# Patient Record
Sex: Female | Born: 1945
Health system: Southern US, Community
[De-identification: ages and names within clinical notes are randomized; demographics above are authoritative.]

## PROBLEM LIST (undated history)

## (undated) DIAGNOSIS — I10 Essential (primary) hypertension: Secondary | ICD-10-CM

## (undated) DIAGNOSIS — H269 Unspecified cataract: Secondary | ICD-10-CM

## (undated) DIAGNOSIS — J329 Chronic sinusitis, unspecified: Secondary | ICD-10-CM

## (undated) DIAGNOSIS — F32A Depression, unspecified: Secondary | ICD-10-CM

## (undated) DIAGNOSIS — J309 Allergic rhinitis, unspecified: Secondary | ICD-10-CM

## (undated) DIAGNOSIS — E559 Vitamin D deficiency, unspecified: Secondary | ICD-10-CM

## (undated) DIAGNOSIS — F419 Anxiety disorder, unspecified: Secondary | ICD-10-CM

## (undated) DIAGNOSIS — M1990 Unspecified osteoarthritis, unspecified site: Secondary | ICD-10-CM

## (undated) DIAGNOSIS — B019 Varicella without complication: Secondary | ICD-10-CM

## (undated) DIAGNOSIS — F329 Major depressive disorder, single episode, unspecified: Secondary | ICD-10-CM

## (undated) DIAGNOSIS — E785 Hyperlipidemia, unspecified: Secondary | ICD-10-CM

## (undated) DIAGNOSIS — J45909 Unspecified asthma, uncomplicated: Secondary | ICD-10-CM

## (undated) HISTORY — PX: TRIGGER FINGER RELEASE: SHX641

## (undated) HISTORY — DX: Varicella without complication: B01.9

## (undated) HISTORY — PX: WISDOM TOOTH EXTRACTION: SHX21

---

## 1998-03-05 ENCOUNTER — Other Ambulatory Visit: Admission: RE | Admit: 1998-03-05 | Discharge: 1998-03-05 | Payer: Self-pay | Admitting: Family Medicine

## 2000-09-30 ENCOUNTER — Other Ambulatory Visit: Admission: RE | Admit: 2000-09-30 | Discharge: 2000-09-30 | Payer: Self-pay | Admitting: Family Medicine

## 2000-10-13 ENCOUNTER — Encounter: Admission: RE | Admit: 2000-10-13 | Discharge: 2000-10-13 | Payer: Self-pay | Admitting: Family Medicine

## 2000-10-13 ENCOUNTER — Encounter: Payer: Self-pay | Admitting: Family Medicine

## 2000-10-18 ENCOUNTER — Encounter: Admission: RE | Admit: 2000-10-18 | Discharge: 2000-10-18 | Payer: Self-pay | Admitting: Family Medicine

## 2000-10-18 ENCOUNTER — Encounter: Payer: Self-pay | Admitting: Family Medicine

## 2002-06-13 ENCOUNTER — Other Ambulatory Visit: Admission: RE | Admit: 2002-06-13 | Discharge: 2002-06-13 | Payer: Self-pay | Admitting: Family Medicine

## 2006-06-20 ENCOUNTER — Ambulatory Visit (HOSPITAL_COMMUNITY): Admission: RE | Admit: 2006-06-20 | Discharge: 2006-06-20 | Payer: Self-pay | Admitting: Emergency Medicine

## 2006-06-20 ENCOUNTER — Encounter: Payer: Self-pay | Admitting: Vascular Surgery

## 2007-10-16 ENCOUNTER — Encounter: Admission: RE | Admit: 2007-10-16 | Discharge: 2007-10-16 | Payer: Self-pay | Admitting: Emergency Medicine

## 2007-10-17 ENCOUNTER — Encounter: Admission: RE | Admit: 2007-10-17 | Discharge: 2007-10-17 | Payer: Self-pay | Admitting: Sports Medicine

## 2010-06-24 ENCOUNTER — Other Ambulatory Visit
Admission: RE | Admit: 2010-06-24 | Discharge: 2010-06-24 | Payer: Self-pay | Source: Home / Self Care | Admitting: Family Medicine

## 2010-06-24 ENCOUNTER — Other Ambulatory Visit: Payer: Self-pay | Admitting: Family Medicine

## 2011-09-17 LAB — HM COLONOSCOPY

## 2011-10-06 ENCOUNTER — Other Ambulatory Visit: Payer: Self-pay | Admitting: Gastroenterology

## 2012-02-11 ENCOUNTER — Other Ambulatory Visit: Payer: Self-pay | Admitting: Family Medicine

## 2012-02-11 ENCOUNTER — Ambulatory Visit
Admission: RE | Admit: 2012-02-11 | Discharge: 2012-02-11 | Disposition: A | Discharge status: Home / Self Care | Payer: Medicare Other | Source: Ambulatory Visit | Attending: Family Medicine | Admitting: Family Medicine

## 2012-02-11 DIAGNOSIS — R221 Localized swelling, mass and lump, neck: Secondary | ICD-10-CM

## 2012-02-11 DIAGNOSIS — R220 Localized swelling, mass and lump, head: Secondary | ICD-10-CM

## 2012-02-11 DIAGNOSIS — M542 Cervicalgia: Secondary | ICD-10-CM

## 2012-02-29 ENCOUNTER — Ambulatory Visit: Payer: Medicare Other

## 2012-03-07 ENCOUNTER — Ambulatory Visit: Payer: Medicare Other | Attending: Family Medicine

## 2012-03-07 DIAGNOSIS — M25519 Pain in unspecified shoulder: Secondary | ICD-10-CM | POA: Insufficient documentation

## 2012-03-07 DIAGNOSIS — IMO0001 Reserved for inherently not codable concepts without codable children: Secondary | ICD-10-CM | POA: Insufficient documentation

## 2012-03-07 DIAGNOSIS — M542 Cervicalgia: Secondary | ICD-10-CM | POA: Insufficient documentation

## 2012-03-16 ENCOUNTER — Ambulatory Visit: Payer: Medicare Other

## 2012-03-23 ENCOUNTER — Ambulatory Visit: Payer: Medicare Other | Admitting: Physical Therapy

## 2012-03-30 ENCOUNTER — Ambulatory Visit: Payer: Medicare Other

## 2013-04-16 ENCOUNTER — Other Ambulatory Visit: Payer: Self-pay | Admitting: Gastroenterology

## 2013-04-16 ENCOUNTER — Ambulatory Visit
Admission: RE | Admit: 2013-04-16 | Discharge: 2013-04-16 | Disposition: A | Discharge status: Home / Self Care | Payer: Medicare Other | Source: Ambulatory Visit | Attending: Gastroenterology | Admitting: Gastroenterology

## 2013-04-16 DIAGNOSIS — R05 Cough: Secondary | ICD-10-CM

## 2014-10-22 DIAGNOSIS — Z0000 Encounter for general adult medical examination without abnormal findings: Secondary | ICD-10-CM | POA: Diagnosis not present

## 2014-10-22 DIAGNOSIS — E785 Hyperlipidemia, unspecified: Secondary | ICD-10-CM | POA: Diagnosis not present

## 2014-10-22 DIAGNOSIS — I10 Essential (primary) hypertension: Secondary | ICD-10-CM | POA: Diagnosis not present

## 2014-10-22 DIAGNOSIS — M810 Age-related osteoporosis without current pathological fracture: Secondary | ICD-10-CM | POA: Diagnosis not present

## 2014-10-22 DIAGNOSIS — F329 Major depressive disorder, single episode, unspecified: Secondary | ICD-10-CM | POA: Diagnosis not present

## 2014-10-22 DIAGNOSIS — Z23 Encounter for immunization: Secondary | ICD-10-CM | POA: Diagnosis not present

## 2014-11-15 DIAGNOSIS — R7989 Other specified abnormal findings of blood chemistry: Secondary | ICD-10-CM | POA: Diagnosis not present

## 2014-11-15 DIAGNOSIS — F331 Major depressive disorder, recurrent, moderate: Secondary | ICD-10-CM | POA: Diagnosis not present

## 2014-11-15 DIAGNOSIS — R5383 Other fatigue: Secondary | ICD-10-CM | POA: Diagnosis not present

## 2014-11-15 DIAGNOSIS — F4320 Adjustment disorder, unspecified: Secondary | ICD-10-CM | POA: Diagnosis not present

## 2014-11-27 ENCOUNTER — Other Ambulatory Visit: Payer: Self-pay | Admitting: Family Medicine

## 2014-11-27 ENCOUNTER — Ambulatory Visit
Admission: RE | Admit: 2014-11-27 | Discharge: 2014-11-27 | Disposition: A | Discharge status: Home / Self Care | Payer: Commercial Managed Care - HMO | Source: Ambulatory Visit | Attending: Family Medicine | Admitting: Family Medicine

## 2014-11-27 DIAGNOSIS — R059 Cough, unspecified: Secondary | ICD-10-CM

## 2014-11-27 DIAGNOSIS — R05 Cough: Secondary | ICD-10-CM | POA: Diagnosis not present

## 2014-11-27 DIAGNOSIS — R748 Abnormal levels of other serum enzymes: Secondary | ICD-10-CM | POA: Diagnosis not present

## 2014-11-27 DIAGNOSIS — F329 Major depressive disorder, single episode, unspecified: Secondary | ICD-10-CM | POA: Diagnosis not present

## 2014-11-27 DIAGNOSIS — R630 Anorexia: Secondary | ICD-10-CM | POA: Diagnosis not present

## 2014-12-02 ENCOUNTER — Ambulatory Visit
Admission: RE | Admit: 2014-12-02 | Discharge: 2014-12-02 | Disposition: A | Discharge status: Home / Self Care | Payer: Commercial Managed Care - HMO | Source: Ambulatory Visit | Attending: Family Medicine | Admitting: Family Medicine

## 2014-12-02 DIAGNOSIS — R748 Abnormal levels of other serum enzymes: Secondary | ICD-10-CM | POA: Diagnosis not present

## 2014-12-25 DIAGNOSIS — R42 Dizziness and giddiness: Secondary | ICD-10-CM | POA: Diagnosis not present

## 2014-12-25 DIAGNOSIS — R1030 Lower abdominal pain, unspecified: Secondary | ICD-10-CM | POA: Diagnosis not present

## 2014-12-25 DIAGNOSIS — F329 Major depressive disorder, single episode, unspecified: Secondary | ICD-10-CM | POA: Diagnosis not present

## 2014-12-25 DIAGNOSIS — F419 Anxiety disorder, unspecified: Secondary | ICD-10-CM | POA: Diagnosis not present

## 2015-01-01 ENCOUNTER — Other Ambulatory Visit: Payer: Self-pay | Admitting: Family Medicine

## 2015-01-01 DIAGNOSIS — R1030 Lower abdominal pain, unspecified: Secondary | ICD-10-CM

## 2015-02-21 DIAGNOSIS — H524 Presbyopia: Secondary | ICD-10-CM | POA: Diagnosis not present

## 2015-02-21 DIAGNOSIS — H5210 Myopia, unspecified eye: Secondary | ICD-10-CM | POA: Diagnosis not present

## 2015-04-04 DIAGNOSIS — I10 Essential (primary) hypertension: Secondary | ICD-10-CM | POA: Diagnosis not present

## 2015-04-04 DIAGNOSIS — R109 Unspecified abdominal pain: Secondary | ICD-10-CM | POA: Diagnosis not present

## 2015-04-04 DIAGNOSIS — R42 Dizziness and giddiness: Secondary | ICD-10-CM | POA: Diagnosis not present

## 2015-04-04 DIAGNOSIS — R51 Headache: Secondary | ICD-10-CM | POA: Diagnosis not present

## 2015-04-04 DIAGNOSIS — R413 Other amnesia: Secondary | ICD-10-CM | POA: Diagnosis not present

## 2015-04-04 DIAGNOSIS — E559 Vitamin D deficiency, unspecified: Secondary | ICD-10-CM | POA: Diagnosis not present

## 2015-04-04 DIAGNOSIS — E785 Hyperlipidemia, unspecified: Secondary | ICD-10-CM | POA: Diagnosis not present

## 2015-04-04 DIAGNOSIS — Z23 Encounter for immunization: Secondary | ICD-10-CM | POA: Diagnosis not present

## 2015-04-04 DIAGNOSIS — F329 Major depressive disorder, single episode, unspecified: Secondary | ICD-10-CM | POA: Diagnosis not present

## 2015-05-13 DIAGNOSIS — F329 Major depressive disorder, single episode, unspecified: Secondary | ICD-10-CM | POA: Diagnosis not present

## 2015-05-13 DIAGNOSIS — H2512 Age-related nuclear cataract, left eye: Secondary | ICD-10-CM | POA: Diagnosis not present

## 2015-05-13 DIAGNOSIS — F419 Anxiety disorder, unspecified: Secondary | ICD-10-CM | POA: Diagnosis not present

## 2015-05-13 DIAGNOSIS — H2511 Age-related nuclear cataract, right eye: Secondary | ICD-10-CM | POA: Diagnosis not present

## 2015-05-13 DIAGNOSIS — H25011 Cortical age-related cataract, right eye: Secondary | ICD-10-CM | POA: Diagnosis not present

## 2015-05-13 DIAGNOSIS — I10 Essential (primary) hypertension: Secondary | ICD-10-CM | POA: Diagnosis not present

## 2015-05-13 DIAGNOSIS — R0602 Shortness of breath: Secondary | ICD-10-CM | POA: Diagnosis not present

## 2015-05-13 DIAGNOSIS — H25012 Cortical age-related cataract, left eye: Secondary | ICD-10-CM | POA: Diagnosis not present

## 2015-05-22 DIAGNOSIS — J329 Chronic sinusitis, unspecified: Secondary | ICD-10-CM | POA: Diagnosis not present

## 2015-06-08 HISTORY — PX: CATARACT EXTRACTION, BILATERAL: SHX1313

## 2015-06-10 ENCOUNTER — Other Ambulatory Visit: Payer: Self-pay | Admitting: Family Medicine

## 2015-06-10 DIAGNOSIS — D3002 Benign neoplasm of left kidney: Secondary | ICD-10-CM

## 2015-06-13 DIAGNOSIS — J069 Acute upper respiratory infection, unspecified: Secondary | ICD-10-CM | POA: Diagnosis not present

## 2015-06-16 DIAGNOSIS — J329 Chronic sinusitis, unspecified: Secondary | ICD-10-CM | POA: Diagnosis not present

## 2015-07-07 DIAGNOSIS — H25811 Combined forms of age-related cataract, right eye: Secondary | ICD-10-CM | POA: Diagnosis not present

## 2015-07-07 DIAGNOSIS — H5211 Myopia, right eye: Secondary | ICD-10-CM | POA: Diagnosis not present

## 2015-07-07 DIAGNOSIS — H5201 Hypermetropia, right eye: Secondary | ICD-10-CM | POA: Diagnosis not present

## 2015-07-07 DIAGNOSIS — Z961 Presence of intraocular lens: Secondary | ICD-10-CM | POA: Diagnosis not present

## 2015-07-07 DIAGNOSIS — H2511 Age-related nuclear cataract, right eye: Secondary | ICD-10-CM | POA: Diagnosis not present

## 2015-07-08 DIAGNOSIS — H2512 Age-related nuclear cataract, left eye: Secondary | ICD-10-CM | POA: Diagnosis not present

## 2015-07-09 HISTORY — PX: CATARACT EXTRACTION, BILATERAL: SHX1313

## 2015-07-15 DIAGNOSIS — R69 Illness, unspecified: Secondary | ICD-10-CM | POA: Diagnosis not present

## 2015-07-17 ENCOUNTER — Ambulatory Visit
Admission: RE | Admit: 2015-07-17 | Discharge: 2015-07-17 | Disposition: A | Discharge status: Home / Self Care | Payer: Commercial Managed Care - HMO | Source: Ambulatory Visit | Attending: Family Medicine | Admitting: Family Medicine

## 2015-07-17 DIAGNOSIS — N2889 Other specified disorders of kidney and ureter: Secondary | ICD-10-CM | POA: Diagnosis not present

## 2015-07-17 DIAGNOSIS — D3002 Benign neoplasm of left kidney: Secondary | ICD-10-CM

## 2015-07-28 DIAGNOSIS — H25812 Combined forms of age-related cataract, left eye: Secondary | ICD-10-CM | POA: Diagnosis not present

## 2015-07-28 DIAGNOSIS — H524 Presbyopia: Secondary | ICD-10-CM | POA: Diagnosis not present

## 2015-07-28 DIAGNOSIS — H2512 Age-related nuclear cataract, left eye: Secondary | ICD-10-CM | POA: Diagnosis not present

## 2015-07-28 DIAGNOSIS — H5201 Hypermetropia, right eye: Secondary | ICD-10-CM | POA: Diagnosis not present

## 2015-07-28 DIAGNOSIS — H5212 Myopia, left eye: Secondary | ICD-10-CM | POA: Diagnosis not present

## 2015-07-28 DIAGNOSIS — Z961 Presence of intraocular lens: Secondary | ICD-10-CM | POA: Diagnosis not present

## 2015-08-04 DIAGNOSIS — H524 Presbyopia: Secondary | ICD-10-CM | POA: Diagnosis not present

## 2015-08-04 DIAGNOSIS — Z961 Presence of intraocular lens: Secondary | ICD-10-CM | POA: Diagnosis not present

## 2015-08-04 DIAGNOSIS — H5201 Hypermetropia, right eye: Secondary | ICD-10-CM | POA: Diagnosis not present

## 2015-08-19 DIAGNOSIS — H5203 Hypermetropia, bilateral: Secondary | ICD-10-CM | POA: Diagnosis not present

## 2015-08-19 DIAGNOSIS — H5212 Myopia, left eye: Secondary | ICD-10-CM | POA: Diagnosis not present

## 2015-08-19 DIAGNOSIS — Z961 Presence of intraocular lens: Secondary | ICD-10-CM | POA: Diagnosis not present

## 2015-08-19 DIAGNOSIS — R69 Illness, unspecified: Secondary | ICD-10-CM | POA: Diagnosis not present

## 2015-08-19 DIAGNOSIS — H524 Presbyopia: Secondary | ICD-10-CM | POA: Diagnosis not present

## 2015-12-08 ENCOUNTER — Telehealth: Payer: Self-pay | Admitting: Cardiology

## 2015-12-08 DIAGNOSIS — L299 Pruritus, unspecified: Secondary | ICD-10-CM | POA: Diagnosis not present

## 2015-12-08 DIAGNOSIS — R5383 Other fatigue: Secondary | ICD-10-CM | POA: Diagnosis not present

## 2015-12-08 DIAGNOSIS — L309 Dermatitis, unspecified: Secondary | ICD-10-CM | POA: Diagnosis not present

## 2015-12-08 NOTE — Telephone Encounter (Signed)
Received records from [place] for appointment on 02/19/16 with Dr Ellyn Hack.  Records given to [facility] (medical records) for Dr Allison Quarry schedule on 02/19/16. lp

## 2015-12-10 DIAGNOSIS — L309 Dermatitis, unspecified: Secondary | ICD-10-CM | POA: Diagnosis not present

## 2015-12-15 DIAGNOSIS — L309 Dermatitis, unspecified: Secondary | ICD-10-CM | POA: Diagnosis not present

## 2015-12-15 DIAGNOSIS — L821 Other seborrheic keratosis: Secondary | ICD-10-CM | POA: Diagnosis not present

## 2015-12-18 ENCOUNTER — Observation Stay (HOSPITAL_COMMUNITY)
Admission: EM | Admit: 2015-12-18 | Discharge: 2015-12-20 | Disposition: A | Discharge status: Home / Self Care | Payer: Commercial Managed Care - HMO | Attending: Internal Medicine | Admitting: Internal Medicine

## 2015-12-18 ENCOUNTER — Encounter (HOSPITAL_COMMUNITY): Payer: Self-pay | Admitting: Emergency Medicine

## 2015-12-18 ENCOUNTER — Observation Stay (HOSPITAL_COMMUNITY): Payer: Commercial Managed Care - HMO

## 2015-12-18 ENCOUNTER — Emergency Department (HOSPITAL_COMMUNITY): Payer: Commercial Managed Care - HMO

## 2015-12-18 DIAGNOSIS — R079 Chest pain, unspecified: Secondary | ICD-10-CM | POA: Diagnosis present

## 2015-12-18 DIAGNOSIS — E785 Hyperlipidemia, unspecified: Secondary | ICD-10-CM | POA: Diagnosis present

## 2015-12-18 DIAGNOSIS — R0789 Other chest pain: Secondary | ICD-10-CM | POA: Diagnosis not present

## 2015-12-18 DIAGNOSIS — K219 Gastro-esophageal reflux disease without esophagitis: Secondary | ICD-10-CM | POA: Diagnosis not present

## 2015-12-18 DIAGNOSIS — N183 Chronic kidney disease, stage 3 (moderate): Secondary | ICD-10-CM | POA: Insufficient documentation

## 2015-12-18 DIAGNOSIS — I200 Unstable angina: Secondary | ICD-10-CM | POA: Diagnosis not present

## 2015-12-18 DIAGNOSIS — Z7982 Long term (current) use of aspirin: Secondary | ICD-10-CM | POA: Insufficient documentation

## 2015-12-18 DIAGNOSIS — F419 Anxiety disorder, unspecified: Secondary | ICD-10-CM | POA: Insufficient documentation

## 2015-12-18 DIAGNOSIS — R21 Rash and other nonspecific skin eruption: Secondary | ICD-10-CM | POA: Diagnosis present

## 2015-12-18 DIAGNOSIS — J45909 Unspecified asthma, uncomplicated: Secondary | ICD-10-CM | POA: Diagnosis present

## 2015-12-18 DIAGNOSIS — I10 Essential (primary) hypertension: Secondary | ICD-10-CM | POA: Diagnosis present

## 2015-12-18 DIAGNOSIS — I129 Hypertensive chronic kidney disease with stage 1 through stage 4 chronic kidney disease, or unspecified chronic kidney disease: Secondary | ICD-10-CM | POA: Diagnosis not present

## 2015-12-18 DIAGNOSIS — Z79899 Other long term (current) drug therapy: Secondary | ICD-10-CM | POA: Insufficient documentation

## 2015-12-18 DIAGNOSIS — I2510 Atherosclerotic heart disease of native coronary artery without angina pectoris: Secondary | ICD-10-CM | POA: Insufficient documentation

## 2015-12-18 DIAGNOSIS — Z9114 Patient's other noncompliance with medication regimen: Secondary | ICD-10-CM | POA: Insufficient documentation

## 2015-12-18 DIAGNOSIS — F329 Major depressive disorder, single episode, unspecified: Secondary | ICD-10-CM | POA: Insufficient documentation

## 2015-12-18 DIAGNOSIS — M1990 Unspecified osteoarthritis, unspecified site: Secondary | ICD-10-CM | POA: Diagnosis present

## 2015-12-18 DIAGNOSIS — Z7722 Contact with and (suspected) exposure to environmental tobacco smoke (acute) (chronic): Secondary | ICD-10-CM | POA: Diagnosis not present

## 2015-12-18 DIAGNOSIS — R0602 Shortness of breath: Secondary | ICD-10-CM | POA: Diagnosis not present

## 2015-12-18 DIAGNOSIS — R5383 Other fatigue: Secondary | ICD-10-CM | POA: Diagnosis present

## 2015-12-18 DIAGNOSIS — Z8249 Family history of ischemic heart disease and other diseases of the circulatory system: Secondary | ICD-10-CM | POA: Diagnosis not present

## 2015-12-18 HISTORY — DX: Allergic rhinitis, unspecified: J30.9

## 2015-12-18 HISTORY — DX: Essential (primary) hypertension: I10

## 2015-12-18 HISTORY — DX: Hyperlipidemia, unspecified: E78.5

## 2015-12-18 HISTORY — DX: Unspecified osteoarthritis, unspecified site: M19.90

## 2015-12-18 HISTORY — DX: Unspecified cataract: H26.9

## 2015-12-18 HISTORY — DX: Vitamin D deficiency, unspecified: E55.9

## 2015-12-18 HISTORY — DX: Chronic sinusitis, unspecified: J32.9

## 2015-12-18 HISTORY — DX: Depression, unspecified: F32.A

## 2015-12-18 HISTORY — DX: Anxiety disorder, unspecified: F41.9

## 2015-12-18 HISTORY — DX: Major depressive disorder, single episode, unspecified: F32.9

## 2015-12-18 HISTORY — DX: Unspecified asthma, uncomplicated: J45.909

## 2015-12-18 LAB — BASIC METABOLIC PANEL
ANION GAP: 10 (ref 5–15)
ANION GAP: 13 (ref 5–15)
BUN: 12 mg/dL (ref 6–20)
BUN: 15 mg/dL (ref 6–20)
CALCIUM: 9.4 mg/dL (ref 8.9–10.3)
CHLORIDE: 99 mmol/L — AB (ref 101–111)
CO2: 23 mmol/L (ref 22–32)
CO2: 20 mmol/L — AB (ref 22–32)
Calcium: 9.3 mg/dL (ref 8.9–10.3)
Chloride: 104 mmol/L (ref 101–111)
Creatinine, Ser: 0.96 mg/dL (ref 0.44–1.00)
Creatinine, Ser: 1.2 mg/dL — ABNORMAL HIGH (ref 0.44–1.00)
GFR calc non Af Amer: 44 mL/min — ABNORMAL LOW (ref >60)
GFR, EST AFRICAN AMERICAN: 51 mL/min — AB (ref >60)
GFR, EST NON AFRICAN AMERICAN: 58 mL/min — AB (ref >60)
Glucose, Bld: 97 mg/dL (ref 65–99)
Glucose, Bld: 164 mg/dL — ABNORMAL HIGH (ref 65–99)
POTASSIUM: 4 mmol/L (ref 3.5–5.1)
Potassium: 4.3 mmol/L (ref 3.5–5.1)
SODIUM: 132 mmol/L — AB (ref 135–145)
Sodium: 137 mmol/L (ref 135–145)

## 2015-12-18 LAB — CBC
HCT: 41.9 % (ref 36.0–46.0)
HEMATOCRIT: 38.5 % (ref 36.0–46.0)
HEMOGLOBIN: 14 g/dL (ref 12.0–15.0)
HEMOGLOBIN: 13.1 g/dL (ref 12.0–15.0)
MCH: 30.2 pg (ref 26.0–34.0)
MCH: 30.7 pg (ref 26.0–34.0)
MCHC: 33.4 g/dL (ref 30.0–36.0)
MCHC: 34 g/dL (ref 30.0–36.0)
MCV: 90.3 fL (ref 78.0–100.0)
MCV: 90.2 fL (ref 78.0–100.0)
Platelets: 307 10*3/uL (ref 150–400)
Platelets: 290 10*3/uL (ref 150–400)
RBC: 4.64 MIL/uL (ref 3.87–5.11)
RBC: 4.27 MIL/uL (ref 3.87–5.11)
RDW: 13.4 % (ref 11.5–15.5)
RDW: 13.5 % (ref 11.5–15.5)
WBC: 10.1 10*3/uL (ref 4.0–10.5)
WBC: 16.6 10*3/uL — AB (ref 4.0–10.5)

## 2015-12-18 LAB — TROPONIN I: TROPONIN I: 0.03 ng/mL — AB (ref <0.03)

## 2015-12-18 LAB — PROTIME-INR
INR: 1.15 (ref 0.00–1.49)
PROTHROMBIN TIME: 14.9 s (ref 11.6–15.2)

## 2015-12-18 LAB — I-STAT TROPONIN, ED: TROPONIN I, POC: 0 ng/mL (ref 0.00–0.08)

## 2015-12-18 LAB — BRAIN NATRIURETIC PEPTIDE: B Natriuretic Peptide: 17.5 pg/mL (ref 0.0–100.0)

## 2015-12-18 MED ORDER — HYPROMELLOSE (GONIOSCOPIC) 2.5 % OP SOLN
1.0000 [drp] | Freq: Every day | OPHTHALMIC | Status: DC
  Administered 2015-12-18: 1 [drp] via OPHTHALMIC
  Filled 2015-12-18: qty 15

## 2015-12-18 MED ORDER — MORPHINE SULFATE (PF) 2 MG/ML IV SOLN: 0.5000 mg | INTRAVENOUS | Status: DC | PRN

## 2015-12-18 MED ORDER — METHYLPREDNISOLONE SODIUM SUCC 125 MG IJ SOLR
125.0000 mg | Freq: Once | INTRAMUSCULAR | Status: AC
  Administered 2015-12-18: 125 mg via INTRAVENOUS
  Filled 2015-12-18: qty 2

## 2015-12-18 MED ORDER — ATORVASTATIN CALCIUM 20 MG PO TABS
20.0000 mg | ORAL_TABLET | Freq: Every day | ORAL | Status: DC
  Administered 2015-12-18: 20 mg via ORAL
  Filled 2015-12-18 (×2): qty 1

## 2015-12-18 MED ORDER — FLUOXETINE HCL 20 MG PO CAPS
40.0000 mg | ORAL_CAPSULE | Freq: Every day | ORAL | Status: DC
  Administered 2015-12-18 – 2015-12-20 (×3): 40 mg via ORAL
  Filled 2015-12-18 (×4): qty 2

## 2015-12-18 MED ORDER — ASPIRIN 81 MG PO CHEW
81.0000 mg | CHEWABLE_TABLET | ORAL | Status: AC
  Administered 2015-12-19: 81 mg via ORAL
  Filled 2015-12-18: qty 1

## 2015-12-18 MED ORDER — BUPROPION HCL ER (XL) 300 MG PO TB24
300.0000 mg | ORAL_TABLET | Freq: Every day | ORAL | Status: DC
  Administered 2015-12-18 – 2015-12-20 (×3): 300 mg via ORAL
  Filled 2015-12-18 (×3): qty 1

## 2015-12-18 MED ORDER — ONDANSETRON HCL 4 MG/2ML IJ SOLN
4.0000 mg | Freq: Once | INTRAMUSCULAR | Status: AC
  Administered 2015-12-18: 4 mg via INTRAVENOUS
  Filled 2015-12-18: qty 2

## 2015-12-18 MED ORDER — ALBUTEROL SULFATE (2.5 MG/3ML) 0.083% IN NEBU: 2.5000 mg | INHALATION_SOLUTION | RESPIRATORY_TRACT | Status: AC | PRN

## 2015-12-18 MED ORDER — ENOXAPARIN SODIUM 40 MG/0.4ML ~~LOC~~ SOLN
40.0000 mg | SUBCUTANEOUS | Status: DC
  Administered 2015-12-18 – 2015-12-19 (×2): 40 mg via SUBCUTANEOUS
  Filled 2015-12-18 (×2): qty 0.4

## 2015-12-18 MED ORDER — ASPIRIN EC 325 MG PO TBEC
325.0000 mg | DELAYED_RELEASE_TABLET | Freq: Every day | ORAL | Status: DC
  Administered 2015-12-18: 325 mg via ORAL
  Filled 2015-12-18 (×3): qty 1

## 2015-12-18 MED ORDER — SODIUM CHLORIDE 0.9% FLUSH
3.0000 mL | Freq: Two times a day (BID) | INTRAVENOUS | Status: DC
  Administered 2015-12-18 – 2015-12-19 (×2): 3 mL via INTRAVENOUS

## 2015-12-18 MED ORDER — ADULT MULTIVITAMIN W/MINERALS CH
1.0000 | ORAL_TABLET | Freq: Every day | ORAL | Status: DC
  Administered 2015-12-18 – 2015-12-20 (×3): 1 via ORAL
  Filled 2015-12-18 (×3): qty 1

## 2015-12-18 MED ORDER — NITROGLYCERIN 0.4 MG SL SUBL
0.4000 mg | SUBLINGUAL_TABLET | SUBLINGUAL | Status: DC | PRN
  Administered 2015-12-18 (×2): 0.4 mg via SUBLINGUAL
  Filled 2015-12-18: qty 1

## 2015-12-18 MED ORDER — GI COCKTAIL ~~LOC~~
30.0000 mL | Freq: Once | ORAL | Status: AC
  Administered 2015-12-18: 30 mL via ORAL
  Filled 2015-12-18: qty 30

## 2015-12-18 MED ORDER — ACETAMINOPHEN 325 MG PO TABS
650.0000 mg | ORAL_TABLET | ORAL | Status: DC | PRN
  Administered 2015-12-20: 650 mg via ORAL
  Filled 2015-12-18: qty 2

## 2015-12-18 MED ORDER — SODIUM CHLORIDE 0.9% FLUSH: 3.0000 mL | INTRAVENOUS | Status: DC | PRN

## 2015-12-18 MED ORDER — AMLODIPINE BESYLATE 2.5 MG PO TABS
2.5000 mg | ORAL_TABLET | Freq: Every day | ORAL | Status: DC
  Administered 2015-12-18 – 2015-12-20 (×3): 2.5 mg via ORAL
  Filled 2015-12-18 (×3): qty 1

## 2015-12-18 MED ORDER — ALBUTEROL (5 MG/ML) CONTINUOUS INHALATION SOLN
10.0000 mg/h | INHALATION_SOLUTION | RESPIRATORY_TRACT | Status: AC
  Administered 2015-12-18: 10 mg/h via RESPIRATORY_TRACT
  Filled 2015-12-18 (×2): qty 20

## 2015-12-18 MED ORDER — ALBUTEROL SULFATE HFA 108 (90 BASE) MCG/ACT IN AERS: 2.0000 | INHALATION_SPRAY | Freq: Four times a day (QID) | RESPIRATORY_TRACT | Status: DC | PRN

## 2015-12-18 MED ORDER — SODIUM CHLORIDE 0.9 % IV BOLUS (SEPSIS)
250.0000 mL | Freq: Once | INTRAVENOUS | Status: AC
  Administered 2015-12-18: 250 mL via INTRAVENOUS

## 2015-12-18 MED ORDER — CARBOXYMETHYLCELLULOSE SODIUM 0.5 % OP SOLN: 1.0000 [drp] | Freq: Every day | OPHTHALMIC | Status: DC

## 2015-12-18 MED ORDER — ONDANSETRON HCL 4 MG/2ML IJ SOLN: 4.0000 mg | Freq: Four times a day (QID) | INTRAMUSCULAR | Status: DC | PRN

## 2015-12-18 MED ORDER — SODIUM CHLORIDE 0.9 % IV SOLN: 250.0000 mL | INTRAVENOUS | Status: DC | PRN

## 2015-12-18 MED ORDER — SODIUM CHLORIDE 0.9 % WEIGHT BASED INFUSION
1.0000 mL/kg/h | INTRAVENOUS | Status: DC
  Administered 2015-12-18 – 2015-12-19 (×2): 1 mL/kg/h via INTRAVENOUS

## 2015-12-18 MED ORDER — MECLIZINE HCL 25 MG PO TABS: 25.0000 mg | ORAL_TABLET | Freq: Three times a day (TID) | ORAL | Status: DC | PRN

## 2015-12-18 MED ORDER — GI COCKTAIL ~~LOC~~
30.0000 mL | Freq: Four times a day (QID) | ORAL | Status: DC | PRN
  Filled 2015-12-18: qty 30

## 2015-12-18 MED ORDER — ALPRAZOLAM 0.25 MG PO TABS: 0.2500 mg | ORAL_TABLET | Freq: Two times a day (BID) | ORAL | Status: DC | PRN

## 2015-12-18 MED ORDER — PANTOPRAZOLE SODIUM 40 MG PO TBEC
40.0000 mg | DELAYED_RELEASE_TABLET | Freq: Every day | ORAL | Status: DC
  Administered 2015-12-18 – 2015-12-20 (×3): 40 mg via ORAL
  Filled 2015-12-18 (×3): qty 1

## 2015-12-18 NOTE — H&P (Signed)
History and Physical    Brenda Bautista [id number] DOB: 15-Oct-1945 DOA: 12/18/2015   PCP: Lujean Amel, MD   Patient coming from/Resides with: Private residence/lives alone  Chief Complaint: Chest pain  HPI: Brenda Bautista is a 70 y.o. female with medical history significant for hypertension, asthma, osteoarthritis, GERD and dyslipidemia who presents to the hospital complaining of left-sided chest pain ongoing for about 3-4 weeks. Patient has been having chest pain under the left breast radiating to the left axilla down the left arm and into the left neck as well as through her back for about 3-4 weeks. Episodes of chest pain and been associated with dizziness and diaphoresis as well as progressive shortness of breath. Chest pain duration is anywhere from a few minutes to several hours with no particular pattern. In addition more recently patient has had a bitter taste to her mouth. She denies any potential injury or recent issues with lifting a heavy item. She is not had any typical asthma symptoms such as wheezing or coughing or fevers or chills. Patient is currently chest pain-free after receiving nitroglycerin in the ER. Of note patient had significant drop in blood pressure after administration of nitroglycerin requiring a 250 mL normal saline bolus. Patient reports is not a good drinker of fluids at home.  ED Course:  Vital signs: PO temp 90.8-BP 154/101-pulse 61-respirations 11-room air saturations 93% 2 view chest x-ray: Cardiomegaly without evidence of acute cardiopulmonary disease Lab data: Sodium 137, potassium 4.3, BUN 12, creatinine 0.96, BNP 17.5, poc troponin less than 0.00, troponin less than 0.03, white count normal differential not obtained, hemoglobin 14, platelets 307,000, glucose 97 Medications and treatments: Continuous Proventil neb 10 mg per hour 1, sublingual nitroglycerin 0.4 mg 2, Solu-Medrol 125 mg IV 1, Zofran 4 mg IV 1   Review of Systems:  In  addition to the HPI above,  No Fever-chills, myalgias or other constitutional symptoms No Headache, changes with Vision or hearing, new weakness, tingling, numbness in any extremity, No problems swallowing food or Liquids, indigestion/reflux although recently has reported bitter taste in her mouth No Cough or palpitations, orthopnea  No Abdominal pain, N/V; no melena or hematochezia, no dark tarry stools No dysuria, hematuria or flank pain No new skin rashes, lesions, masses or bruises, No new joints pains-aches No recent weight gain or loss No polyuria, polydypsia or polyphagia,   Past Medical History  Diagnosis Date  . Hypertension   . Arthritis   . Asthma   . Hyperlipemia   . Depression   . Anxiety   . Cataract   . Allergic rhinitis   . Vitamin D deficiency   . Sinusitis     History reviewed. No pertinent past surgical history.  Social History   Social History  . Marital Status: Widowed     Spouse Name: N/A  . Number of Children: N/A  . Years of Education: N/A   Occupational History  . Not on file.   Social History Main Topics  . Smoking status: Never Smoker Although father was heavy smoker and has been smoked as well   . Smokeless tobacco: Never Used  . Alcohol Use: No  . Drug Use: No  . Sexual Activity: Not on file   Other Topics Concern  . Not on file   Social History Narrative  . No narrative on file    Mobility: Without assistive devices Work history: Homemaker   Allergies  Allergen Reactions  . Codeine   . Bactrim [Sulfamethoxazole-Trimethoprim] Rash  .  Flagyl [Metronidazole] Rash  . Meloxicam Other (See Comments)    Abdominal pain     No family history on file. Family history reviewed-Patient reports mother had myocardial infarction and subsequent CABG procedure in her 77s, also a pacemaker-mother was a smoker  Prior to Admission medications   Medication Sig Start Date End Date Taking? Authorizing Provider  albuterol (PROVENTIL  HFA;VENTOLIN HFA) 108 (90 Base) MCG/ACT inhaler Inhale 2 puffs into the lungs every 6 (six) hours as needed for wheezing or shortness of breath.   Yes Historical Provider, MD  ALPRAZolam (XANAX) 0.25 MG tablet Take 0.25 mg by mouth 2 (two) times daily as needed for anxiety.   Yes Historical Provider, MD  amLODipine (NORVASC) 2.5 MG tablet Take 2.5 mg by mouth daily.   Yes Historical Provider, MD  aspirin 81 MG tablet Take 81 mg by mouth daily.   Yes Historical Provider, MD  atorvastatin (LIPITOR) 20 MG tablet Take 20 mg by mouth daily.   Yes Historical Provider, MD  buPROPion (WELLBUTRIN XL) 300 MG 24 hr tablet Take 300 mg by mouth daily.   Yes Historical Provider, MD  carboxymethylcellulose (REFRESH PLUS) 0.5 % SOLN Place 1 drop into both eyes at bedtime.   Yes Historical Provider, MD  diphenhydrAMINE (BENADRYL) 25 MG tablet Take 25 mg by mouth every 6 (six) hours as needed.   Yes Historical Provider, MD  FLUoxetine (PROZAC) 40 MG capsule Take 40 mg by mouth daily.   Yes Historical Provider, MD  meclizine (ANTIVERT) 25 MG tablet Take 25 mg by mouth 3 (three) times daily as needed for dizziness.   Yes Historical Provider, MD  Multiple Vitamins-Minerals (MULTIVITAMIN WITH MINERALS) tablet Take 1 tablet by mouth daily.   Yes Historical Provider, MD  omeprazole (PRILOSEC) 20 MG capsule Take 20 mg by mouth daily.   Yes Historical Provider, MD    Physical Exam: Filed Vitals:   12/18/15 1300 12/18/15 1315 12/18/15 1330 12/18/15 1345  BP: 148/59 132/63 149/64 138/58  Pulse: 83 85 91 87  Temp:      TempSrc:      Resp: 14 18 16 22   SpO2: 94% 95% 95% 92%      Constitutional: NAD, calm, comfortable Eyes: PERRL, lids and conjunctivae normal ENMT: Mucous membranes are moist. Posterior pharynx clear of any exudate or lesions.Normal dentition.  Neck: normal, supple, no masses, no thyromegaly Respiratory: clear to auscultation bilaterally, no wheezing, no crackles. Normal respiratory effort. No  accessory muscle use. Left anterior chest wall tender to palpation Cardiovascular: Regular rate and rhythm, grade 2/6 systolic murmur right sternal border, no rubs / gallops. No extremity edema. 2+ pedal pulses. No carotid bruits.  Abdomen: no tenderness, no masses palpated. No hepatosplenomegaly. Bowel sounds positive.  Musculoskeletal: no clubbing / cyanosis. No joint deformity upper and lower extremities. Good ROM, no contractures. Normal muscle tone.  Skin: no rashes, lesions, ulcers. No induration Neurologic: CN 2-12 grossly intact. Sensation intact, DTR normal. Strength 5/5 x all 4 extremities.  Psychiatric: Normal judgment and insight. Alert and oriented x 3. Normal mood.    Labs on Admission: I have personally reviewed following labs and imaging studies  CBC:  Recent Labs Lab 12/18/15 1011  WBC 10.1  HGB 14.0  HCT 41.9  MCV 90.3  PLT [id number]   Basic Metabolic Panel:  Recent Labs Lab 12/18/15 1011  NA 137  K 4.3  CL 104  CO2 23  GLUCOSE 97  BUN 12  CREATININE 0.96  CALCIUM 9.4  GFR: CrCl cannot be calculated (Unknown ideal weight.). Liver Function Tests: No results for input(s): AST, ALT, ALKPHOS, BILITOT, PROT, ALBUMIN in the last 168 hours. No results for input(s): LIPASE, AMYLASE in the last 168 hours. No results for input(s): AMMONIA in the last 168 hours. Coagulation Profile: No results for input(s): INR, PROTIME in the last 168 hours. Cardiac Enzymes:  Recent Labs Lab 12/18/15 1315  TROPONINI <0.03   BNP (last 3 results) No results for input(s): PROBNP in the last 8760 hours. HbA1C: No results for input(s): HGBA1C in the last 72 hours. CBG: No results for input(s): GLUCAP in the last 168 hours. Lipid Profile: No results for input(s): CHOL, HDL, LDLCALC, TRIG, CHOLHDL, LDLDIRECT in the last 72 hours. Thyroid Function Tests: No results for input(s): TSH, T4TOTAL, FREET4, T3FREE, THYROIDAB in the last 72 hours. Anemia Panel: No results for  input(s): VITAMINB12, FOLATE, FERRITIN, TIBC, IRON, RETICCTPCT in the last 72 hours. Urine analysis: No results found for: COLORURINE, APPEARANCEUR, LABSPEC, PHURINE, GLUCOSEU, HGBUR, BILIRUBINUR, KETONESUR, PROTEINUR, UROBILINOGEN, NITRITE, LEUKOCYTESUR Sepsis Labs: @LABRCNTIP (procalcitonin:4,lacticidven:4) )No results found for this or any previous visit (from the past 240 hour(s)).   Radiological Exams on Admission: Dg Chest 2 View  12/18/2015  CLINICAL DATA:  70 year old female with left-sided chest pain for 2 weeks. Associated shortness of breath. EXAM: CHEST  2 VIEW COMPARISON:  11/27/2014 and prior chest radiographs FINDINGS: Cardiomegaly again noted. Mild interstitial prominence is unchanged. There is no evidence of focal airspace disease, pulmonary edema, suspicious pulmonary nodule/mass, pleural effusion, or pneumothorax. No acute bony abnormalities are identified. IMPRESSION: Cardiomegaly without evidence of acute cardiopulmonary disease. Electronically Signed   By: Margarette Canada M.D.   On: 12/18/2015 11:16    EKG: (Independently reviewed) normal sinus rhythm with a ventricular rate of 69 bpm, QTC 422 ms, no acute ischemic changes  Assessment/Plan Principal Problem:   Chest pain -Older female patient presenting with both typical and atypical chest pain symptoms with multiple risk factors including: Advanced age, female, dyslipidemia, hypertension, positive family history -Cardiology consulted -Cycle troponin noting initial troponin and EKG reassuring -Echocardiogram -Possible GI versus biliary etiology so we'll obtain abdominal ultrasound of right upper quadrant while nothing by mouth and give one-time dose of GI cocktail -Myoview stress test has been scheduled tentatively for a.m. as long as enzymes are negative -Full dose aspirin-was on low-dose aspirin prior to admission -Continue statin -Hold on full dose anticoagulation as well as beta blocker for now  Active Problems:    GERD (gastroesophageal reflux disease) -Continue preadmission PPI -May explain patient's report of better taste in mouth recently    HTN (hypertension) -Current blood pressure controlled -Continue preadmission Norvasc    Asthma -No wheezing during my exam nor during EDP exam -Cause of chest pain EDP was concerned over possible exacerbation so was given nebulizer treatment as well as one-time dose Solu-Medrol -Continue preadmission prn Aventyl MDI    HLD (hyperlipidemia) -Continue preadmission Lipitor    Anxiety and depression -Continue preadmission Xanax and Wellbutrin as well as Prozac    OA (osteoarthritis)      DVT prophylaxis: Lovenox  Code Status: Full Code Family Communication: Family at bedside with patient permission Disposition Plan: Anticipate discharge back to preadmission home environment once medically stable Consults called: Cardiology/[facility] on-call physician  Admission status: Observation/telemetry     Neylan Koroma L. ANP-BC [facility] Pager [phone]   If 7PM-7AM, please contact night-coverage [email or web address] Password [facility]  12/18/2015, 2:41 PM

## 2015-12-18 NOTE — Consult Note (Signed)
Patient ID: Brenda Bautista MRN: [id number], DOB/AGE: Jul 18, 1945   Admit date: 12/18/2015   Reason for Consult: Chest Pain + exertional dyspnea Requesting MD: Dr. Marthenia Rolling, Internal Medicine    Primary Physician: Lujean Amel, MD Primary Cardiologist: New (was scheduled for new pt appt with Dr. Ellyn Hack 12/19/15)  Pt. Profile:  70 y/o female with h/o HTN, HLD, strong family h/o premature CAD, 2nd hand smoke exposure and medication noncompliance, presenting to [facility] with complaint of a 6 month h/o progressive exertional dyspnea and a 3-4 week history of intermittent chest pain.   Problem List  Past Medical History  Diagnosis Date  . Hypertension   . Arthritis   . Asthma   . Hyperlipemia   . Depression   . Anxiety   . Cataract   . Allergic rhinitis   . Vitamin D deficiency   . Sinusitis     History reviewed. No pertinent past surgical history.   Allergies  Allergies  Allergen Reactions  . Codeine   . Bactrim [Sulfamethoxazole-Trimethoprim] Rash  . Flagyl [Metronidazole] Rash  . Meloxicam Other (See Comments)    Abdominal pain     HPI  70 y/o female with h/o HTN, HLD, strong family h/o premature CAD, 2nd hand smoke exposure and medication noncompliance, presenting to [facility] with complaint of a 6 month h/o progressive exertional dyspnea and a 3-4 week history of intermittent chest pain.   She has no documented cardiac history. She was adopted but knows that her biological mother had CAD and suffered a MI in her early 27s and later required a PPM. She is unaware of her biological father's medical history. No known biological siblings. She admits to medication noncompliance. She does not always take her meds for HTN or HLD. She has no known history of DM that she is aware of. She also notes a h/o asthma.   She notes symptoms first started 6 months ago, mainly exertional dyspnea that has progressively worsened. Now she gets short of breath walking short distances around  her home. Also with occasional resting dyspnea, not improved with use of her asthma inhalers. Over the last 3-4 weeks, she has develoed intermittent CP.  Described as substernal chest pressure radiating to her mid scapular area, neck and left arm. Occurs at rest and worsened by exertion. Episodes have also been accompanied with diaphoresis and near syncope. No frank syncope. Her PCP had referred her to see Dr. Ellyn Hack as a new patient. Her appt was scheduled to be tomorrow, however she had a severe episode of recurrent dyspnea and CP, prompting her to go to the ED. She was given 2 SL NTGs but she became hypotensive and required IVF bolus. BP stabilized and her pain improved. She is currently CP free.   EKG shows NSR with nonspecific ST abnormalities. CBC and BMP unremarkable. Initial troponin is negative. CXR unremarkable.   She has been admitted by IM. There is concern for possible GI vs biliary etiology as well. Abdominal US has been ordered + trial of GI cocktail. Cardiology has been consulted for additional recommendations.     Home Medications  Prior to Admission medications   Medication Sig Start Date End Date Taking? Authorizing Provider  albuterol (PROVENTIL HFA;VENTOLIN HFA) 108 (90 Base) MCG/ACT inhaler Inhale 2 puffs into the lungs every 6 (six) hours as needed for wheezing or shortness of breath.   Yes Historical Provider, MD  ALPRAZolam (XANAX) 0.25 MG tablet Take 0.25 mg by mouth 2 (two) times daily as needed  for anxiety.   Yes Historical Provider, MD  amLODipine (NORVASC) 2.5 MG tablet Take 2.5 mg by mouth daily.   Yes Historical Provider, MD  aspirin 81 MG tablet Take 81 mg by mouth daily.   Yes Historical Provider, MD  atorvastatin (LIPITOR) 20 MG tablet Take 20 mg by mouth daily.   Yes Historical Provider, MD  buPROPion (WELLBUTRIN XL) 300 MG 24 hr tablet Take 300 mg by mouth daily.   Yes Historical Provider, MD  carboxymethylcellulose (REFRESH PLUS) 0.5 % SOLN Place 1 drop into  both eyes at bedtime.   Yes Historical Provider, MD  diphenhydrAMINE (BENADRYL) 25 MG tablet Take 25 mg by mouth every 6 (six) hours as needed.   Yes Historical Provider, MD  FLUoxetine (PROZAC) 40 MG capsule Take 40 mg by mouth daily.   Yes Historical Provider, MD  meclizine (ANTIVERT) 25 MG tablet Take 25 mg by mouth 3 (three) times daily as needed for dizziness.   Yes Historical Provider, MD  Multiple Vitamins-Minerals (MULTIVITAMIN WITH MINERALS) tablet Take 1 tablet by mouth daily.   Yes Historical Provider, MD  omeprazole (PRILOSEC) 20 MG capsule Take 20 mg by mouth daily.   Yes Historical Provider, MD    Family History  Family History  Problem Relation Age of Onset  . Coronary artery disease Mother 93    MI in early 28s  . Sick sinus syndrome Mother     required PPM    Social History  Social History   Social History  . Marital Status: Married    Spouse Name: N/A  . Number of Children: N/A  . Years of Education: N/A   Occupational History  . Not on file.   Social History Main Topics  . Smoking status: Never Smoker   . Smokeless tobacco: Never Used  . Alcohol Use: No  . Drug Use: No  . Sexual Activity: Not on file   Other Topics Concern  . Not on file   Social History Narrative  . No narrative on file     Review of Systems General:  No chills, fever, night sweats or weight changes.  Cardiovascular:  + chest pain, +dyspnea on exertion, no edema, orthopnea, palpitations, paroxysmal nocturnal dyspnea. Dermatological: No rash, lesions/masses Respiratory: No cough, dyspnea Urologic: No hematuria, dysuria Abdominal:   No nausea, vomiting, diarrhea, bright red blood per rectum, melena, or hematemesis Neurologic:  No visual changes, wkns, changes in mental status. All other systems reviewed and are otherwise negative except as noted above.  Physical Exam  Blood pressure 152/56, pulse 76, temperature 98.1 F (36.7 C), temperature source Oral, resp. rate 18,  height 5' (1.524 m), weight 137 lb 3.2 oz (62.234 kg), SpO2 95 %.  General: Pleasant, NAD Psych: Normal affect. Neuro: Alert and oriented X 3. Moves all extremities spontaneously. HEENT: Normal  Neck: Supple without bruits or JVD. Lungs:  Resp regular and unlabored, CTA. Heart: RRR no s3, s4, ? Slight 1/6 SM along RUSB. Abdomen: Soft, non-tender, non-distended, BS + x 4.  Extremities: No clubbing, cyanosis or edema. DP/PT/Radials 2+ and equal bilaterally.  Labs  Troponin [facility] of Care Test)  Recent Labs  12/18/15 1040  TROPIPOC 0.00    Recent Labs  12/18/15 1315  TROPONINI <0.03   Lab Results  Component Value Date   WBC 10.1 12/18/2015   HGB 14.0 12/18/2015   HCT 41.9 12/18/2015   MCV 90.3 12/18/2015   PLT 307 12/18/2015     Recent Labs Lab 12/18/15 1011  NA 137  K 4.3  CL 104  CO2 23  BUN 12  CREATININE 0.96  CALCIUM 9.4  GLUCOSE 97   No results found for: CHOL, HDL, LDLCALC, TRIG No results found for: DDIMER   Radiology/Studies  Dg Chest 2 View  12/18/2015  CLINICAL DATA:  70 year old female with left-sided chest pain for 2 weeks. Associated shortness of breath. EXAM: CHEST  2 VIEW COMPARISON:  11/27/2014 and prior chest radiographs FINDINGS: Cardiomegaly again noted. Mild interstitial prominence is unchanged. There is no evidence of focal airspace disease, pulmonary edema, suspicious pulmonary nodule/mass, pleural effusion, or pneumothorax. No acute bony abnormalities are identified. IMPRESSION: Cardiomegaly without evidence of acute cardiopulmonary disease. Electronically Signed   By: Margarette Canada M.D.   On: 12/18/2015 11:16   US Abdomen Limited Ruq  12/18/2015  CLINICAL DATA:  Chest pain for 1 week EXAM: US ABDOMEN LIMITED - RIGHT UPPER QUADRANT COMPARISON:  None. FINDINGS: Gallbladder: No gallstones or wall thickening visualized. No sonographic Murphy sign noted by sonographer. Common bile duct: Diameter: 4.2 mm Liver: No focal lesion identified. Within  normal limits in parenchymal echogenicity. IMPRESSION: Normal right upper quadrant ultrasound. Electronically Signed   By: Lajean Manes M.D.   On: 12/18/2015 14:52    ECG  EKG shows NSR with nonspecific ST abnormalities.   Echocardiogram - pending  ASSESSMENT AND PLAN  Principal Problem:   Unstable angina (HCC) Active Problems:   Chest pain   HTN (hypertension)   Asthma   GERD (gastroesophageal reflux disease)   HLD (hyperlipidemia)   OA (osteoarthritis)    Patient's symptomatolgy is very concerning for cardiac etiology/ unstable angina, with progressive exertional dyspnea and resting CP, also exacerbated by exertion. She has multiple cardias risk factors including strong family h/o premature CAD in 1st degree relatives (biological mother with CAD/ MI in her early 76s), HTN, HLD, medical noncompliance with home meds and 2nd hand smoke exposure over many years. RUQ [place] is negative for gallbladder pathology. CXR is unremarkable. EKG non acute. Initial troponin is negative. Symptomatology is very concerning for cardiac etiology. For this reason, would recommend definitive LHC in the am. Given her hypotensive response to SL NTG, would use caution with further use of NTG, if possible. Recommend ASA, high intensity statin and BB, if BP allows. Fasting lipid panel in the am. Recommend screening for DM with a Hgb A1c. Continue to cycle troponin's x 3. Start IV heparin if she rules in for NSTEMI. 2D echo to assess LVF, wall motion and valve anatomy. We also discussed the importance of full medication compliance with her antihypertensives and lipid lowering meds for risk reduction. Keep NPO after midnight. MD to assess and will provide further recommendations.   Signed, Lyda Jester, PA-C 12/18/2015, 5:22 PM

## 2015-12-18 NOTE — ED Notes (Signed)
MD at bedside.Hospitalist PA at bedside and aware of c/o intermittent chest pain lasting 1 minute or less.

## 2015-12-18 NOTE — Progress Notes (Signed)
RUQ abd [place] negative  Erin Hearing, ANP

## 2015-12-18 NOTE — ED Notes (Signed)
Pt reports CP intermittent since last Monday with generalized fatigue.  Pt reports pain radiating to L shoulder, neck, back, describes pain as heavy pressure.  Pt denies LOC, reports nausea, denies vomiting. Pt AOx4, rates pain 4/10 at this time.

## 2015-12-18 NOTE — ED Provider Notes (Signed)
CSN: [id number]     Arrival date & time 12/18/15  1002 History   First MD Initiated Contact with Patient 12/18/15 1018     Chief Complaint  Patient presents with  . Chest Pain     (Consider location/radiation/quality/duration/timing/severity/associated sxs/prior Treatment) HPI Patient presents with concern of chest pain, dyspnea. Patient has history of asthma, uses albuterol daily. She notes that over the past week she has had spontaneous episodes of pain throughout her upper thorax, both shoulders, chest, neck. Each episode lasts several moments, resolved without clear intervention. She also has episodes of ongoing dyspnea, as well as generalized fatigue that is persistent throughout this illness. No ongoing fever, chills, confusion, disorientation, fall. Patient states that she generally feels poorly, does not specify focal pain currently. No recent medication changes, diet changes, beyond recent course of steroids for facial rash. She completed that course of steroids the day prior to symptom onset.  Past Medical History  Diagnosis Date  . Hypertension   . Arthritis   . Asthma   . Hyperlipemia   . Depression   . Anxiety   . Cataract   . Allergic rhinitis   . Vitamin D deficiency   . Sinusitis    History reviewed. No pertinent past surgical history. No family history on file. Social History  Substance Use Topics  . Smoking status: Never Smoker   . Smokeless tobacco: Never Used  . Alcohol Use: No   OB History    No data available     Review of Systems  Constitutional:       Per HPI, otherwise negative  HENT:       Per HPI, otherwise negative  Respiratory:       Per HPI, otherwise negative  Cardiovascular:       Per HPI, otherwise negative  Gastrointestinal: Negative for vomiting.  Endocrine:       Negative aside from HPI  Genitourinary:       Neg aside from HPI   Musculoskeletal:       Per HPI, otherwise negative  Skin: Negative.   Neurological:  Negative for syncope.      Allergies  Codeine; Bactrim; Flagyl; and Meloxicam  Home Medications   Prior to Admission medications   Not on File   BP 148/85 mmHg  Pulse 67  Temp(Src) 98.8 F (37.1 C) (Oral)  Resp 18  SpO2 95% Physical Exam  Constitutional: She is oriented to person, place, and time. She has a sickly appearance. No distress.  HENT:  Head: Normocephalic and atraumatic.  Eyes: Conjunctivae and EOM are normal.  Cardiovascular: Normal rate and regular rhythm.   Pulmonary/Chest: Effort normal and breath sounds normal. No stridor. No respiratory distress.  Abdominal: She exhibits no distension.  Musculoskeletal: She exhibits no edema.  Neurological: She is alert and oriented to person, place, and time. No cranial nerve deficit.  Skin: Skin is warm and dry.  Psychiatric: She has a normal mood and affect.  Nursing note and vitals reviewed.   ED Course  Procedures (including critical care time) Labs Review Labs Reviewed  BASIC METABOLIC PANEL - Abnormal; Notable for the following:    GFR calc non Af Amer 58 (*)    All other components within normal limits  CBC  BRAIN NATRIURETIC PEPTIDE  I-STAT TROPOININ, ED    Imaging Review Dg Chest 2 View  12/18/2015  CLINICAL DATA:  70 year old female with left-sided chest pain for 2 weeks. Associated shortness of breath. EXAM: CHEST  2 VIEW  COMPARISON:  11/27/2014 and prior chest radiographs FINDINGS: Cardiomegaly again noted. Mild interstitial prominence is unchanged. There is no evidence of focal airspace disease, pulmonary edema, suspicious pulmonary nodule/mass, pleural effusion, or pneumothorax. No acute bony abnormalities are identified. IMPRESSION: Cardiomegaly without evidence of acute cardiopulmonary disease. Electronically Signed   By: Margarette Canada M.D.   On: 12/18/2015 11:16   I have personally reviewed and evaluated these images and lab results as part of my medical decision-making.   EKG  Interpretation   Date/Time:  Thursday December 18 2015 10:10:47 EDT Ventricular Rate:  69 PR Interval:  142 QRS Duration: 72 QT Interval:  394 QTC Calculation: 422 R Axis:   32 Text Interpretation:  Normal sinus rhythm Normal ECG Confirmed by  Carmin Muskrat  MD ([id number]) on 12/18/2015 10:18:58 AM Also confirmed by  Carmin Muskrat  MD [phone]), editor [place]  [facility], [place] ([id number])  on  12/18/2015 11:00:05 AM     Pulse ox 99% room air normal Cardiac 65 sinus normal  After provision of nitroglycerin for chest pain patient had decrease in blood pressure, [id number] systolic. This improved with fluids.  Update: Patient is receiving albuterol therapy, states that she feels better.   MDM  Elderly female with pulmonary disease presents with chest pain. Here she is awake and alert, but has mild persistent chest pain. Patient does have some improvement with albuterol, nitroglycerin, but had one episode of hypotension in the emergency department. Positive risk factors, and ongoing dyspnea, chest pain, patient was admitted for further evaluation and management, though initial studies were reassuring.   Carmin Muskrat, MD 12/18/15 1310

## 2015-12-18 NOTE — ED Notes (Signed)
Atrtempted to call report

## 2015-12-18 NOTE — ED Notes (Signed)
Patient transported to Ultrasound 

## 2015-12-18 NOTE — ED Notes (Signed)
Pt begin sweating and states she feels nauseated.BP noted as 103/49.  HOB lowered and Dr. Vanita Panda. Notified.

## 2015-12-18 NOTE — ED Notes (Signed)
Ptstaes no change in chest pain. Remains 2/10.

## 2015-12-19 ENCOUNTER — Encounter (HOSPITAL_COMMUNITY)
Admission: EM | Disposition: A | Discharge status: Home / Self Care | Payer: Self-pay | Source: Home / Self Care | Attending: Emergency Medicine

## 2015-12-19 ENCOUNTER — Observation Stay (HOSPITAL_BASED_OUTPATIENT_CLINIC_OR_DEPARTMENT_OTHER): Payer: Commercial Managed Care - HMO

## 2015-12-19 ENCOUNTER — Encounter (HOSPITAL_COMMUNITY): Payer: Self-pay | Admitting: Cardiology

## 2015-12-19 DIAGNOSIS — I10 Essential (primary) hypertension: Secondary | ICD-10-CM | POA: Diagnosis not present

## 2015-12-19 DIAGNOSIS — R0789 Other chest pain: Secondary | ICD-10-CM | POA: Diagnosis not present

## 2015-12-19 DIAGNOSIS — I200 Unstable angina: Secondary | ICD-10-CM | POA: Diagnosis not present

## 2015-12-19 DIAGNOSIS — R079 Chest pain, unspecified: Secondary | ICD-10-CM | POA: Diagnosis not present

## 2015-12-19 DIAGNOSIS — E785 Hyperlipidemia, unspecified: Secondary | ICD-10-CM | POA: Diagnosis not present

## 2015-12-19 DIAGNOSIS — I25110 Atherosclerotic heart disease of native coronary artery with unstable angina pectoris: Secondary | ICD-10-CM | POA: Diagnosis not present

## 2015-12-19 HISTORY — PX: CARDIAC CATHETERIZATION: SHX172

## 2015-12-19 LAB — TROPONIN I: Troponin I: 0.03 ng/mL (ref <0.03)

## 2015-12-19 LAB — ECHOCARDIOGRAM COMPLETE
HEIGHTINCHES: 60 in
WEIGHTICAEL: 2217.6 [oz_av]

## 2015-12-19 SURGERY — LEFT HEART CATH AND CORONARY ANGIOGRAPHY
Anesthesia: LOCAL

## 2015-12-19 MED ORDER — SODIUM CHLORIDE 0.9 % WEIGHT BASED INFUSION: 3.0000 mL/kg/h | INTRAVENOUS | Status: DC

## 2015-12-19 MED ORDER — SODIUM CHLORIDE 0.9% FLUSH: 3.0000 mL | INTRAVENOUS | Status: DC | PRN

## 2015-12-19 MED ORDER — MIDAZOLAM HCL 2 MG/2ML IJ SOLN
INTRAMUSCULAR | Status: AC
  Filled 2015-12-19: qty 2

## 2015-12-19 MED ORDER — VERAPAMIL HCL 2.5 MG/ML IV SOLN
INTRAVENOUS | Status: DC | PRN
  Administered 2015-12-19: 10 mL via INTRA_ARTERIAL

## 2015-12-19 MED ORDER — HEPARIN SODIUM (PORCINE) 1000 UNIT/ML IJ SOLN
INTRAMUSCULAR | Status: DC | PRN
  Administered 2015-12-19: 4000 [IU] via INTRAVENOUS

## 2015-12-19 MED ORDER — HEPARIN (PORCINE) IN NACL 2-0.9 UNIT/ML-% IJ SOLN
INTRAMUSCULAR | Status: AC
  Filled 2015-12-19: qty 1500

## 2015-12-19 MED ORDER — SODIUM CHLORIDE 0.9 % IV SOLN: 250.0000 mL | INTRAVENOUS | Status: DC | PRN

## 2015-12-19 MED ORDER — HEPARIN (PORCINE) IN NACL 2-0.9 UNIT/ML-% IJ SOLN
INTRAMUSCULAR | Status: DC | PRN
  Administered 2015-12-19: 1500 mL

## 2015-12-19 MED ORDER — ASPIRIN 81 MG PO CHEW
81.0000 mg | CHEWABLE_TABLET | Freq: Every day | ORAL | Status: DC
  Administered 2015-12-20: 81 mg via ORAL
  Filled 2015-12-19: qty 1

## 2015-12-19 MED ORDER — LIDOCAINE HCL (PF) 1 % IJ SOLN
INTRAMUSCULAR | Status: AC
  Filled 2015-12-19: qty 30

## 2015-12-19 MED ORDER — VERAPAMIL HCL 2.5 MG/ML IV SOLN
INTRAVENOUS | Status: AC
  Filled 2015-12-19: qty 2

## 2015-12-19 MED ORDER — LIDOCAINE HCL (PF) 1 % IJ SOLN
INTRAMUSCULAR | Status: DC | PRN
  Administered 2015-12-19: 2 mL

## 2015-12-19 MED ORDER — MIDAZOLAM HCL 2 MG/2ML IJ SOLN
INTRAMUSCULAR | Status: DC | PRN
  Administered 2015-12-19: 1 mg via INTRAVENOUS

## 2015-12-19 MED ORDER — ATORVASTATIN CALCIUM 20 MG PO TABS: 20.0000 mg | ORAL_TABLET | Freq: Every day | ORAL | Status: DC

## 2015-12-19 MED ORDER — IOPAMIDOL (ISOVUE-370) INJECTION 76%
INTRAVENOUS | Status: AC
  Filled 2015-12-19: qty 100

## 2015-12-19 MED ORDER — FENTANYL CITRATE (PF) 100 MCG/2ML IJ SOLN
INTRAMUSCULAR | Status: AC
  Filled 2015-12-19: qty 2

## 2015-12-19 MED ORDER — HEPARIN SODIUM (PORCINE) 1000 UNIT/ML IJ SOLN
INTRAMUSCULAR | Status: AC
  Filled 2015-12-19: qty 1

## 2015-12-19 MED ORDER — SODIUM CHLORIDE 0.9% FLUSH
3.0000 mL | Freq: Two times a day (BID) | INTRAVENOUS | Status: DC
  Administered 2015-12-19 – 2015-12-20 (×2): 3 mL via INTRAVENOUS

## 2015-12-19 MED ORDER — FENTANYL CITRATE (PF) 100 MCG/2ML IJ SOLN
INTRAMUSCULAR | Status: DC | PRN
  Administered 2015-12-19: 25 ug via INTRAVENOUS

## 2015-12-19 MED ORDER — ATORVASTATIN CALCIUM 80 MG PO TABS
80.0000 mg | ORAL_TABLET | Freq: Every day | ORAL | Status: DC
  Administered 2015-12-19: 80 mg via ORAL
  Filled 2015-12-19: qty 1

## 2015-12-19 SURGICAL SUPPLY — 9 items
CATH OPTITORQUE TIG 4.0 5F (CATHETERS) ×2 IMPLANT
DEVICE RAD COMP TR BAND LRG (VASCULAR PRODUCTS) ×2 IMPLANT
GLIDESHEATH SLEND A-KIT 6F 22G (SHEATH) ×2 IMPLANT
GLIDESHEATH SLEND SS 6F .021 (SHEATH) ×2 IMPLANT
KIT HEART LEFT (KITS) ×2 IMPLANT
PACK CARDIAC CATHETERIZATION (CUSTOM PROCEDURE TRAY) ×2 IMPLANT
TRANSDUCER W/STOPCOCK (MISCELLANEOUS) ×2 IMPLANT
TUBING CIL FLEX 10 FLL-RA (TUBING) ×2 IMPLANT
WIRE SAFE-T 1.5MM-J .035X260CM (WIRE) ×2 IMPLANT

## 2015-12-19 NOTE — Interval H&P Note (Signed)
History and Physical Interval Note:  12/19/2015 1:17 PM  Brenda Bautista  has presented today for surgery, with the diagnosis of unstable angina.  The various methods of treatment have been discussed with the patient and family. After consideration of risks, benefits and other options for treatment, the patient has consented to  Procedure(s): Left Heart Cath and Coronary Angiography (N/A) with Possible Percutaneous Coronary Intervention as a surgical intervention .  The patient's history has been reviewed, patient examined, no change in status, stable for surgery.  I have reviewed the patient's chart and labs.  Questions were answered to the patient's satisfaction.    Cath Lab Visit (complete for each Cath Lab visit)  Clinical Evaluation Leading to the Procedure:   ACS: Yes.    Non-ACS:    Anginal Classification: CCS IV  Anti-ischemic medical therapy: Minimal Therapy (1 class of medications)  Non-Invasive Test Results: No non-invasive testing performed  Prior CABG: No previous CABG   Ischemic Symptoms? CCS IV (Inability to carry on any physical activity without discomfort) Anti-ischemic Medical Therapy? Minimal Therapy (1 class of medications) Non-invasive Test Results? No non-invasive testing performed Prior CABG? No Previous CABG   Patient Information:   1-2V CAD, no prox LAD  A (7)  Indication: 20; Score: 7   Patient Information:   1-2V-CAD with DS 50-60% With No FFR, No IVUS  I (3)  Indication: 21; Score: 3   Patient Information:   1-2V-CAD with DS 50-60% With FFR  A (7)  Indication: 22; Score: 7   Patient Information:   1-2V-CAD with DS 50-60% With FFR>0.8, IVUS not significant  I (2)  Indication: 23; Score: 2   Patient Information:   3V-CAD without LMCA With Abnormal LV systolic function  A (9)  Indication: 48; Score: 9   Patient Information:   LMCA-CAD  A (9)  Indication: 49; Score: 9   Patient Information:   3V-CAD without  LMCA With Low CAD burden(i.e., 3 focal stenoses, low SYNTAX score) PCI  A (7)  Indication: 63; Score: 7   Patient Information:   3V-CAD without LMCA With Low CAD burden(i.e., 3 focal stenoses, low SYNTAX score) CABG  A (9)  Indication: 63; Score: 9   Patient Information:   3V-CAD without LMCA E06c - Intermediate-high CAD burden (i.e., multiple diffuse lesions, presence of CTO, or high SYNTAX score) PCI  U (4)  Indication: 64; Score: 4   Patient Information:   3V-CAD without LMCA E06c - Intermediate-high CAD burden (i.e., multiple diffuse lesions, presence of CTO, or high SYNTAX score) CABG  A (9)  Indication: 64; Score: 9   Patient Information:   LMCA-CAD With Isolated LMCA stenosis  PCI  U (6)  Indication: 65; Score: 6   Patient Information:   LMCA-CAD With Isolated LMCA stenosis  CABG  A (9)  Indication: 65; Score: 9   Patient Information:   LMCA-CAD Additional CAD, low CAD burden (i.e., 1- to 2-vessel additional involvement, low SYNTAX score) PCI  U (5)  Indication: 66; Score: 5   Patient Information:   LMCA-CAD Additional CAD, low CAD burden (i.e., 1- to 2-vessel additional involvement, low SYNTAX score) CABG  A (9)  Indication: 66; Score: 9   Patient Information:   LMCA-CAD Additional CAD, intermediate-high CAD burden (i.e., 3-vessel involvement, presence of CTO, or high SYNTAX score) PCI  I (3)  Indication: 67; Score: 3   Patient Information:   LMCA-CAD Additional CAD, intermediate-high CAD burden (i.e., 3-vessel involvement, presence of CTO, or high  SYNTAX score) CABG  A (9)  Indication: 67; Score: 9    Glenetta Hew

## 2015-12-19 NOTE — Progress Notes (Signed)
  Echocardiogram 2D Echocardiogram has been performed.  Jennette Dubin 12/19/2015, 4:43 PM

## 2015-12-19 NOTE — Plan of Care (Signed)
Problem: Phase III Progression Outcomes Goal: Cath/PCI Path as indicated Outcome: Progressing Scheduled for heart cath today 12/19/15 Education video #115 shown to patient and family. Printed out information for heart catherization for patient and family.

## 2015-12-19 NOTE — Progress Notes (Signed)
Hospital Problem List     Principal Problem:   Unstable angina ([place]) Active Problems:   Chest pain   Essential hypertension   Asthma   GERD (gastroesophageal reflux disease)   Hyperlipidemia with target LDL less than 100   OA (osteoarthritis)    Patient Profile:   Primary Cardiologist: Dr. Ellyn Hack  70 y/o female w/ PMH of  HTN, HLD, strong family h/o premature CAD, 2nd smoke exposure and medication noncompliance who presented to [facility]  On 12/18/2015 with complaint of a 6 month h/o progressive exertional dyspnea and a 3-4 week history of intermittent chest pain.   Subjective   Reports having episodes of mild chest discomfort overnight. Still with mild dyspnea by patient's report, but breathing comfortably on exam.   Inpatient Medications    . amLODipine  2.5 mg Oral Daily  . aspirin EC  325 mg Oral Daily  . atorvastatin  20 mg Oral Daily  . buPROPion  300 mg Oral Daily  . enoxaparin (LOVENOX) injection  40 mg Subcutaneous Q24H  . FLUoxetine  40 mg Oral Daily  . hydroxypropyl methylcellulose / hypromellose  1 drop Both Eyes QHS  . multivitamin with minerals  1 tablet Oral Daily  . pantoprazole  40 mg Oral Daily  . sodium chloride flush  3 mL Intravenous Q12H    Vital Signs    Filed Vitals:   12/18/15 2041 12/18/15 2349 12/19/15 0452 12/19/15 1007  BP: 144/51 127/46 124/65 133/69  Pulse: 84 77 70 64  Temp: 98 F (36.7 C) 98 F (36.7 C) 97.8 F (36.6 C)   TempSrc: Oral Oral Oral   Resp: 18 18 18 16   Height:      Weight:   138 lb 9.6 oz (62.869 kg)   SpO2: 94% 93% 93%     Intake/Output Summary (Last 24 hours) at 12/19/15 1018 Last data filed at 12/19/15 1000  Gross per 24 hour  Intake  186.6 ml  Output   1000 ml  Net -813.4 ml   Filed Weights   12/18/15 1538 12/19/15 0452  Weight: 137 lb 3.2 oz (62.234 kg) 138 lb 9.6 oz (62.869 kg)    Physical Exam    General: Well developed, well nourished, female appearing in no acute distress. Head: Normocephalic,  atraumatic.  Neck: Supple without bruits, JVD not elevated. Lungs:  Resp regular and unlabored, CTA without wheezing or rales. Heart: RRR, S1, S2, no S3, S4, or murmur; no rub. Abdomen: Soft, non-tender, non-distended with normoactive bowel sounds. No hepatomegaly. No rebound/guarding. No obvious abdominal masses. Extremities: No clubbing, cyanosis, or edema. Distal pedal pulses are 2+ bilaterally. Neuro: Alert and oriented X 3. Moves all extremities spontaneously. Psych: Normal affect.  Labs    CBC  Recent Labs  12/18/15 1011 12/18/15 2048  WBC 10.1 16.6*  HGB 14.0 13.1  HCT 41.9 38.5  MCV 90.3 90.2  PLT 307 [id number]   Basic Metabolic Panel  Recent Labs  12/18/15 1011 12/18/15 2048  NA 137 132*  K 4.3 4.0  CL 104 99*  CO2 23 20*  GLUCOSE 97 164*  BUN 12 15  CREATININE 0.96 1.20*  CALCIUM 9.4 9.3   Liver Function Tests No results for input(s): AST, ALT, ALKPHOS, BILITOT, PROT, ALBUMIN in the last 72 hours. No results for input(s): LIPASE, AMYLASE in the last 72 hours. Cardiac Enzymes  Recent Labs  12/18/15 1315 12/18/15 1934 12/19/15 0047  TROPONINI <0.03 0.03* 0.03*     Telemetry  NSR, HR in 60's - 70's. No atopic events.    Cardiac Studies and Radiology    Dg Chest 2 View: 12/18/2015  CLINICAL DATA:  70 year old female with left-sided chest pain for 2 weeks. Associated shortness of breath. EXAM: CHEST  2 VIEW COMPARISON:  11/27/2014 and prior chest radiographs FINDINGS: Cardiomegaly again noted. Mild interstitial prominence is unchanged. There is no evidence of focal airspace disease, pulmonary edema, suspicious pulmonary nodule/mass, pleural effusion, or pneumothorax. No acute bony abnormalities are identified. IMPRESSION: Cardiomegaly without evidence of acute cardiopulmonary disease. Electronically Signed   By: Margarette Canada M.D.   On: 12/18/2015 11:16   US Abdomen Limited Ruq: 12/18/2015  CLINICAL DATA:  Chest pain for 1 week EXAM: US ABDOMEN LIMITED - RIGHT  UPPER QUADRANT COMPARISON:  None. FINDINGS: Gallbladder: No gallstones or wall thickening visualized. No sonographic Murphy sign noted by sonographer. Common bile duct: Diameter: 4.2 mm Liver: No focal lesion identified. Within normal limits in parenchymal echogenicity. IMPRESSION: Normal right upper quadrant ultrasound. Electronically Signed   By: Lajean Manes M.D.   On: 12/18/2015 14:52    Echocardiogram: Pending  Assessment & Plan    1. Chest Pain concerning for Unstable Angina - presented to [facility] ED on 12/18/2015 with a 6 month h/o progressive exertional dyspnea and a 3-4 week history of intermittent chest pain, concerning for unstable angina. - She has multiple cardias risk factors including strong family h/o premature CAD in 1st degree relatives (biological mother with CAD/ MI in her early 105s), HTN, HLD, medical noncompliance with home meds and 2nd hand smoke exposure over many years.  - cyclic troponin values have been <0.03, 0.03, and 0.03. EKG without acute ischemic changes. - for definitive cardiac catheterization this morning with Dr. Ellyn Hack. The patient understands that risks included but are not limited to stroke (1 in 1000), death (1 in 62), kidney failure [usually temporary] (1 in 500), bleeding (1 in 200), allergic reaction [possibly serious] (1 in 200).  - continue ASA (switch 325mg  to 81mg ), statin, and Amlodipine. Consider addition of BB if significant CAD noted.   2. HTN - BP improved since admission, currently at 133/69.  - continue Amlodipine. Potential BB as noted above.   3. HLD - will check Lipid Panel. - continue Lipitor 20 mg daily. Will need to increase to high-intensity if significant CAD noted on cath.   4. Stage 3 CKD - creatinine 1.20 on 7/13. Received pre-cath hydration.   Signed, Erma Heritage , PA-C 10:18 AM 12/19/2015 Pager: [phone]

## 2015-12-19 NOTE — Progress Notes (Signed)
PROGRESS NOTE  TWANDA ESCHBACH [id number] DOB: Aug 23, 1945 DOA: 12/18/2015 PCP: Lujean Amel, MD  HPI/Recap of past 24 hours: Brenda Bautista is a 70 y.o. female with medical history significant for hypertension, asthma, osteoarthritis, GERD and dyslipidemia who presents to the hospital complaining of left-sided chest pain ongoing for about 3-4 weeks.  She was seen by cardiology in consultation and they plan diagnostic heart cath today. She currently is still having some intermittent chest discomfort.  Assessment/Plan: Principal Problem:   Unstable angina ([facility]) Active Problems:   Chest pain   Essential hypertension   Asthma   GERD (gastroesophageal reflux disease)   Hyperlipidemia with target LDL less than 100   OA (osteoarthritis)   Chest pain -Older female patient presenting with both typical and atypical chest pain symptoms with multiple risk factors including: Advanced age, female, dyslipidemia, hypertension, positive family history -Cardiology consulted -Cycle troponin noting initial troponin and EKG reassuring -Echocardiogram cancelled -Full dose aspirin-was on low-dose aspirin prior to admission -Continue statin -Hold on full dose anticoagulation as well as beta blocker for now - cath this PM  Active Problems:  GERD (gastroesophageal reflux disease) -Continue preadmission PPI -May explain patient's report of better taste in mouth recently   HTN (hypertension) -Current blood pressure controlled -Continue preadmission Norvasc   Asthma -No wheezing during my exam nor during EDP exam -Cause of chest pain EDP was concerned over possible exacerbation so was given nebulizer treatment as well as one-time dose Solu-Medrol -Continue preadmission prn Aventyl MDI   HLD (hyperlipidemia) -Continue preadmission Lipitor   Anxiety and depression -Continue preadmission Xanax and Wellbutrin as well as Prozac   OA (osteoarthritis)   Code Status: FULL    Family Communication: Plenty of family in room this AM, all questions answered.   Disposition Plan: Likely to home in AM.    Consultants:  Cardiology   Procedures:  Plan for cath today 7/14   Antimicrobials:  None    Objective: Filed Vitals:   12/18/15 2349 12/19/15 0452 12/19/15 1007 12/19/15 1126  BP: 127/46 124/65 133/69 142/60  Pulse: 77 70 64 15  Temp: 98 F (36.7 C) 97.8 F (36.6 C)  98.1 F (36.7 C)  TempSrc: Oral Oral  Oral  Resp: 18 18 16 18   Height:      Weight:  62.869 kg (138 lb 9.6 oz)    SpO2: 93% 93%  95%    Intake/Output Summary (Last 24 hours) at 12/19/15 1212 Last data filed at 12/19/15 1135  Gross per 24 hour  Intake  248.8 ml  Output   1600 ml  Net -1351.2 ml   Filed Weights   12/18/15 1538 12/19/15 0452  Weight: 62.234 kg (137 lb 3.2 oz) 62.869 kg (138 lb 9.6 oz)    Exam: General:  Alert, oriented, calm, in no acute distress Eyes: pupils round and reactive to light and accomodation, clear sclerea Neck: supple, no masses, trachea mildline  Cardiovascular: RRR, no murmurs or rubs, no peripheral edema  Respiratory: clear to auscultation bilaterally, no wheezes, no crackles  Abdomen: soft, nontender, nondistended, normal bowel tones heard  Skin: dry, no rashes  Musculoskeletal: no joint effusions, normal range of motion  Psychiatric: appropriate affect, normal speech  Neurologic: extraocular muscles intact, clear speech, moving all extremities with intact sensorium    Data Reviewed: CBC:  Recent Labs Lab 12/18/15 1011 12/18/15 2048  WBC 10.1 16.6*  HGB 14.0 13.1  HCT 41.9 38.5  MCV 90.3 90.2  PLT 307 290   Basic  Metabolic Panel:  Recent Labs Lab 01-17-2016 1011 01/17/16 2048  NA 137 132*  K 4.3 4.0  CL 104 99*  CO2 23 20*  GLUCOSE 97 164*  BUN 12 15  CREATININE 0.96 1.20*  CALCIUM 9.4 9.3   GFR: Estimated Creatinine Clearance: 35.1 mL/min (by C-G formula based on Cr of 1.2). Liver Function Tests: No results  for input(s): AST, ALT, ALKPHOS, BILITOT, PROT, ALBUMIN in the last 168 hours. No results for input(s): LIPASE, AMYLASE in the last 168 hours. No results for input(s): AMMONIA in the last 168 hours. Coagulation Profile:  Recent Labs Lab 01/17/16 2048  INR 1.15   Cardiac Enzymes:  Recent Labs Lab January 17, 2016 1315 01-17-16 1934 12/19/15 0047  TROPONINI <0.03 0.03* 0.03*   BNP (last 3 results) No results for input(s): PROBNP in the last 8760 hours. HbA1C: No results for input(s): HGBA1C in the last 72 hours. CBG: No results for input(s): GLUCAP in the last 168 hours. Lipid Profile: No results for input(s): CHOL, HDL, LDLCALC, TRIG, CHOLHDL, LDLDIRECT in the last 72 hours. Thyroid Function Tests: No results for input(s): TSH, T4TOTAL, FREET4, T3FREE, THYROIDAB in the last 72 hours. Anemia Panel: No results for input(s): VITAMINB12, FOLATE, FERRITIN, TIBC, IRON, RETICCTPCT in the last 72 hours. Urine analysis: No results found for: COLORURINE, APPEARANCEUR, LABSPEC, PHURINE, GLUCOSEU, HGBUR, BILIRUBINUR, KETONESUR, PROTEINUR, UROBILINOGEN, NITRITE, LEUKOCYTESUR Sepsis Labs: @LABRCNTIP (procalcitonin:4,lacticidven:4)  )No results found for this or any previous visit (from the past 240 hour(s)).    Studies: US Abdomen Limited Ruq  2016/01/17  CLINICAL DATA:  Chest pain for 1 week EXAM: US ABDOMEN LIMITED - RIGHT UPPER QUADRANT COMPARISON:  None. FINDINGS: Gallbladder: No gallstones or wall thickening visualized. No sonographic Murphy sign noted by sonographer. Common bile duct: Diameter: 4.2 mm Liver: No focal lesion identified. Within normal limits in parenchymal echogenicity. IMPRESSION: Normal right upper quadrant ultrasound. Electronically Signed   By: Lajean Manes M.D.   On: 01/17/16 14:52    Scheduled Meds: . amLODipine  2.5 mg Oral Daily  . [START ON 12/20/2015] aspirin  81 mg Oral Daily  . atorvastatin  20 mg Oral q1800  . buPROPion  300 mg Oral Daily  . enoxaparin  (LOVENOX) injection  40 mg Subcutaneous Q24H  . FLUoxetine  40 mg Oral Daily  . hydroxypropyl methylcellulose / hypromellose  1 drop Both Eyes QHS  . multivitamin with minerals  1 tablet Oral Daily  . pantoprazole  40 mg Oral Daily  . sodium chloride flush  3 mL Intravenous Q12H    Continuous Infusions: . sodium chloride 1 mL/kg/hr (12/19/15 1140)       Time spent: 31 minutes  Kimari Lienhard Marry Guan, MD [facility] Pager [phone]  If 7PM-7AM, please contact night-coverage [email or web address] Password [email or web address] 12/19/2015, 12:12 PM

## 2015-12-19 NOTE — H&P (View-Only) (Signed)
Patient ID: Brenda Bautista MRN: [id number], DOB/AGE: 1945-09-09   Admit date: 12/18/2015   Reason for Consult: Chest Pain + exertional dyspnea Requesting MD: Dr. Marthenia Rolling, Internal Medicine    Primary Physician: Lujean Amel, MD Primary Cardiologist: New (was scheduled for new pt appt with Dr. Ellyn Hack 12/19/15)  Pt. Profile:  70 y/o female with h/o HTN, HLD, strong family h/o premature CAD, 2nd hand smoke exposure and medication noncompliance, presenting to [facility] with complaint of a 6 month h/o progressive exertional dyspnea and a 3-4 week history of intermittent chest pain.   Problem List  Past Medical History  Diagnosis Date  . Hypertension   . Arthritis   . Asthma   . Hyperlipemia   . Depression   . Anxiety   . Cataract   . Allergic rhinitis   . Vitamin D deficiency   . Sinusitis     History reviewed. No pertinent past surgical history.   Allergies  Allergies  Allergen Reactions  . Codeine   . Bactrim [Sulfamethoxazole-Trimethoprim] Rash  . Flagyl [Metronidazole] Rash  . Meloxicam Other (See Comments)    Abdominal pain     HPI  70 y/o female with h/o HTN, HLD, strong family h/o premature CAD, 2nd hand smoke exposure and medication noncompliance, presenting to [facility] with complaint of a 6 month h/o progressive exertional dyspnea and a 3-4 week history of intermittent chest pain.   She has no documented cardiac history. She was adopted but knows that her biological mother had CAD and suffered a MI in her early 71s and later required a PPM. She is unaware of her biological father's medical history. No known biological siblings. She admits to medication noncompliance. She does not always take her meds for HTN or HLD. She has no known history of DM that she is aware of. She also notes a h/o asthma.   She notes symptoms first started 6 months ago, mainly exertional dyspnea that has progressively worsened. Now she gets short of breath walking short distances around  her home. Also with occasional resting dyspnea, not improved with use of her asthma inhalers. Over the last 3-4 weeks, she has develoed intermittent CP.  Described as substernal chest pressure radiating to her mid scapular area, neck and left arm. Occurs at rest and worsened by exertion. Episodes have also been accompanied with diaphoresis and near syncope. No frank syncope. Her PCP had referred her to see Dr. Ellyn Hack as a new patient. Her appt was scheduled to be tomorrow, however she had a severe episode of recurrent dyspnea and CP, prompting her to go to the ED. She was given 2 SL NTGs but she became hypotensive and required IVF bolus. BP stabilized and her pain improved. She is currently CP free.   EKG shows NSR with nonspecific ST abnormalities. CBC and BMP unremarkable. Initial troponin is negative. CXR unremarkable.   She has been admitted by IM. There is concern for possible GI vs biliary etiology as well. Abdominal US has been ordered + trial of GI cocktail. Cardiology has been consulted for additional recommendations.     Home Medications  Prior to Admission medications   Medication Sig Start Date End Date Taking? Authorizing Provider  albuterol (PROVENTIL HFA;VENTOLIN HFA) 108 (90 Base) MCG/ACT inhaler Inhale 2 puffs into the lungs every 6 (six) hours as needed for wheezing or shortness of breath.   Yes Historical Provider, MD  ALPRAZolam (XANAX) 0.25 MG tablet Take 0.25 mg by mouth 2 (two) times daily as needed  for anxiety.   Yes Historical Provider, MD  amLODipine (NORVASC) 2.5 MG tablet Take 2.5 mg by mouth daily.   Yes Historical Provider, MD  aspirin 81 MG tablet Take 81 mg by mouth daily.   Yes Historical Provider, MD  atorvastatin (LIPITOR) 20 MG tablet Take 20 mg by mouth daily.   Yes Historical Provider, MD  buPROPion (WELLBUTRIN XL) 300 MG 24 hr tablet Take 300 mg by mouth daily.   Yes Historical Provider, MD  carboxymethylcellulose (REFRESH PLUS) 0.5 % SOLN Place 1 drop into  both eyes at bedtime.   Yes Historical Provider, MD  diphenhydrAMINE (BENADRYL) 25 MG tablet Take 25 mg by mouth every 6 (six) hours as needed.   Yes Historical Provider, MD  FLUoxetine (PROZAC) 40 MG capsule Take 40 mg by mouth daily.   Yes Historical Provider, MD  meclizine (ANTIVERT) 25 MG tablet Take 25 mg by mouth 3 (three) times daily as needed for dizziness.   Yes Historical Provider, MD  Multiple Vitamins-Minerals (MULTIVITAMIN WITH MINERALS) tablet Take 1 tablet by mouth daily.   Yes Historical Provider, MD  omeprazole (PRILOSEC) 20 MG capsule Take 20 mg by mouth daily.   Yes Historical Provider, MD    Family History  Family History  Problem Relation Age of Onset  . Coronary artery disease Mother 50    MI in early 65s  . Sick sinus syndrome Mother     required PPM    Social History  Social History   Social History  . Marital Status: Married    Spouse Name: N/A  . Number of Children: N/A  . Years of Education: N/A   Occupational History  . Not on file.   Social History Main Topics  . Smoking status: Never Smoker   . Smokeless tobacco: Never Used  . Alcohol Use: No  . Drug Use: No  . Sexual Activity: Not on file   Other Topics Concern  . Not on file   Social History Narrative  . No narrative on file     Review of Systems General:  No chills, fever, night sweats or weight changes.  Cardiovascular:  + chest pain, +dyspnea on exertion, no edema, orthopnea, palpitations, paroxysmal nocturnal dyspnea. Dermatological: No rash, lesions/masses Respiratory: No cough, dyspnea Urologic: No hematuria, dysuria Abdominal:   No nausea, vomiting, diarrhea, bright red blood per rectum, melena, or hematemesis Neurologic:  No visual changes, wkns, changes in mental status. All other systems reviewed and are otherwise negative except as noted above.  Physical Exam  Blood pressure 152/56, pulse 76, temperature 98.1 F (36.7 C), temperature source Oral, resp. rate 18,  height 5' (1.524 m), weight 137 lb 3.2 oz (62.234 kg), SpO2 95 %.  General: Pleasant, NAD Psych: Normal affect. Neuro: Alert and oriented X 3. Moves all extremities spontaneously. HEENT: Normal  Neck: Supple without bruits or JVD. Lungs:  Resp regular and unlabored, CTA. Heart: RRR no s3, s4, ? Slight 1/6 SM along RUSB. Abdomen: Soft, non-tender, non-distended, BS + x 4.  Extremities: No clubbing, cyanosis or edema. DP/PT/Radials 2+ and equal bilaterally.  Labs  Troponin [facility] Test)  Recent Labs  12/18/15 1040  TROPIPOC 0.00    Recent Labs  12/18/15 1315  TROPONINI <0.03   Lab Results  Component Value Date   WBC 10.1 12/18/2015   HGB 14.0 12/18/2015   HCT 41.9 12/18/2015   MCV 90.3 12/18/2015   PLT 307 12/18/2015     Recent Labs Lab 12/18/15 1011  NA 137  K 4.3  CL 104  CO2 23  BUN 12  CREATININE 0.96  CALCIUM 9.4  GLUCOSE 97   No results found for: CHOL, HDL, LDLCALC, TRIG No results found for: DDIMER   Radiology/Studies  Dg Chest 2 View  12/18/2015  CLINICAL DATA:  70 year old female with left-sided chest pain for 2 weeks. Associated shortness of breath. EXAM: CHEST  2 VIEW COMPARISON:  11/27/2014 and prior chest radiographs FINDINGS: Cardiomegaly again noted. Mild interstitial prominence is unchanged. There is no evidence of focal airspace disease, pulmonary edema, suspicious pulmonary nodule/mass, pleural effusion, or pneumothorax. No acute bony abnormalities are identified. IMPRESSION: Cardiomegaly without evidence of acute cardiopulmonary disease. Electronically Signed   By: Margarette Canada M.D.   On: 12/18/2015 11:16   US Abdomen Limited Ruq  12/18/2015  CLINICAL DATA:  Chest pain for 1 week EXAM: US ABDOMEN LIMITED - RIGHT UPPER QUADRANT COMPARISON:  None. FINDINGS: Gallbladder: No gallstones or wall thickening visualized. No sonographic Murphy sign noted by sonographer. Common bile duct: Diameter: 4.2 mm Liver: No focal lesion identified. Within  normal limits in parenchymal echogenicity. IMPRESSION: Normal right upper quadrant ultrasound. Electronically Signed   By: Lajean Manes M.D.   On: 12/18/2015 14:52    ECG  EKG shows NSR with nonspecific ST abnormalities.   Echocardiogram - pending  ASSESSMENT AND PLAN  Principal Problem:   Unstable angina (HCC) Active Problems:   Chest pain   HTN (hypertension)   Asthma   GERD (gastroesophageal reflux disease)   HLD (hyperlipidemia)   OA (osteoarthritis)    Patient's symptomatolgy is very concerning for cardiac etiology/ unstable angina, with progressive exertional dyspnea and resting CP, also exacerbated by exertion. She has multiple cardias risk factors including strong family h/o premature CAD in 1st degree relatives (biological mother with CAD/ MI in her early 56s), HTN, HLD, medical noncompliance with home meds and 2nd hand smoke exposure over many years. RUQ [place] is negative for gallbladder pathology. CXR is unremarkable. EKG non acute. Initial troponin is negative. Symptomatology is very concerning for cardiac etiology. For this reason, would recommend definitive LHC in the am. Given her hypotensive response to SL NTG, would use caution with further use of NTG, if possible. Recommend ASA, high intensity statin and BB, if BP allows. Fasting lipid panel in the am. Recommend screening for DM with a Hgb A1c. Continue to cycle troponin's x 3. Start IV heparin if she rules in for NSTEMI. 2D echo to assess LVF, wall motion and valve anatomy. We also discussed the importance of full medication compliance with her antihypertensives and lipid lowering meds for risk reduction. Keep NPO after midnight. MD to assess and will provide further recommendations.   Signed, Lyda Jester, PA-C 12/18/2015, 5:22 PM

## 2015-12-19 NOTE — Progress Notes (Signed)
Held AM meds per [place]. - PA

## 2015-12-20 DIAGNOSIS — R0602 Shortness of breath: Secondary | ICD-10-CM | POA: Diagnosis present

## 2015-12-20 DIAGNOSIS — R0789 Other chest pain: Secondary | ICD-10-CM

## 2015-12-20 DIAGNOSIS — J4520 Mild intermittent asthma, uncomplicated: Secondary | ICD-10-CM

## 2015-12-20 DIAGNOSIS — I10 Essential (primary) hypertension: Secondary | ICD-10-CM

## 2015-12-20 DIAGNOSIS — R5382 Chronic fatigue, unspecified: Secondary | ICD-10-CM

## 2015-12-20 DIAGNOSIS — R21 Rash and other nonspecific skin eruption: Secondary | ICD-10-CM | POA: Diagnosis present

## 2015-12-20 DIAGNOSIS — R5383 Other fatigue: Secondary | ICD-10-CM | POA: Diagnosis present

## 2015-12-20 LAB — TSH: TSH: 6.259 u[IU]/mL — ABNORMAL HIGH (ref 0.350–4.500)

## 2015-12-20 LAB — LIPID PANEL
CHOL/HDL RATIO: 4.5 ratio
Cholesterol: 210 mg/dL — ABNORMAL HIGH (ref 0–200)
HDL: 47 mg/dL (ref >40)
LDL CALC: 138 mg/dL — AB (ref 0–99)
TRIGLYCERIDES: 124 mg/dL (ref <150)
VLDL: 25 mg/dL (ref 0–40)

## 2015-12-20 LAB — SEDIMENTATION RATE: Sed Rate: 24 mm/hr — ABNORMAL HIGH (ref 0–22)

## 2015-12-20 LAB — D-DIMER, QUANTITATIVE: D-Dimer, Quant: <0.27 ug/mL-FEU (ref 0.00–0.50)

## 2015-12-20 NOTE — Discharge Instructions (Signed)
Chest Wall Pain °Chest wall pain is pain in or around the bones and muscles of your chest. Sometimes, an injury causes this pain. Sometimes, the cause may not be known. This pain may take several weeks or longer to get better. °HOME CARE INSTRUCTIONS  °Pay attention to any changes in your symptoms. Take these actions to help with your pain:  °· Rest as told by your health care provider.   °· Avoid activities that cause pain. These include any activities that use your chest muscles or your abdominal and side muscles to lift heavy items.    °· If directed, apply ice to the painful area: °¨ Put ice in a plastic bag. °¨ Place a towel between your skin and the bag. °¨ Leave the ice on for 20 minutes, 2-3 times per day. °· Take over-the-counter and prescription medicines only as told by your health care provider. °· Do not use tobacco products, including cigarettes, chewing tobacco, and e-cigarettes. If you need help quitting, ask your health care provider. °· Keep all follow-up visits as told by your health care provider. This is important. °SEEK MEDICAL CARE IF: °· You have a fever. °· Your chest pain becomes worse. °· You have new symptoms. °SEEK IMMEDIATE MEDICAL CARE IF: °· You have nausea or vomiting. °· You feel sweaty or light-headed. °· You have a cough with phlegm (sputum) or you cough up blood. °· You develop shortness of breath. °  °This information is not intended to replace advice given to you by your health care provider. Make sure you discuss any questions you have with your health care provider. °  °Document Released: 05/24/2005 Document Revised: 02/12/2015 Document Reviewed: 08/19/2014 °Elsevier Interactive Patient Education ©2016 Elsevier Inc[employer] ° °Chest Pain Observation °It is often hard to give a specific diagnosis for the cause of chest pain. Among other possibilities your symptoms might be caused by inadequate oxygen delivery to your heart (angina). Angina that is not treated or evaluated can lead to  a heart attack (myocardial infarction) or death. °Blood tests, electrocardiograms, and X-rays may have been done to help determine a possible cause of your chest pain. After evaluation and observation, your health care provider has determined that it is unlikely your pain was caused by an unstable condition that requires hospitalization. However, a full evaluation of your pain may need to be completed, with additional diagnostic testing as directed. It is very important to keep your follow-up appointments. Not keeping your follow-up appointments could result in permanent heart damage, disability, or death. If there is any problem keeping your follow-up appointments, you must call your health care provider. °HOME CARE INSTRUCTIONS  °Due to the slight chance that your pain could be angina, it is important to follow your health care provider's treatment plan and also maintain a healthy lifestyle: °· Maintain or work toward achieving a healthy weight. °· Stay physically active and exercise regularly. °· Decrease your salt intake. °· Eat a balanced, healthy diet. Talk to a dietitian to learn about heart-healthy foods. °· Increase your fiber intake by including whole grains, vegetables, fruits, and nuts in your diet. °· Avoid situations that cause stress, anger, or depression. °· Take medicines as advised by your health care provider. Report any side effects to your health care provider. Do not stop medicines or adjust the dosages on your own. °· Quit smoking. Do not use nicotine patches or gum until you check with your health care provider. °· Keep your blood pressure, blood sugar, and cholesterol levels within normal   limits.  Limit alcohol intake to no more than 1 drink per day for women who are not pregnant and 2 drinks per day for men.  Do not abuse drugs. SEEK IMMEDIATE MEDICAL CARE IF: You have severe chest pain or pressure which may include symptoms such as:  You feel pain or pressure in your arms, neck,  jaw, or back.  You have severe back or abdominal pain, feel sick to your stomach (nauseous), or throw up (vomit).  You are sweating profusely.  You are having a fast or irregular heartbeat.  You feel short of breath while at rest.  You notice increasing shortness of breath during rest, sleep, or with activity.  You have chest pain that does not get better after rest or after taking your usual medicine.  You wake from sleep with chest pain.  You are unable to sleep because you cannot breathe.  You develop a frequent cough or you are coughing up blood.  You feel dizzy, faint, or experience extreme fatigue.  You develop severe weakness, dizziness, fainting, or chills. Any of these symptoms may represent a serious problem that is an emergency. Do not wait to see if the symptoms will go away. Call your local emergency services (911 in the [place] Do not drive yourself to the hospital. MAKE SURE YOU:  Understand these instructions.  Will watch your condition.  Will get help right away if you are not doing well or get worse.   This information is not intended to replace advice given to you by your health care provider. Make sure you discuss any questions you have with your health care provider.   Document Released: 06/26/2010 Document Revised: 05/29/2013 Document Reviewed: 11/23/2012 Elsevier Interactive Patient Education [employer].

## 2015-12-20 NOTE — Progress Notes (Signed)
Pt d/c'd home with daughter. D/c instructions given to and d/c with daughter-verbalizes understanding

## 2015-12-20 NOTE — Discharge Summary (Signed)
Physician Discharge Summary  Brenda Bautista:[id number] DOB: 27-Feb-1946 DOA: 12/18/2015  PCP: Lujean Amel, MD  Admit date: 12/18/2015 Discharge date: 12/20/2015   Recommendations for Outpatient Follow-Up:   1. Please follow-up on outstanding test results including TSH, ESR, and ANA. Needs further rheumatologic evaluation for the possibility of an autoimmune condition.   Discharge Diagnosis:   Principal Problem:    Atypical chest pain Active Problems:    Essential hypertension    Asthma    GERD (gastroesophageal reflux disease)    Hyperlipidemia with target LDL less than 100    OA (osteoarthritis)   Discharge disposition:  Home.    Discharge Condition: Stable.  Diet recommendation: Low sodium, heart healthy.    History of Present Illness:   Brenda Bautista is an 70 y.o. female with a PMH of hypertension, hyperlipidemia and GERD who was admitted 12/18/15 with chest pain concerning for unstable angina.   Hospital Course by Problem:    Principal Problem:  Atypical chest pain Patient with multiple cardias risk factors including strong family h/o premature CAD in 1st degree relatives (biological mother with CAD/ MI in her early 13s), HTN, HLD, medical noncompliance with home meds and 2nd hand smoke exposure over many years. Cardiologist following with recommendations to proceed with cardiac cath, which was done and showed no evidence of coronary blockages. Troponins negative. 2-D echocardiogram showed grade 1 diastolic dysfunction with no regional wall motion abnormalities. EF was normal. At this point, the patient's chest pain does not appear to be cardiac.  Active Problems:   Malar rash with chest pain, fatigue and shortness of breath Findings are concerning for underlying autoimmune disease such as lupus. Dermatomyositis and polymyalgia rheumatica are also in the differential. ESR and ANA were checked prior to discharge. Results were pending at  discharge. The patient was assured that she did not have a life-threatening condition, and that further evaluation could be done as an outpatient. It is interesting to note that the patient was treated for a short course with prednisone which seemed to improve her symptoms. Recommend full rheumatologic evaluation if ESR or ANA are abnormal. Thyroid function also checked prior to discharge. This will need to be followed up on. Patient may have underlying hypothyroidism. D-dimer was negative.   Essential hypertension Continue Norvasc.   Asthma No current wheeze.   GERD (gastroesophageal reflux disease) Continue PPI.   Hyperlipidemia with target LDL less than 100 Continue Lipitor.   OA (osteoarthritis) Symptomatic treatment as needed.   Medical Consultants:    Cardiology   Discharge Exam:   Filed Vitals:   12/20/15 0532 12/20/15 1231  BP: 115/47 130/57  Pulse: 59 61  Temp: 98.3 F (36.8 C) 98.2 F (36.8 C)  Resp: 16 18   Filed Vitals:   12/19/15 2100 12/19/15 2349 12/20/15 0532 12/20/15 1231  BP: 120/46 120/46 115/47 130/57  Pulse: 70 66 59 61  Temp: 98.2 F (36.8 C) 97.7 F (36.5 C) 98.3 F (36.8 C) 98.2 F (36.8 C)  TempSrc: Oral Oral Oral Oral  Resp: 16 17 16 18   Height:      Weight:   62.234 kg (137 lb 3.2 oz)   SpO2: 95% 93% 94% 98%   General exam: Appears calm and comfortable.  Respiratory system: Clear to auscultation. Respiratory effort normal. Cardiovascular system: S1 & S2 heard, RRR. No JVD, rubs, gallops or clicks. No murmurs. Gastrointestinal system: Abdomen is nondistended, soft and nontender. No organomegaly or masses felt. Normal bowel sounds heard. Central nervous  system: Alert and oriented. No focal neurological deficits. Extremities: No clubbing, edema, or cyanosis. Skin: Malar rash. Psychiatry: Judgement and insight appear normal. Mood & affect appropriate.   The results of significant diagnostics from this hospitalization (including  imaging, microbiology, ancillary and laboratory) are listed below for reference.     Procedures and Diagnostic Studies:   Dg Chest 2 View  12/18/2015  CLINICAL DATA:  70 year old female with left-sided chest pain for 2 weeks. Associated shortness of breath. EXAM: CHEST  2 VIEW COMPARISON:  11/27/2014 and prior chest radiographs FINDINGS: Cardiomegaly again noted. Mild interstitial prominence is unchanged. There is no evidence of focal airspace disease, pulmonary edema, suspicious pulmonary nodule/mass, pleural effusion, or pneumothorax. No acute bony abnormalities are identified. IMPRESSION: Cardiomegaly without evidence of acute cardiopulmonary disease. Electronically Signed   By: Margarette Canada M.D.   On: 12/18/2015 11:16   US Abdomen Limited Ruq  12/18/2015  CLINICAL DATA:  Chest pain for 1 week EXAM: US ABDOMEN LIMITED - RIGHT UPPER QUADRANT COMPARISON:  None. FINDINGS: Gallbladder: No gallstones or wall thickening visualized. No sonographic Murphy sign noted by sonographer. Common bile duct: Diameter: 4.2 mm Liver: No focal lesion identified. Within normal limits in parenchymal echogenicity. IMPRESSION: Normal right upper quadrant ultrasound. Electronically Signed   By: Lajean Manes M.D.   On: 12/18/2015 14:52     Labs:   Basic Metabolic Panel:  Recent Labs Lab 12/18/15 1011 12/18/15 2048  NA 137 132*  K 4.3 4.0  CL 104 99*  CO2 23 20*  GLUCOSE 97 164*  BUN 12 15  CREATININE 0.96 1.20*  CALCIUM 9.4 9.3   GFR Estimated Creatinine Clearance: 34.9 mL/min (by C-G formula based on Cr of 1.2). Liver Function Tests: No results for input(s): AST, ALT, ALKPHOS, BILITOT, PROT, ALBUMIN in the last 168 hours. No results for input(s): LIPASE, AMYLASE in the last 168 hours. No results for input(s): AMMONIA in the last 168 hours. Coagulation profile  Recent Labs Lab 12/18/15 2048  INR 1.15    CBC:  Recent Labs Lab 12/18/15 1011 12/18/15 2048  WBC 10.1 16.6*  HGB 14.0 13.1  HCT  41.9 38.5  MCV 90.3 90.2  PLT 307 290   Cardiac Enzymes:  Recent Labs Lab 12/18/15 1315 12/18/15 1934 12/19/15 0047  TROPONINI <0.03 0.03* 0.03*   D-Dimer  Recent Labs  12/20/15 1222  DDIMER <0.27   Lipid Profile  Recent Labs  12/20/15 0408  CHOL 210*  HDL 47  LDLCALC 138*  TRIG 124  CHOLHDL 4.5   Thyroid function studies  Recent Labs  12/20/15 1500  TSH 6.259*     Discharge Instructions:       Discharge Instructions    Call MD for:  extreme fatigue    Complete by:  As directed      Call MD for:  severe uncontrolled pain    Complete by:  As directed      Diet - low sodium heart healthy    Complete by:  As directed      Discharge instructions    Complete by:  As directed   Your primary care doctor can follow up on the outstanding tests (we checked a TSH to evaluate your thyroid, and an ESR and ANA to screen for autoimmune problems).  It is very important you follow up to receive the results of these tests (remember, you have been through labor 5 times, and your children would be happy to drive you to your doctor's appointments).  In the meantime, if you feel short of breath, limit your activities until you feel less breathless.  Try tylenol or Motrin for aches in the chest.  You can use Mylanta for indigestion.     Increase activity slowly    Complete by:  As directed             Medication List    TAKE these medications        albuterol 108 (90 Base) MCG/ACT inhaler  Commonly known as:  PROVENTIL HFA;VENTOLIN HFA  Inhale 2 puffs into the lungs every 6 (six) hours as needed for wheezing or shortness of breath.     ALPRAZolam 0.25 MG tablet  Commonly known as:  XANAX  Take 0.25 mg by mouth 2 (two) times daily as needed for anxiety.     amLODipine 2.5 MG tablet  Commonly known as:  NORVASC  Take 2.5 mg by mouth daily.     aspirin 81 MG tablet  Take 81 mg by mouth daily.     atorvastatin 20 MG tablet  Commonly known as:  LIPITOR  Take 20  mg by mouth daily.     buPROPion 300 MG 24 hr tablet  Commonly known as:  WELLBUTRIN XL  Take 300 mg by mouth daily.     carboxymethylcellulose 0.5 % Soln  Commonly known as:  REFRESH PLUS  Place 1 drop into both eyes at bedtime.     diphenhydrAMINE 25 MG tablet  Commonly known as:  BENADRYL  Take 25 mg by mouth every 6 (six) hours as needed.     meclizine 25 MG tablet  Commonly known as:  ANTIVERT  Take 25 mg by mouth 3 (three) times daily as needed for dizziness.     multivitamin with minerals tablet  Take 1 tablet by mouth daily.     omeprazole 20 MG capsule  Commonly known as:  PRILOSEC  Take 20 mg by mouth daily.     PROZAC 40 MG capsule  Generic drug:  FLUoxetine  Take 40 mg by mouth daily.       Follow-up Information    Follow up with Lujean Amel, MD.   Specialty:  Family Medicine   Contact information:   [place] [facility] [place] [place] [address] [phone]        Time coordinating discharge: 35 minutes.  Signed:  Carry Weesner  Pager [phone] [facility] 12/20/2015, 7:57 PM

## 2015-12-20 NOTE — Progress Notes (Signed)
Normal left cardiac cath and normal LFEF, the patient can be discharged from cardiac standpoint. Consider non-cardiac sources of chest pain.   Ena Dawley 12/20/2015

## 2015-12-20 NOTE — Progress Notes (Signed)
Pt staying with son few days post d/c- son Jeneen Rinks

## 2015-12-22 LAB — FANA STAINING PATTERNS

## 2015-12-22 LAB — ANTINUCLEAR ANTIBODIES, IFA: ANTINUCLEAR ANTIBODIES, IFA: POSITIVE — AB

## 2015-12-23 DIAGNOSIS — R768 Other specified abnormal immunological findings in serum: Secondary | ICD-10-CM | POA: Diagnosis not present

## 2015-12-23 DIAGNOSIS — F419 Anxiety disorder, unspecified: Secondary | ICD-10-CM | POA: Diagnosis not present

## 2015-12-23 DIAGNOSIS — F329 Major depressive disorder, single episode, unspecified: Secondary | ICD-10-CM | POA: Diagnosis not present

## 2015-12-23 DIAGNOSIS — I10 Essential (primary) hypertension: Secondary | ICD-10-CM | POA: Diagnosis not present

## 2015-12-23 DIAGNOSIS — F321 Major depressive disorder, single episode, moderate: Secondary | ICD-10-CM | POA: Diagnosis not present

## 2015-12-31 DIAGNOSIS — R748 Abnormal levels of other serum enzymes: Secondary | ICD-10-CM | POA: Diagnosis not present

## 2016-01-23 ENCOUNTER — Ambulatory Visit (INDEPENDENT_AMBULATORY_CARE_PROVIDER_SITE_OTHER): Payer: Commercial Managed Care - HMO | Admitting: Physician Assistant

## 2016-01-23 ENCOUNTER — Encounter: Payer: Self-pay | Admitting: Physician Assistant

## 2016-01-23 VITALS — BP 124/68 | HR 60 | Ht 60.0 in | Wt 137.2 lb

## 2016-01-23 DIAGNOSIS — R7989 Other specified abnormal findings of blood chemistry: Secondary | ICD-10-CM

## 2016-01-23 DIAGNOSIS — E785 Hyperlipidemia, unspecified: Secondary | ICD-10-CM

## 2016-01-23 DIAGNOSIS — R0602 Shortness of breath: Secondary | ICD-10-CM

## 2016-01-23 DIAGNOSIS — R0789 Other chest pain: Secondary | ICD-10-CM | POA: Diagnosis not present

## 2016-01-23 DIAGNOSIS — R946 Abnormal results of thyroid function studies: Secondary | ICD-10-CM | POA: Diagnosis not present

## 2016-01-23 MED ORDER — ATORVASTATIN CALCIUM 40 MG PO TABS: 40.0000 mg | ORAL_TABLET | Freq: Every day | ORAL | 11 refills | Status: DC

## 2016-01-23 NOTE — Patient Instructions (Signed)
Medications  Your physician has recommended you make the following change in your medication:   Increase Atorvastatin to 40 mg daily   Lab work  Your physician recommends that you return for a FASTING lipid profile and Hepatic function testing In 3 months.   Referral  You have been referred to Pulmonology for shortness of breath.    Follow-up  Your physician wants you to follow-up in: 9-12 months with Dr. Oval Linsey. You will receive a reminder letter in the mail two months in advance. If you don't receive a letter, please call our office to schedule the follow-up appointment.  If you need a refill on your cardiac medications before your next appointment, please call your pharmacy.

## 2016-01-23 NOTE — Progress Notes (Signed)
Cardiology Office Note    Date:  01/23/2016   ID:  Brenda Bautista, DOB [birth year], MRN [id number]  PCP:  Lujean Amel, MD  Cardiologist:  Dr. Oval Linsey  Chief Complaint  Patient presents with  . Hospitalization Follow-up    seen for Dr. Oval Linsey    History of Present Illness:  Brenda Bautista is a 70 y.o. female with PMH of HTN, HLD, h/o med noncompliance and strong FHx of premature CAD presented in July with intermittent chest pain. She was also admitted for possible abdominal and biliary issues by internal medicine service. Limited abdominal ultrasound obtained on 7/13 was negative for right upper quadrant etiology. Given his strong family history and concerning symptom, she underwent cardiac catheterization on 12/19/2015 which showed angiographically minimal CAD, normal LVEF with normal LVEDP. Echocardiogram obtained on the same day showed EF [id number], grade 1 diastolic dysfunction, no regional wall motion abnormality, normal valvular function. Lipid panel obtained on 7/15 showed cholesterol 210, LDL 138, HDL 47, triglycerides 124. Her TSH was mildly elevated at 6.259. She is pending further evaluation as outpatient for autoimmune disease. Her appt with rheumatology is in Sept. Her d-dimer prior to discharge was normal.   Since she left the hospital, she continued to have pain in the substernal area worse with deep inspiration, palpation and food. She also complained of left-sided neck pain radiating down the left arm. She has no prior imaging for this and no history of pinched nerve. I have asked the patient to continue to follow-up with her PCP on this. Given her elevated LDL during the recent admission, I will increase her Lipitor to 40 mg daily. We will obtain repeat fasting lipid panel and LFTs in 3 month. She continued to have shortness of breath, family is concerned of pulmonary issues since she has been exposed to many years of secondhand smoking from her husband. I will  refer her to pulmonology service.     Past Medical History:  Diagnosis Date  . Allergic rhinitis   . Anxiety   . Arthritis   . Asthma   . Cataract   . Depression   . Hyperlipemia   . Hypertension   . Sinusitis   . Vitamin D deficiency     Past Surgical History:  Procedure Laterality Date  . CARDIAC CATHETERIZATION N/A 12/19/2015   Procedure: Left Heart Cath and Coronary Angiography;  Surgeon: Leonie Man, MD;  Location: [facility];  Service: Cardiovascular;  Laterality: N/A;    Current Medications: Outpatient Medications Prior to Visit  Medication Sig Dispense Refill  . albuterol (PROVENTIL HFA;VENTOLIN HFA) 108 (90 Base) MCG/ACT inhaler Inhale 2 puffs into the lungs every 6 (six) hours as needed for wheezing or shortness of breath.    . ALPRAZolam (XANAX) 0.25 MG tablet Take 0.25 mg by mouth 2 (two) times daily as needed for anxiety.    Marland Kitchen amLODipine (NORVASC) 2.5 MG tablet Take 2.5 mg by mouth daily.    Marland Kitchen aspirin 81 MG tablet Take 81 mg by mouth daily.    Marland Kitchen buPROPion (WELLBUTRIN XL) 300 MG 24 hr tablet Take 300 mg by mouth daily.    . carboxymethylcellulose (REFRESH PLUS) 0.5 % SOLN Place 1 drop into both eyes at bedtime.    . diphenhydrAMINE (BENADRYL) 25 MG tablet Take 25 mg by mouth every 6 (six) hours as needed.    Marland Kitchen FLUoxetine (PROZAC) 40 MG capsule Take 40 mg by mouth daily.    . meclizine (ANTIVERT) 25 MG tablet  Take 25 mg by mouth 3 (three) times daily as needed for dizziness.    . Multiple Vitamins-Minerals (MULTIVITAMIN WITH MINERALS) tablet Take 1 tablet by mouth daily.    Marland Kitchen omeprazole (PRILOSEC) 20 MG capsule Take 20 mg by mouth daily.    Marland Kitchen atorvastatin (LIPITOR) 20 MG tablet Take 20 mg by mouth daily.     No facility-administered medications prior to visit.      Allergies:   Codeine; Bactrim [sulfamethoxazole-trimethoprim]; Flagyl [metronidazole]; and Meloxicam   Social History   Social History  . Marital status: Married    Spouse name: N/A    . Number of children: N/A  . Years of education: N/A   Social History Main Topics  . Smoking status: Never Smoker  . Smokeless tobacco: Never Used  . Alcohol use No  . Drug use: No  . Sexual activity: Not Asked   Other Topics Concern  . None   Social History Narrative  . None     Family History:  The patient's family history includes Coronary artery disease (age of onset: 9) in her mother; Sick sinus syndrome in her mother.   ROS:   Please see the history of present illness.    ROS All other systems reviewed and are negative.   PHYSICAL EXAM:   VS:  BP 124/68   Pulse 60   Ht 5' (1.524 m)   Wt 137 lb 3.2 oz (62.2 kg)   SpO2 97%   BMI 26.80 kg/m    GEN: Well nourished, well developed, in no acute distress  HEENT: normal  Neck: no JVD, carotid bruits, or masses Cardiac: RRR; no murmurs, rubs, or gallops,no edema +Pain with palpation in the epigastric and substernal region Respiratory:  clear to auscultation bilaterally, normal work of breathing GI: soft, nontender, nondistended, + BS MS: no deformity or atrophy  Skin: warm and dry, no rash Neuro:  Alert and Oriented x 3, Strength and sensation are intact Psych: euthymic mood, full affect  Wt Readings from Last 3 Encounters:  01/23/16 137 lb 3.2 oz (62.2 kg)  12/20/15 137 lb 3.2 oz (62.2 kg)      Studies/Labs Reviewed:   EKG:  EKG is not ordered today.   Recent Labs: 12/18/2015: B Natriuretic Peptide 17.5; BUN 15; Creatinine, Ser 1.20; Hemoglobin 13.1; Platelets 290; Potassium 4.0; Sodium 132 12/20/2015: TSH 6.259   Lipid Panel    Component Value Date/Time   CHOL 210 (H) 12/20/2015 0408   TRIG 124 12/20/2015 0408   HDL 47 12/20/2015 0408   CHOLHDL 4.5 12/20/2015 0408   VLDL 25 12/20/2015 0408   LDLCALC 138 (H) 12/20/2015 0408    Additional studies/ records that were reviewed today include:   Limited RUQ U/S: Normal right upper quadrant ultrasound.  Cath 7/14/017 Conclusion   1. Angiographically  minimal CAD 2. The left ventricular systolic function is normal.    No angiographic evidence of any significant CAD to explain the patient's symptoms. Consider nonanginal chest pain. Preserved LVEF with normal EDP.    Echo 12/19/2015 LV EF: 60% -   65%  Study Conclusions  - Left ventricle: The cavity size was normal. Wall thickness was   normal. Systolic function was normal. The estimated ejection   fraction was in the range of 60% to 65%. Wall motion was normal;   there were no regional wall motion abnormalities. Doppler   parameters are consistent with abnormal left ventricular   relaxation (grade 1 diastolic dysfunction).   ASSESSMENT:  1. SOB (shortness of breath)   2. Hyperlipidemia with target LDL less than 100   3. Elevated TSH   4. Atypical chest pain      PLAN:  In order of problems listed above:  1. Chest pain: noncardiac in etiology. Symptom is very atypical, pain in the substernal area worse with palpation, deep inspiration and food. Negative calf and a normal echocardiogram on recent admission, no further workup is necessary. Follow-up with cardiology in 9-12 months.  2. SOB with exertion: She had negative d-dimer on recent hospitalization, cardiac workup has since been negative. Other alternative explanation for dyspnea on exertion include old-age/deconditioning versus pulmonary issue. I will refer her to pulmonology for further diagnosis. She says her shortness of breath has been noticeably worsening for the past several month.  3. Abnormal ANA: Patient has follow-up with rheumatology in September.  4. Elevated TSH: Patient is aware of this elevated TSH, further workup per PCP.  5. Hyperlipidemia: lipitor recently increased to 40mg  daily, repeat LFT and FLP in 2 month.     Medication Adjustments/Labs and Tests Ordered: Current medicines are reviewed at length with the patient today.  Concerns regarding medicines are outlined above.  Medication changes,  Labs and Tests ordered today are listed in the Patient Instructions below. Patient Instructions  Medications  Your physician has recommended you make the following change in your medication:   Increase Atorvastatin to 40 mg daily   Lab work  Your physician recommends that you return for a FASTING lipid profile and Hepatic function testing In 3 months.   Referral  You have been referred to Pulmonology for shortness of breath.    Follow-up  Your physician wants you to follow-up in: 9-12 months with Dr. Oval Linsey. You will receive a reminder letter in the mail two months in advance. If you don't receive a letter, please call our office to schedule the follow-up appointment.  If you need a refill on your cardiac medications before your next appointment, please call your pharmacy.        Hilbert Corrigan, [place]  01/23/2016 9:50 AM    [address], [place], [place]  [address] Phone: [phone]; Fax: [phone]

## 2016-02-06 DIAGNOSIS — R21 Rash and other nonspecific skin eruption: Secondary | ICD-10-CM | POA: Diagnosis not present

## 2016-02-06 DIAGNOSIS — R5383 Other fatigue: Secondary | ICD-10-CM | POA: Diagnosis not present

## 2016-02-06 DIAGNOSIS — M3500 Sicca syndrome, unspecified: Secondary | ICD-10-CM | POA: Diagnosis not present

## 2016-02-06 DIAGNOSIS — R768 Other specified abnormal immunological findings in serum: Secondary | ICD-10-CM | POA: Diagnosis not present

## 2016-02-06 DIAGNOSIS — M545 Low back pain: Secondary | ICD-10-CM | POA: Diagnosis not present

## 2016-02-06 DIAGNOSIS — M542 Cervicalgia: Secondary | ICD-10-CM | POA: Diagnosis not present

## 2016-02-17 ENCOUNTER — Encounter: Payer: Self-pay | Admitting: Pulmonary Disease

## 2016-02-17 ENCOUNTER — Ambulatory Visit (INDEPENDENT_AMBULATORY_CARE_PROVIDER_SITE_OTHER): Payer: Commercial Managed Care - HMO | Admitting: Pulmonary Disease

## 2016-02-17 VITALS — BP 122/74 | HR 58 | Ht 59.0 in | Wt 138.6 lb

## 2016-02-17 DIAGNOSIS — R0600 Dyspnea, unspecified: Secondary | ICD-10-CM

## 2016-02-17 DIAGNOSIS — R0689 Other abnormalities of breathing: Secondary | ICD-10-CM

## 2016-02-17 NOTE — Patient Instructions (Signed)
We will start you on chlorpheniramine 8 mg 3 times a day and dymista nasal spray. Schedule for pulmonary function test Continue using the albuterol rescue inhaler and Prilosec.  Return to clinic in 1 month for further assessment and follow-up.

## 2016-02-17 NOTE — Progress Notes (Signed)
Brenda Bautista    [id number]    1946/02/27  Primary Care Physician:KOIRALA,DIBAS, MD  Referring Physician: Lujean Amel, MD Brenda Bautista [address], [place] [address]  Chief complaint:  Consult for evaluation of dyspnea.  HPI: Brenda Bautista is a 70 year old with past medical history of asthma.  She has a chief complaint is dyspnea on rest and exertion associated with occasional wheeze. She has daily cough, nonproductive in nature. She has significant issues with acid reflux and is currently on Prilosec 40 a day. She was diagnosed with asthma many years ago and is currently on an albuterol rescue inhaler which she just uses several times daily. She has seasonal allergies which is worse in spring and fall and eports sensitivity to tree pollen, dust. She does not have any pets at home, no mold issues.  She has been evaluated by cardiology for atypical chest pain. She is also noted to have mildly elevated ANA and has been evaluated by rheumatology. There is no evidence of ILD on previous imaging of her history.   Outpatient Encounter Prescriptions as of 02/17/2016  Medication Sig  . albuterol (PROVENTIL HFA;VENTOLIN HFA) 108 (90 Base) MCG/ACT inhaler Inhale 2 puffs into the lungs every 6 (six) hours as needed for wheezing or shortness of breath.  . ALPRAZolam (XANAX) 0.25 MG tablet Take 0.25 mg by mouth 2 (two) times daily as needed for anxiety.  Marland Kitchen amLODipine (NORVASC) 2.5 MG tablet Take 2.5 mg by mouth daily.  Marland Kitchen aspirin 81 MG tablet Take 81 mg by mouth daily.  Marland Kitchen atorvastatin (LIPITOR) 40 MG tablet Take 1 tablet (40 mg total) by mouth daily.  . calcium carbonate 1250 MG capsule Take 1,250 mg by mouth daily.  . carboxymethylcellulose (REFRESH PLUS) 0.5 % SOLN Place 1 drop into both eyes at bedtime.  . cholecalciferol (VITAMIN D) 1000 units tablet Take 1,000 Units by mouth daily.  Marland Kitchen FLUoxetine (PROZAC) 40 MG capsule Take 40 mg by mouth daily.  . meclizine  (ANTIVERT) 25 MG tablet Take 25 mg by mouth 3 (three) times daily as needed for dizziness.  . Multiple Vitamins-Minerals (MULTIVITAMIN WITH MINERALS) tablet Take 1 tablet by mouth daily.  Marland Kitchen omeprazole (PRILOSEC) 40 MG capsule Take 40 mg by mouth daily.  . diphenhydrAMINE (BENADRYL) 25 MG tablet Take 25 mg by mouth every 6 (six) hours as needed.  . [DISCONTINUED] buPROPion (WELLBUTRIN XL) 300 MG 24 hr tablet Take 300 mg by mouth daily.  . [DISCONTINUED] omeprazole (PRILOSEC) 20 MG capsule Take 20 mg by mouth daily.   No facility-administered encounter medications on file as of 02/17/2016.     Allergies as of 02/17/2016 - Review Complete 02/17/2016  Allergen Reaction Noted  . Codeine  12/18/2015  . Bactrim [sulfamethoxazole-trimethoprim] Rash 12/18/2015  . Flagyl [metronidazole] Rash 12/18/2015  . Meloxicam Other (See Comments) 12/18/2015    Past Medical History:  Diagnosis Date  . Allergic rhinitis   . Anxiety   . Arthritis   . Asthma   . Cataract   . Depression   . Hyperlipemia   . Hypertension   . Sinusitis   . Vitamin D deficiency     Past Surgical History:  Procedure Laterality Date  . CARDIAC CATHETERIZATION N/A 12/19/2015   Procedure: Left Heart Cath and Coronary Angiography;  Surgeon: Brenda Man, MD;  Location: [facility];  Service: Cardiovascular;  Laterality: N/A;  . CATARACT EXTRACTION, BILATERAL Right 06/2015  . CATARACT EXTRACTION, BILATERAL  Left 07/2015    Family History  Problem Relation Age of Onset  . Coronary artery disease Mother 92    MI in early 78s  . Sick sinus syndrome Mother     required PPM  . Emphysema Father   . Asthma Father   . Heart attack Father     Social History   Social History  . Marital status: Married    Spouse name: N/A  . Number of children: N/A  . Years of education: N/A   Occupational History  . Not on file.   Social History Main Topics  . Smoking status: Never Smoker  . Smokeless tobacco: Never Used    . Alcohol use No  . Drug use: No  . Sexual activity: Not on file   Other Topics Concern  . Not on file   Social History Narrative   Widowed, spouse Brenda Bautista passed away September 18, 2014 (he was a former patient of Brenda Bautista and Brenda Edison NP)   5 children, all live in [place]: retired, worked in a [place]: Review of Systems  Constitutional: Negative for fever and chills.  HENT: Negative.   Eyes: Negative for blurred vision.  Respiratory: as per HPI  Cardiovascular: Negative for chest pain and palpitations.  Gastrointestinal: Negative for vomiting, diarrhea, blood per rectum. Genitourinary: Negative for dysuria, urgency, frequency and hematuria.  Musculoskeletal: Negative for myalgias, back pain and joint pain.  Skin: Negative for itching and rash.  Neurological: Negative for dizziness, tremors, focal weakness, seizures and loss of consciousness.  Endo/Heme/Allergies: Negative for environmental allergies.  Psychiatric/Behavioral: Negative for depression, suicidal ideas and hallucinations.  All other systems reviewed and are negative.   Physical Exam: Blood pressure 122/74, pulse (!) 58, height 4\' 11"  (1.499 m), weight 138 lb 9.6 oz (62.9 kg), SpO2 99 %. Gen:      No acute distress HEENT:  EOMI, sclera anicteric Neck:     No masses; no thyromegaly Lungs:    Clear to auscultation bilaterally; normal respiratory effort CV:         Regular rate and rhythm; no murmurs Abd:      + bowel sounds; soft, non-tender; no palpable masses, no distension Ext:    No edema; adequate peripheral perfusion Skin:      Warm and dry; no rash Neuro: alert and oriented x 3 Psych: normal mood and affect  Data Reviewed: Echo 12/19/15 Left ventricle: The cavity size was normal. Wall thickness was normal. Systolic function was normal. The estimated ejection fraction was in the range of 60% to 65%. Wall motion was normal; there were no regional wall motion  abnormalities. Doppler parameters are consistent with abnormal left ventricular relaxation (grade 1 diastolic dysfunction).  CXR 12/18/15 Lungs are clear, no acute cardiopulmonary abnormality. Images reviewed.  Assessment:  Assessment for dyspnea.  She has history of asthma, allergies and symptoms may represent worsening of reactive airway disease. Although she is a nonsmoker she had been exposed to significant amounts of secondhand smoke and will need to get evaluated for presence of emphysema.  She has a significant component of upper airway cough syndrome from rhinitis, postnasal drip and has issues with acid reflux and is currently on Prilosec. I'll treat her stepwise by addressing her upper airway issues first. She will start on  chlorpheniramine 3 times a day and dymista nasal spray. She'll continue the albuterol inhaler and Prilosec. Schedule for pulmonary function test.  Return  to clinic in 1 month to assess response to therapy and further follow-up.  Plan/Recommendations: - Chlorpheniramine 8mg  tid, dymista nasal spray - Continue albuterol and prilosec, - PFTs  Marshell Garfinkel MD [facility] Pager [phone] 02/17/2016, 4:46 PM  CC: Brenda Amel, MD

## 2016-02-19 ENCOUNTER — Ambulatory Visit: Payer: Commercial Managed Care - HMO | Admitting: Cardiology

## 2016-02-20 MED ORDER — AZELASTINE-FLUTICASONE 137-50 MCG/ACT NA SUSP: 1.0000 | Freq: Two times a day (BID) | NASAL | 0 refills | Status: DC

## 2016-02-20 NOTE — Addendum Note (Signed)
Addended by: Parke Poisson E on: 02/20/2016 05:07 PM   Modules accepted: Orders

## 2016-02-25 ENCOUNTER — Telehealth: Payer: Self-pay | Admitting: Pulmonary Disease

## 2016-02-25 DIAGNOSIS — J4520 Mild intermittent asthma, uncomplicated: Secondary | ICD-10-CM

## 2016-02-25 NOTE — Telephone Encounter (Addendum)
Spoke with pt. She is needing to be scheduled for her PFT and 1 month ROV. This was not done at her last OV. PFT has been scheduled for 03/24/16 at 10am at [facility]. ROV has been scheduled for 03/24/16 at 11:15am. Nothing further was needed at this time.

## 2016-03-13 ENCOUNTER — Ambulatory Visit (INDEPENDENT_AMBULATORY_CARE_PROVIDER_SITE_OTHER): Payer: Commercial Managed Care - HMO

## 2016-03-13 ENCOUNTER — Encounter (HOSPITAL_COMMUNITY): Payer: Self-pay | Admitting: Emergency Medicine

## 2016-03-13 ENCOUNTER — Ambulatory Visit (HOSPITAL_COMMUNITY)
Admission: EM | Admit: 2016-03-13 | Discharge: 2016-03-13 | Disposition: A | Discharge status: Home / Self Care | Payer: Commercial Managed Care - HMO | Attending: Internal Medicine | Admitting: Internal Medicine

## 2016-03-13 DIAGNOSIS — S82832A Other fracture of upper and lower end of left fibula, initial encounter for closed fracture: Secondary | ICD-10-CM

## 2016-03-13 DIAGNOSIS — M7989 Other specified soft tissue disorders: Secondary | ICD-10-CM | POA: Diagnosis not present

## 2016-03-13 DIAGNOSIS — M25572 Pain in left ankle and joints of left foot: Secondary | ICD-10-CM | POA: Diagnosis not present

## 2016-03-13 NOTE — ED Notes (Signed)
Ortho tech present applying posterior short leg splint.

## 2016-03-13 NOTE — ED Notes (Addendum)
Pt updated on wait.  Pt has crutches at home per NP.

## 2016-03-13 NOTE — ED Notes (Signed)
Ortho tech paged  

## 2016-03-13 NOTE — Progress Notes (Signed)
Orthopedic Tech Progress Note Patient Details:  Brenda Bautista 12-08-1945 [id number]  Ortho Devices Type of Ortho Device: Ace wrap, Post (short leg) splint   [place] Pink 03/13/2016, 6:10 PM

## 2016-03-13 NOTE — ED Notes (Addendum)
Ortho tech notified of splint need.  Pt updated on wait.  LLE elevated with ice pack in place.

## 2016-03-13 NOTE — ED Provider Notes (Signed)
CSN: [id number]     Arrival date & time 03/13/16  1347 History   None    Chief Complaint  Patient presents with  . Ankle Pain  . Foot Pain   (Consider location/radiation/quality/duration/timing/severity/associated sxs/prior Treatment) Mrs. Hakola is a well-appearing 70 y.o female, presents today for left foot and ankle pain. Pain started last night. She was coming out of a restaurant and missed the curb when she was stepping down and fell. She denies LOC or head injury. She reports injuring her left foot and ankle. She reports pain at rest and with movement. She reports limited ROM. She reports swelling. She reports difficult to bear weight.       Past Medical History:  Diagnosis Date  . Allergic rhinitis   . Anxiety   . Arthritis   . Asthma   . Cataract   . Depression   . Hyperlipemia   . Hypertension   . Sinusitis   . Vitamin D deficiency    Past Surgical History:  Procedure Laterality Date  . CARDIAC CATHETERIZATION N/A 12/19/2015   Procedure: Left Heart Cath and Coronary Angiography;  Surgeon: Leonie Man, MD;  Location: [facility];  Service: Cardiovascular;  Laterality: N/A;  . CATARACT EXTRACTION, BILATERAL Right 06/2015  . CATARACT EXTRACTION, BILATERAL Left 07/2015   Family History  Problem Relation Age of Onset  . Coronary artery disease Mother 1    MI in early 62s  . Sick sinus syndrome Mother     required PPM  . Emphysema Father   . Asthma Father   . Heart attack Father    Social History  Substance Use Topics  . Smoking status: Never Smoker  . Smokeless tobacco: Never Used  . Alcohol use No   OB History    No data available     Review of Systems  All other systems reviewed and are negative.   Allergies  Codeine; Bactrim [sulfamethoxazole-trimethoprim]; Flagyl [metronidazole]; and Meloxicam  Home Medications   Prior to Admission medications   Medication Sig Start Date End Date Taking? Authorizing Provider  albuterol (PROVENTIL  HFA;VENTOLIN HFA) 108 (90 Base) MCG/ACT inhaler Inhale 2 puffs into the lungs every 6 (six) hours as needed for wheezing or shortness of breath.    Historical Provider, MD  ALPRAZolam Duanne Moron) 0.25 MG tablet Take 0.25 mg by mouth 2 (two) times daily as needed for anxiety.    Historical Provider, MD  amLODipine (NORVASC) 2.5 MG tablet Take 2.5 mg by mouth daily.    Historical Provider, MD  aspirin 81 MG tablet Take 81 mg by mouth daily.    Historical Provider, MD  atorvastatin (LIPITOR) 40 MG tablet Take 1 tablet (40 mg total) by mouth daily. 01/23/16   Almyra Deforest, PA  Azelastine-Fluticasone (DYMISTA) 137-50 MCG/ACT SUSP Place 1 spray into the nose 2 (two) times daily. 02/17/16 02/21/16  Praveen Mannam, MD  calcium carbonate 1250 MG capsule Take 1,250 mg by mouth daily.    Historical Provider, MD  carboxymethylcellulose (REFRESH PLUS) 0.5 % SOLN Place 1 drop into both eyes at bedtime.    Historical Provider, MD  cholecalciferol (VITAMIN D) 1000 units tablet Take 1,000 Units by mouth daily.    Historical Provider, MD  diphenhydrAMINE (BENADRYL) 25 MG tablet Take 25 mg by mouth every 6 (six) hours as needed.    Historical Provider, MD  FLUoxetine (PROZAC) 40 MG capsule Take 40 mg by mouth daily.    Historical Provider, MD  meclizine (ANTIVERT) 25 MG tablet  Take 25 mg by mouth 3 (three) times daily as needed for dizziness.    Historical Provider, MD  Multiple Vitamins-Minerals (MULTIVITAMIN WITH MINERALS) tablet Take 1 tablet by mouth daily.    Historical Provider, MD  omeprazole (PRILOSEC) 40 MG capsule Take 40 mg by mouth daily. 02/12/16   Historical Provider, MD   Meds Ordered and Administered this Visit  Medications - No data to display  BP (!) 152/106 (BP Location: Left Arm)   Pulse 64   Temp 97.9 F (36.6 C) (Oral)   Resp 18   Ht 4\' 11"  (1.499 m)   Wt 135 lb (61.2 kg)   SpO2 100%   BMI 27.27 kg/m  No data found.   Physical Exam  Constitutional: She appears well-developed and well-nourished.   Cardiovascular: Normal rate, regular rhythm and normal heart sounds.   Pulmonary/Chest: Effort normal and breath sounds normal. No respiratory distress. She has no wheezes.  Musculoskeletal:  Left ankle and foot are swollen with ecchymosis on the lateral aspect of the foot. She has severe pain on palpation over the whole foot and over the latera and medial aspect of the ankle. Her ROM is limited due to pain. She is not able to bear weight  Nursing note and vitals reviewed.   Urgent Care Course   Clinical Course    Procedures (including critical care time)  Labs Review Labs Reviewed - No data to display  Imaging Review Dg Ankle Complete Left  Result Date: 03/13/2016 CLINICAL DATA:  Twisted ankle yesterday.  Pain. EXAM: LEFT ANKLE COMPLETE - 3+ VIEW COMPARISON:  None. FINDINGS: There is no evidence of fracture, dislocation, or joint effusion. There is no evidence of arthropathy or other focal bone abnormality. Lateral soft tissue swelling. Plantar and Achilles spurs. IMPRESSION: Soft tissue swelling.  No fracture or dislocation. Electronically Signed   By: Staci Righter M.D.   On: 03/13/2016 16:12   Dg Foot Complete Left  Result Date: 03/13/2016 CLINICAL DATA:  70 year old female with a history of pain along the lateral foot EXAM: LEFT FOOT - COMPLETE 3+ VIEW COMPARISON:  None. FINDINGS: There appears to be an avulsion fracture at the distal tibia on the AP view the foot. This is not evident on the ankle plain film series. Soft tissue swelling. Degenerative changes of the forefoot, midfoot, hindfoot. No radiopaque foreign body. IMPRESSION: There appears to be a small avulsion fracture at the distal fibula which is only evident on one view, and not evident on the ankle series. Recommend correlation with point tenderness in this region. Degenerative changes. Signed, Dulcy Fanny. Earleen Newport, DO [facility] Electronically Signed   By: Corrie Mckusick D.O.   On: 03/13/2016 16:30      MDM   1. Other closed fracture of distal end of left fibula, initial encounter     Xray reveals a small avulsion fracture at the distal fibula that is only evident on one view and not evident on the ankle series and correlation with point tenderness in this region is recommended. Patient does have point tenderness over the area. A fracture is very likely. Short leg splint applied. Patient has crutches at home;  Use crutches for ambulation.  Take ibuprofen at home for pain. Patient referred to orthopedic. Informed to call first thing in the morning on Monday to make an appointment. Patient denies any questions. Discharge paperwork given.     Barry Dienes, NP 03/13/16 1711

## 2016-03-13 NOTE — ED Notes (Signed)
Ortho tech repaged 

## 2016-03-13 NOTE — ED Triage Notes (Signed)
Patient reports falling last night.  Patient missed curbing and fell.  Pain in left ankle and foot including heel.  Patient denies any other injury.  Ankle and foot is swollen.  Left pedal pulses 2 +able to move toes, no numbness or tingling

## 2016-03-15 DIAGNOSIS — S82832A Other fracture of upper and lower end of left fibula, initial encounter for closed fracture: Secondary | ICD-10-CM | POA: Diagnosis not present

## 2016-03-23 DIAGNOSIS — R5383 Other fatigue: Secondary | ICD-10-CM | POA: Diagnosis not present

## 2016-03-24 ENCOUNTER — Encounter: Payer: Self-pay | Admitting: Pulmonary Disease

## 2016-03-24 ENCOUNTER — Ambulatory Visit (HOSPITAL_COMMUNITY)
Admission: RE | Admit: 2016-03-24 | Discharge: 2016-03-24 | Disposition: A | Discharge status: Home / Self Care | Payer: Commercial Managed Care - HMO | Source: Ambulatory Visit | Attending: Pulmonary Disease | Admitting: Pulmonary Disease

## 2016-03-24 ENCOUNTER — Ambulatory Visit: Payer: Commercial Managed Care - HMO | Admitting: Pulmonary Disease

## 2016-03-24 ENCOUNTER — Ambulatory Visit (INDEPENDENT_AMBULATORY_CARE_PROVIDER_SITE_OTHER): Payer: Commercial Managed Care - HMO | Admitting: Pulmonary Disease

## 2016-03-24 VITALS — BP 132/74 | HR 73 | Ht 59.0 in | Wt 139.6 lb

## 2016-03-24 DIAGNOSIS — R0602 Shortness of breath: Secondary | ICD-10-CM | POA: Diagnosis not present

## 2016-03-24 DIAGNOSIS — J4520 Mild intermittent asthma, uncomplicated: Secondary | ICD-10-CM | POA: Diagnosis not present

## 2016-03-24 DIAGNOSIS — Z23 Encounter for immunization: Secondary | ICD-10-CM

## 2016-03-24 DIAGNOSIS — R05 Cough: Secondary | ICD-10-CM

## 2016-03-24 DIAGNOSIS — R058 Other specified cough: Secondary | ICD-10-CM

## 2016-03-24 LAB — PULMONARY FUNCTION TEST
DL/VA % pred: 102 %
DL/VA: 4.19 ml/min/mmHg/L
DLCO unc % pred: 61 %
DLCO unc: 10.75 ml/min/mmHg
FEF 25-75 Post: 2.07 L/sec
FEF 25-75 Pre: 2.23 L/sec
FEF2575-%CHANGE-POST: -7 %
FEF2575-%PRED-POST: 137 %
FEF2575-%Pred-Pre: 148 %
FEV1-%CHANGE-POST: -3 %
FEV1-%PRED-PRE: 102 %
FEV1-%Pred-Post: 98 %
FEV1-POST: 1.71 L
FEV1-PRE: 1.77 L
FEV1FVC-%Change-Post: 2 %
FEV1FVC-%PRED-PRE: 115 %
FEV6-%Change-Post: -5 %
FEV6-%Pred-Post: 87 %
FEV6-%Pred-Pre: 92 %
FEV6-Post: 1.92 L
FEV6-Pre: 2.03 L
FEV6FVC-%Pred-Post: 105 %
FEV6FVC-%Pred-Pre: 105 %
FVC-%CHANGE-POST: -5 %
FVC-%PRED-POST: 82 %
FVC-%PRED-PRE: 87 %
FVC-POST: 1.92 L
FVC-PRE: 2.03 L
POST FEV1/FVC RATIO: 89 %
PRE FEV6/FVC RATIO: 100 %
Post FEV6/FVC ratio: 100 %
Pre FEV1/FVC ratio: 87 %
RV % PRED: 62 %
RV: 1.25 L
TLC % pred: 75 %
TLC: 3.24 L

## 2016-03-24 LAB — NITRIC OXIDE: Nitric Oxide: 14

## 2016-03-24 MED ORDER — OMEPRAZOLE 40 MG PO CPDR: 40.0000 mg | DELAYED_RELEASE_CAPSULE | Freq: Two times a day (BID) | ORAL | 5 refills | Status: DC

## 2016-03-24 MED ORDER — ALBUTEROL SULFATE (2.5 MG/3ML) 0.083% IN NEBU
2.5000 mg | INHALATION_SOLUTION | Freq: Once | RESPIRATORY_TRACT | Status: AC
Start: 2016-03-24 — End: 2016-03-24
  Administered 2016-03-24: 2.5 mg via RESPIRATORY_TRACT

## 2016-03-24 NOTE — Patient Instructions (Signed)
Continue the chlorpheniramine and dymista. Will increase the Prilosec dose to 40 mg twice daily. Continue using the albuterol inhaler as needed.  Return to clinic in 6 months.

## 2016-03-24 NOTE — Progress Notes (Signed)
Brenda Bautista    [id number]    11/10/1945  Primary Care Physician:KOIRALA,DIBAS, MD  Referring Physician: Lujean Amel, MD [address], [place] [address]  Chief complaint:  Follow up for  Upper airway cough syndrome Post nasal drip GERD Mild intermittent asthma  HPI: Brenda Bautista is a 70 year old with past medical history of asthma.  She has a chief complaint is dyspnea on rest and exertion associated with occasional wheeze. She has daily cough, nonproductive in nature. She has significant issues with acid reflux and is currently on Prilosec 40 a day. She was diagnosed with asthma many years ago and is currently on an albuterol rescue inhaler which she just uses several times daily. She has seasonal allergies which is worse in spring and fall and eports sensitivity to tree pollen, dust. She does not have any pets at home, no mold issues.  She has been evaluated by cardiology for atypical chest pain. She is also noted to have mildly elevated ANA and has been evaluated by rheumatology. There is no evidence of ILD on previous imaging of her history.  Interim history: She has been started on oral antihistamine and dymista nasal spray with improvement in symptoms.   Outpatient Encounter Prescriptions as of 03/24/2016  Medication Sig  . albuterol (PROVENTIL HFA;VENTOLIN HFA) 108 (90 Base) MCG/ACT inhaler Inhale 2 puffs into the lungs every 6 (six) hours as needed for wheezing or shortness of breath.  . ALPRAZolam (XANAX) 0.25 MG tablet Take 0.25 mg by mouth 2 (two) times daily as needed for anxiety.  Marland Kitchen amLODipine (NORVASC) 2.5 MG tablet Take 2.5 mg by mouth daily.  Marland Kitchen aspirin 81 MG tablet Take 81 mg by mouth daily.  Marland Kitchen atorvastatin (LIPITOR) 40 MG tablet Take 1 tablet (40 mg total) by mouth daily.  . calcium carbonate 1250 MG capsule Take 1,250 mg by mouth daily.  . carboxymethylcellulose (REFRESH PLUS) 0.5 % SOLN Place 1 drop into both eyes  at bedtime.  . cholecalciferol (VITAMIN D) 1000 units tablet Take 1,000 Units by mouth daily.  . diphenhydrAMINE (BENADRYL) 25 MG tablet Take 25 mg by mouth every 6 (six) hours as needed.  Marland Kitchen FLUoxetine (PROZAC) 40 MG capsule Take 40 mg by mouth daily.  . meclizine (ANTIVERT) 25 MG tablet Take 25 mg by mouth 3 (three) times daily as needed for dizziness.  . Multiple Vitamins-Minerals (MULTIVITAMIN WITH MINERALS) tablet Take 1 tablet by mouth daily.  Marland Kitchen omeprazole (PRILOSEC) 40 MG capsule Take 40 mg by mouth daily.  . Azelastine-Fluticasone (DYMISTA) 137-50 MCG/ACT SUSP Place 1 spray into the nose 2 (two) times daily.   No facility-administered encounter medications on file as of 03/24/2016.     Allergies as of 03/24/2016 - Review Complete 03/24/2016  Allergen Reaction Noted  . Codeine  12/18/2015  . Bactrim [sulfamethoxazole-trimethoprim] Rash 12/18/2015  . Flagyl [metronidazole] Rash 12/18/2015  . Meloxicam Other (See Comments) 12/18/2015    Past Medical History:  Diagnosis Date  . Allergic rhinitis   . Anxiety   . Arthritis   . Asthma   . Cataract   . Depression   . Hyperlipemia   . Hypertension   . Sinusitis   . Vitamin D deficiency     Past Surgical History:  Procedure Laterality Date  . CARDIAC CATHETERIZATION N/A 12/19/2015   Procedure: Left Heart Cath and Coronary Angiography;  Surgeon: Leonie Man, MD;  Location: [facility];  Service: Cardiovascular;  Laterality: N/A;  . CATARACT EXTRACTION, BILATERAL Right 06/2015  . CATARACT EXTRACTION, BILATERAL Left 07/2015    Family History  Problem Relation Age of Onset  . Coronary artery disease Mother 23    MI in early 76s  . Sick sinus syndrome Mother     required PPM  . Emphysema Father   . Asthma Father   . Heart attack Father     Social History   Social History  . Marital status: Widowed    Spouse name: N/A  . Number of children: N/A  . Years of education: N/A   Occupational History  . Not on  file.   Social History Main Topics  . Smoking status: Never Smoker  . Smokeless tobacco: Never Used  . Alcohol use No  . Drug use: No  . Sexual activity: Not on file   Other Topics Concern  . Not on file   Social History Narrative   Widowed, spouse Delfino Lovett passed away 09-17-14 (he was a former patient of Dr Elsworth Soho and Rexene Edison NP)   5 children, all live in [place]: retired, worked in a [employer]: Review of Systems  Constitutional: Negative for fever and chills.  HENT: Negative.   Eyes: Negative for blurred vision.  Respiratory: as per HPI  Cardiovascular: Negative for chest pain and palpitations.  Gastrointestinal: Negative for vomiting, diarrhea, blood per rectum. Genitourinary: Negative for dysuria, urgency, frequency and hematuria.  Musculoskeletal: Negative for myalgias, back pain and joint pain.  Skin: Negative for itching and rash.  Neurological: Negative for dizziness, tremors, focal weakness, seizures and loss of consciousness.  Endo/Heme/Allergies: Negative for environmental allergies.  Psychiatric/Behavioral: Negative for depression, suicidal ideas and hallucinations.  All other systems reviewed and are negative.   Physical Exam: Blood pressure 122/74, pulse (!) 58, height 4\' 11"  (1.499 m), weight 138 lb 9.6 oz (62.9 kg), SpO2 99 %. Gen:      No acute distress HEENT:  EOMI, sclera anicteric Neck:     No masses; no thyromegaly Lungs:    Clear to auscultation bilaterally; normal respiratory effort CV:         Regular rate and rhythm; no murmurs Abd:      + bowel sounds; soft, non-tender; no palpable masses, no distension Ext:    No edema; adequate peripheral perfusion Skin:      Warm and dry; no rash Neuro: alert and oriented x 3 Psych: normal mood and affect  Data Reviewed: Echo 12/19/15 Left ventricle: The cavity size was normal. Wall thickness was normal. Systolic function was normal. The estimated ejection fraction  was in the range of 60% to 65%. Wall motion was normal; there were no regional wall motion abnormalities. Doppler parameters are consistent with abnormal left ventricular relaxation (grade 1 diastolic dysfunction).  CXR 12/18/15 Lungs are clear, no acute cardiopulmonary abnormality. Images reviewed.  PFTs 03/24/16 FVC 1.9 to [2%) FEV1 1.7 (90%) F/F 89 TLC 75% DLCO 61%, corrected 102% Minimal restriction, moderate diffusion defect which corrects for alveolar volume  Assessment:  Mild intermittent asthma Cough, wheezing. She has history of asthma, allergies and symptoms may represent worsening of reactive airway disease.  I think her major issue is upper airway cough syndrome from rhinitis, postnasal drip and acid reflux. She has responded well to chlorpheniramine and dymista nasal spray. She reports worsening heartburn symptoms. I will increase the Prilosec to 40 mg bid.    Her FENO is  low in office today. She'll continue the albuterol inhaler and does not need to be on additional inhalers.   Her PFTs were reviewed today which did not show any airway obstruction. There is mild restriction and diffusion impairment however the diffusion impairment corrects for alveolar volume. I do not believe that she has interstitial lung disease.  Plan/Recommendations: - Continue Chlorpheniramine 8mg  tid, dymista nasal spray - Continue albuterol - Increase prilosec to 40 mg bid  Marshell Garfinkel MD [facility] Pager [phone] 03/24/2016, 12:04 PM  CC: Lujean Amel, MD

## 2016-03-26 ENCOUNTER — Telehealth: Payer: Self-pay | Admitting: Pulmonary Disease

## 2016-03-26 MED ORDER — AZELASTINE-FLUTICASONE 137-50 MCG/ACT NA SUSP: 1.0000 | Freq: Two times a day (BID) | NASAL | 5 refills | Status: DC

## 2016-03-26 NOTE — Telephone Encounter (Signed)
Attempted to call the pt, but her phone kept going out and I could not hear her.  I have sent the dymista to the pharmacy.  Nothing further is needed.

## 2016-03-30 ENCOUNTER — Telehealth: Payer: Self-pay | Admitting: Pulmonary Disease

## 2016-03-30 NOTE — Telephone Encounter (Signed)
Called and spoke with Threasa Beards from [facility] and she stated that the dymista is not covered by the pts insurance company.  PM please advise if we can change to something else. Thanks.   Allergies  Allergen Reactions  . Codeine   . Bactrim [Sulfamethoxazole-Trimethoprim] Rash  . Flagyl [Metronidazole] Rash  . Meloxicam Other (See Comments)    Abdominal pain

## 2016-03-31 MED ORDER — FLUTICASONE PROPIONATE 50 MCG/ACT NA SUSP: 2.0000 | Freq: Every day | NASAL | 2 refills | Status: DC

## 2016-03-31 MED ORDER — AZELASTINE HCL 0.1 % NA SOLN: 2.0000 | Freq: Two times a day (BID) | NASAL | 12 refills | Status: DC

## 2016-03-31 NOTE — Telephone Encounter (Signed)
Try 2 separate inhaler. Flonase and astelin bid

## 2016-03-31 NOTE — Telephone Encounter (Signed)
New rx for the flonase and astelin have been sent to the pharmacy. Nothing further is needed.

## 2016-04-05 DIAGNOSIS — S82832D Other fracture of upper and lower end of left fibula, subsequent encounter for closed fracture with routine healing: Secondary | ICD-10-CM | POA: Diagnosis not present

## 2016-04-14 ENCOUNTER — Other Ambulatory Visit: Payer: Self-pay | Admitting: Pulmonary Disease

## 2016-04-20 MED ORDER — FLUTICASONE PROPIONATE 50 MCG/ACT NA SUSP: 2.0000 | Freq: Every day | NASAL | 1 refills | Status: DC

## 2016-04-20 NOTE — Telephone Encounter (Signed)
Received faxed refill request from [facility] [address] for 90 day supply of Flonase Last ov 10.18.17 w/ PM Refills sent Will sign off

## 2016-05-18 DIAGNOSIS — H524 Presbyopia: Secondary | ICD-10-CM | POA: Diagnosis not present

## 2016-07-20 DIAGNOSIS — J069 Acute upper respiratory infection, unspecified: Secondary | ICD-10-CM | POA: Diagnosis not present

## 2016-07-26 DIAGNOSIS — J069 Acute upper respiratory infection, unspecified: Secondary | ICD-10-CM | POA: Diagnosis not present

## 2016-08-02 ENCOUNTER — Other Ambulatory Visit: Payer: Self-pay

## 2016-08-02 MED ORDER — OMEPRAZOLE 40 MG PO CPDR: 40.0000 mg | DELAYED_RELEASE_CAPSULE | Freq: Two times a day (BID) | ORAL | 5 refills | Status: DC

## 2016-10-20 DIAGNOSIS — R112 Nausea with vomiting, unspecified: Secondary | ICD-10-CM | POA: Diagnosis not present

## 2016-10-20 DIAGNOSIS — R229 Localized swelling, mass and lump, unspecified: Secondary | ICD-10-CM | POA: Diagnosis not present

## 2016-10-20 DIAGNOSIS — R109 Unspecified abdominal pain: Secondary | ICD-10-CM | POA: Diagnosis not present

## 2016-10-20 DIAGNOSIS — R197 Diarrhea, unspecified: Secondary | ICD-10-CM | POA: Diagnosis not present

## 2016-10-21 ENCOUNTER — Other Ambulatory Visit: Payer: Self-pay | Admitting: Family Medicine

## 2016-10-21 DIAGNOSIS — R1084 Generalized abdominal pain: Secondary | ICD-10-CM

## 2016-10-28 ENCOUNTER — Ambulatory Visit
Admission: RE | Admit: 2016-10-28 | Discharge: 2016-10-28 | Disposition: A | Discharge status: Home / Self Care | Payer: Commercial Managed Care - HMO | Source: Ambulatory Visit | Attending: Family Medicine | Admitting: Family Medicine

## 2016-10-28 DIAGNOSIS — R1084 Generalized abdominal pain: Secondary | ICD-10-CM

## 2016-10-28 DIAGNOSIS — R1011 Right upper quadrant pain: Secondary | ICD-10-CM | POA: Diagnosis not present

## 2016-11-11 DIAGNOSIS — M810 Age-related osteoporosis without current pathological fracture: Secondary | ICD-10-CM | POA: Diagnosis not present

## 2016-12-15 ENCOUNTER — Other Ambulatory Visit: Payer: Self-pay | Admitting: Physician Assistant

## 2016-12-15 NOTE — Telephone Encounter (Signed)
Refill Request.  

## 2017-01-24 ENCOUNTER — Other Ambulatory Visit: Payer: Self-pay | Admitting: Family Medicine

## 2017-01-24 DIAGNOSIS — F321 Major depressive disorder, single episode, moderate: Secondary | ICD-10-CM | POA: Diagnosis not present

## 2017-01-24 DIAGNOSIS — Z79899 Other long term (current) drug therapy: Secondary | ICD-10-CM | POA: Diagnosis not present

## 2017-01-24 DIAGNOSIS — Z23 Encounter for immunization: Secondary | ICD-10-CM | POA: Diagnosis not present

## 2017-01-24 DIAGNOSIS — Z0001 Encounter for general adult medical examination with abnormal findings: Secondary | ICD-10-CM | POA: Diagnosis not present

## 2017-01-24 DIAGNOSIS — M810 Age-related osteoporosis without current pathological fracture: Secondary | ICD-10-CM | POA: Diagnosis not present

## 2017-01-24 DIAGNOSIS — Z1231 Encounter for screening mammogram for malignant neoplasm of breast: Secondary | ICD-10-CM

## 2017-01-24 DIAGNOSIS — D175 Benign lipomatous neoplasm of intra-abdominal organs: Secondary | ICD-10-CM | POA: Diagnosis not present

## 2017-01-24 DIAGNOSIS — Z136 Encounter for screening for cardiovascular disorders: Secondary | ICD-10-CM | POA: Diagnosis not present

## 2017-01-24 DIAGNOSIS — I10 Essential (primary) hypertension: Secondary | ICD-10-CM | POA: Diagnosis not present

## 2017-01-24 DIAGNOSIS — E559 Vitamin D deficiency, unspecified: Secondary | ICD-10-CM | POA: Diagnosis not present

## 2017-01-24 DIAGNOSIS — F419 Anxiety disorder, unspecified: Secondary | ICD-10-CM | POA: Diagnosis not present

## 2017-01-24 DIAGNOSIS — E7800 Pure hypercholesterolemia, unspecified: Secondary | ICD-10-CM | POA: Diagnosis not present

## 2017-02-02 ENCOUNTER — Ambulatory Visit
Admission: RE | Admit: 2017-02-02 | Discharge: 2017-02-02 | Disposition: A | Discharge status: Home / Self Care | Payer: PPO | Source: Ambulatory Visit | Attending: Family Medicine | Admitting: Family Medicine

## 2017-02-02 ENCOUNTER — Other Ambulatory Visit: Payer: Self-pay | Admitting: Pulmonary Disease

## 2017-02-02 DIAGNOSIS — Z1231 Encounter for screening mammogram for malignant neoplasm of breast: Secondary | ICD-10-CM

## 2017-02-17 ENCOUNTER — Other Ambulatory Visit: Payer: Self-pay | Admitting: Pulmonary Disease

## 2017-03-16 ENCOUNTER — Other Ambulatory Visit: Payer: Self-pay | Admitting: Pulmonary Disease

## 2017-03-17 ENCOUNTER — Other Ambulatory Visit: Payer: Self-pay | Admitting: Pulmonary Disease

## 2017-03-21 ENCOUNTER — Ambulatory Visit (INDEPENDENT_AMBULATORY_CARE_PROVIDER_SITE_OTHER): Payer: PPO | Admitting: Pulmonary Disease

## 2017-03-21 ENCOUNTER — Encounter: Payer: Self-pay | Admitting: Pulmonary Disease

## 2017-03-21 VITALS — BP 130/72 | HR 60 | Ht 59.0 in | Wt 135.8 lb

## 2017-03-21 DIAGNOSIS — R0602 Shortness of breath: Secondary | ICD-10-CM

## 2017-03-21 DIAGNOSIS — R058 Other specified cough: Secondary | ICD-10-CM

## 2017-03-21 DIAGNOSIS — R05 Cough: Secondary | ICD-10-CM | POA: Diagnosis not present

## 2017-03-21 MED ORDER — BUDESONIDE-FORMOTEROL FUMARATE 80-4.5 MCG/ACT IN AERO: 2.0000 | INHALATION_SPRAY | Freq: Two times a day (BID) | RESPIRATORY_TRACT | 5 refills | Status: DC

## 2017-03-21 MED ORDER — BUDESONIDE-FORMOTEROL FUMARATE 80-4.5 MCG/ACT IN AERO: 2.0000 | INHALATION_SPRAY | Freq: Two times a day (BID) | RESPIRATORY_TRACT | 0 refills | Status: DC

## 2017-03-21 NOTE — Patient Instructions (Signed)
We will give you a sample of Symbicort 80/4.5. We will also call in a prescription for this. If you feels Symbicort is helping then please refill the prescription Continue using your allergy medication and nasal spray as directed Follow-up in 3 months.

## 2017-03-21 NOTE — Progress Notes (Signed)
Brenda Bautista    [id number]    June 05, 1946  Primary Care Physician:Koirala, Dibas, MD  Referring Physician: Lujean Amel, MD [address] [place], [place] [address]  Chief complaint:  Follow up for  Upper airway cough syndrome Post nasal drip GERD Mild intermittent asthma  HPI: Brenda Bautista is a 71 year old with past medical history of asthma.  She has a chief complaint is dyspnea on rest and exertion associated with occasional wheeze. She has daily cough, nonproductive in nature. She has significant issues with acid reflux and is currently on Prilosec 40 a day. She was diagnosed with asthma many years ago and is currently on an albuterol rescue inhaler which she just uses several times daily. She has seasonal allergies which is worse in spring and fall and eports sensitivity to tree pollen, dust. She does not have any pets at home, no mold issues.  She has been evaluated by cardiology for atypical chest pain. She is also noted to have mildly elevated ANA and has been evaluated by rheumatology. There is no evidence of ILD on previous imaging of her history.  Interim history: She continues on antihistamine and dymista nasal spray. She is using the albuterol several times a day due to increasing dyspnea with wheezing. She occasionally loses her voice, does not report any nighttime awakenings. She sings in a church choir and notices shortness of breath with wheezing at times while singing.  Outpatient Encounter Prescriptions as of 03/21/2017  Medication Sig  . albuterol (PROVENTIL HFA;VENTOLIN HFA) 108 (90 Base) MCG/ACT inhaler Inhale 2 puffs into the lungs every 6 (six) hours as needed for wheezing or shortness of breath.  . ALPRAZolam (XANAX) 0.25 MG tablet Take 0.25 mg by mouth 2 (two) times daily as needed for anxiety.  Marland Kitchen amLODipine (NORVASC) 2.5 MG tablet Take 2.5 mg by mouth daily.  Marland Kitchen aspirin 81 MG tablet Take 81 mg by mouth daily.  Marland Kitchen  atorvastatin (LIPITOR) 40 MG tablet Take 1 tablet (40 mg total) by mouth daily. Needs office visit  . azelastine (ASTELIN) 0.1 % nasal spray PLACE 2 SPRAYS INTO BOTH NOSTRILS 2 (TWO) TIMES DAILY. AS DIRECTED BY DOCTOR  . calcium carbonate 1250 MG capsule Take 1,250 mg by mouth daily.  . carboxymethylcellulose (REFRESH PLUS) 0.5 % SOLN Place 1 drop into both eyes at bedtime.  . cholecalciferol (VITAMIN D) 1000 units tablet Take 1,000 Units by mouth daily.  . diphenhydrAMINE (BENADRYL) 25 MG tablet Take 25 mg by mouth every 6 (six) hours as needed.  Marland Kitchen FLUoxetine (PROZAC) 40 MG capsule Take 40 mg by mouth daily.  . fluticasone (FLONASE) 50 MCG/ACT nasal spray PLACE 2 SPRAYS INTO BOTH NOSTRILS DAILY.  . meclizine (ANTIVERT) 25 MG tablet Take 25 mg by mouth 3 (three) times daily as needed for dizziness.  . Multiple Vitamins-Minerals (MULTIVITAMIN WITH MINERALS) tablet Take 1 tablet by mouth daily.  Marland Kitchen omeprazole (PRILOSEC) 40 MG capsule TAKE 1 CAPSULE (40 MG TOTAL) BY MOUTH 2 (TWO) TIMES DAILY.   No facility-administered encounter medications on file as of 03/21/2017.     Allergies as of 03/21/2017 - Review Complete 03/21/2017  Allergen Reaction Noted  . Codeine  12/18/2015  . Bactrim [sulfamethoxazole-trimethoprim] Rash 12/18/2015  . Flagyl [metronidazole] Rash 12/18/2015  . Meloxicam Other (See Comments) 12/18/2015    Past Medical History:  Diagnosis Date  . Allergic rhinitis   . Anxiety   . Arthritis   . Asthma   .  Cataract   . Depression   . Hyperlipemia   . Hypertension   . Sinusitis   . Vitamin D deficiency     Past Surgical History:  Procedure Laterality Date  . CARDIAC CATHETERIZATION N/A 12/19/2015   Procedure: Left Heart Cath and Coronary Angiography;  Surgeon: Leonie Man, MD;  Location: [facility];  Service: Cardiovascular;  Laterality: N/A;  . CATARACT EXTRACTION, BILATERAL Right 06/2015  . CATARACT EXTRACTION, BILATERAL Left 07/2015    Family History    Problem Relation Age of Onset  . Coronary artery disease Mother 48       MI in early 71s  . Sick sinus syndrome Mother        required PPM  . Emphysema Father   . Asthma Father   . Heart attack Father     Social History   Social History  . Marital status: Widowed    Spouse name: N/A  . Number of children: N/A  . Years of education: N/A   Occupational History  . Not on file.   Social History Main Topics  . Smoking status: Never Smoker  . Smokeless tobacco: Never Used  . Alcohol use No  . Drug use: No  . Sexual activity: Not on file   Other Topics Concern  . Not on file   Social History Narrative   Widowed, spouse Delfino Lovett passed away 2014/09/13 (he was a former patient of Dr Elsworth Soho and Rexene Edison NP)   5 children, all live in [place]: retired, worked in a [employer]: Review of Systems  Constitutional: Negative for fever and chills.  HENT: Negative.   Eyes: Negative for blurred vision.  Respiratory: as per HPI  Cardiovascular: Negative for chest pain and palpitations.  Gastrointestinal: Negative for vomiting, diarrhea, blood per rectum. Genitourinary: Negative for dysuria, urgency, frequency and hematuria.  Musculoskeletal: Negative for myalgias, back pain and joint pain.  Skin: Negative for itching and rash.  Neurological: Negative for dizziness, tremors, focal weakness, seizures and loss of consciousness.  Endo/Heme/Allergies: Negative for environmental allergies.  Psychiatric/Behavioral: Negative for depression, suicidal ideas and hallucinations.  All other systems reviewed and are negative.   Physical Exam: Blood pressure 122/74, pulse (!) 58, height 4\' 11"  (1.499 m), weight 138 lb 9.6 oz (62.9 kg), SpO2 99 %. Gen:      No acute distress HEENT:  EOMI, sclera anicteric Neck:     No masses; no thyromegaly Lungs:    Clear to auscultation bilaterally; normal respiratory effort CV:         Regular rate and rhythm; no  murmurs Abd:      + bowel sounds; soft, non-tender; no palpable masses, no distension Ext:    No edema; adequate peripheral perfusion Skin:      Warm and dry; no rash Neuro: alert and oriented x 3 Psych: normal mood and affect  Data Reviewed: Echo 12/19/15 Left ventricle: The cavity size was normal. Wall thickness was normal. Systolic function was normal. The estimated ejection fraction was in the range of 60% to 65%. Wall motion was normal; there were no regional wall motion abnormalities. Doppler parameters are consistent with abnormal left ventricular relaxation (grade 1 diastolic dysfunction).  CXR 12/18/15 Lungs are clear, no acute cardiopulmonary abnormality. Images reviewed.  PFTs 03/24/16 FVC 1.92 [82%), FEV1 1.7 (90%), F/F 89, TLC 75%, DLCO 61%, corrected 102% Minimal restriction, moderate diffusion defect which corrects for alveolar volume  Assessment:  Cough, wheezing. She has history of asthma, allergies and symptoms may represent worsening of reactive airway disease.  I think her major issue is upper airway cough syndrome from rhinitis, postnasal drip and acid reflux. She has responded well to chlorpheniramine and dymista nasal spray. She reports worsening heartburn symptoms. I will increase the Prilosec to 40 mg bid.    Mild intermittent asthma Symptoms appear to be worsening. She is using albuterol several times a day and notices increasing dyspnea with wheezing but FENO is still low. She may benefit from a controller medication. We will trial her on symbicort 80/4.5 Follow up in 3 months.  Her PFTs do not show any airway obstruction. There is mild restriction and diffusion impairment however the diffusion impairment corrects for alveolar volume. I do not believe that she has interstitial lung disease.  Plan/Recommendations: - Continue Chlorpheniramine 8mg  tid, dymista nasal spray, Prilosec - Continue albuterol. Trial Symbicort 80/4.5  Marshell Garfinkel MD [facility] Pager [phone] 03/21/2017, 10:37 AM  CC: Lujean Amel, MD

## 2017-04-14 DIAGNOSIS — J209 Acute bronchitis, unspecified: Secondary | ICD-10-CM | POA: Diagnosis not present

## 2017-04-14 DIAGNOSIS — J329 Chronic sinusitis, unspecified: Secondary | ICD-10-CM | POA: Diagnosis not present

## 2017-04-18 DIAGNOSIS — B0239 Other herpes zoster eye disease: Secondary | ICD-10-CM | POA: Diagnosis not present

## 2017-04-19 DIAGNOSIS — B0239 Other herpes zoster eye disease: Secondary | ICD-10-CM | POA: Diagnosis not present

## 2017-05-02 DIAGNOSIS — E7800 Pure hypercholesterolemia, unspecified: Secondary | ICD-10-CM | POA: Diagnosis not present

## 2017-05-02 DIAGNOSIS — M810 Age-related osteoporosis without current pathological fracture: Secondary | ICD-10-CM | POA: Diagnosis not present

## 2017-05-02 DIAGNOSIS — F321 Major depressive disorder, single episode, moderate: Secondary | ICD-10-CM | POA: Diagnosis not present

## 2017-05-02 DIAGNOSIS — I10 Essential (primary) hypertension: Secondary | ICD-10-CM | POA: Diagnosis not present

## 2017-05-02 DIAGNOSIS — F419 Anxiety disorder, unspecified: Secondary | ICD-10-CM | POA: Diagnosis not present

## 2017-05-09 ENCOUNTER — Other Ambulatory Visit: Payer: Self-pay

## 2017-05-09 MED ORDER — OMEPRAZOLE 40 MG PO CPDR: 40.0000 mg | DELAYED_RELEASE_CAPSULE | Freq: Two times a day (BID) | ORAL | 2 refills | Status: DC

## 2017-05-12 DIAGNOSIS — J329 Chronic sinusitis, unspecified: Secondary | ICD-10-CM | POA: Diagnosis not present

## 2017-05-12 DIAGNOSIS — R05 Cough: Secondary | ICD-10-CM | POA: Diagnosis not present

## 2017-05-20 ENCOUNTER — Other Ambulatory Visit: Payer: Self-pay

## 2017-05-20 MED ORDER — AZELASTINE HCL 0.1 % NA SOLN: 2.0000 | Freq: Two times a day (BID) | NASAL | 12 refills | Status: DC

## 2017-05-20 NOTE — Telephone Encounter (Signed)
Received Rx request for Azelastine from [facility] [address]. Rx has been sent to preferred pharmacy. Nothing further is needed.

## 2017-06-20 ENCOUNTER — Other Ambulatory Visit (INDEPENDENT_AMBULATORY_CARE_PROVIDER_SITE_OTHER): Payer: PPO

## 2017-06-20 ENCOUNTER — Ambulatory Visit (INDEPENDENT_AMBULATORY_CARE_PROVIDER_SITE_OTHER)
Admission: RE | Admit: 2017-06-20 | Discharge: 2017-06-20 | Disposition: A | Discharge status: Home / Self Care | Payer: PPO | Source: Ambulatory Visit | Attending: Pulmonary Disease | Admitting: Pulmonary Disease

## 2017-06-20 ENCOUNTER — Ambulatory Visit: Payer: PPO | Admitting: Pulmonary Disease

## 2017-06-20 ENCOUNTER — Encounter: Payer: Self-pay | Admitting: Pulmonary Disease

## 2017-06-20 VITALS — BP 160/80 | HR 66 | Ht 59.0 in | Wt 131.8 lb

## 2017-06-20 DIAGNOSIS — J329 Chronic sinusitis, unspecified: Secondary | ICD-10-CM | POA: Diagnosis not present

## 2017-06-20 DIAGNOSIS — J45909 Unspecified asthma, uncomplicated: Secondary | ICD-10-CM

## 2017-06-20 DIAGNOSIS — R0602 Shortness of breath: Secondary | ICD-10-CM

## 2017-06-20 DIAGNOSIS — R911 Solitary pulmonary nodule: Secondary | ICD-10-CM | POA: Diagnosis not present

## 2017-06-20 LAB — CBC WITH DIFFERENTIAL/PLATELET
BASOS ABS: 0.1 10*3/uL (ref 0.0–0.1)
Basophils Relative: 1.2 % (ref 0.0–3.0)
EOS ABS: 0.1 10*3/uL (ref 0.0–0.7)
Eosinophils Relative: 0.9 % (ref 0.0–5.0)
HEMATOCRIT: 37.4 % (ref 36.0–46.0)
HEMOGLOBIN: 12.5 g/dL (ref 12.0–15.0)
LYMPHS PCT: 21 % (ref 12.0–46.0)
Lymphs Abs: 2 10*3/uL (ref 0.7–4.0)
MCHC: 33.4 g/dL (ref 30.0–36.0)
MCV: 93.7 fl (ref 78.0–100.0)
MONOS PCT: 8.3 % (ref 3.0–12.0)
Monocytes Absolute: 0.8 10*3/uL (ref 0.1–1.0)
Neutro Abs: 6.5 10*3/uL (ref 1.4–7.7)
Neutrophils Relative %: 68.6 % (ref 43.0–77.0)
Platelets: 316 10*3/uL (ref 150.0–400.0)
RBC: 4 Mil/uL (ref 3.87–5.11)
RDW: 15 % (ref 11.5–15.5)
WBC: 9.4 10*3/uL (ref 4.0–10.5)

## 2017-06-20 NOTE — Patient Instructions (Signed)
We will check a CBC differential, blood allergy profile, ANA with reflex, CCP, rheumatoid factor, quantitative immunoglobulins Schedule high-resolution CT chest Referred to ENT for evaluation of recurrent sinusitis We will continue the Symbicort as prescribed Follow-up in 1-2 months.

## 2017-06-20 NOTE — Progress Notes (Signed)
Brenda Bautista    [id number]    [birth year]  Primary Care Physician:Koirala, Dibas, MD  Referring Physician: Lujean Amel, MD [address], [place] [address]  Chief complaint:  Follow up for  Upper airway cough syndrome Post nasal drip GERD Mild intermittent asthma  HPI: Brenda Bautista is a 72 year old with past medical history of asthma.  She has a chief complaint is dyspnea on rest and exertion associated with occasional wheeze. She has daily cough, nonproductive in nature. She has significant issues with acid reflux and is currently on Prilosec 40 a day. She was diagnosed with asthma many years ago. She has seasonal allergies which is worse in spring and fall and reports sensitivity to tree pollen, dust. She does not have any pets at home, no mold issues.  She has been evaluated by cardiology for atypical chest pain. She is also noted to have mildly elevated ANA and has been evaluated by rheumatology. There is no evidence of ILD on previous imaging.  Interim history: She has had recurrent episodes of sinusitis, upper respiratory tract infection requiring antibiotics and prednisone.  Symptoms are mostly confined to upper airway she does not significant symptoms of cough, sputum production, dyspnea or wheezing.. She sings in a church choir and notices hoarseness, shortness of breath with wheezing at times while singing.  Outpatient Encounter Medications as of 06/20/2017  Medication Sig  . albuterol (PROVENTIL HFA;VENTOLIN HFA) 108 (90 Base) MCG/ACT inhaler Inhale 2 puffs into the lungs every 6 (six) hours as needed for wheezing or shortness of breath.  . ALPRAZolam (XANAX) 0.25 MG tablet Take 0.25 mg by mouth 2 (two) times daily as needed for anxiety.  Marland Kitchen amLODipine (NORVASC) 2.5 MG tablet Take 2.5 mg by mouth daily.  Marland Kitchen aspirin 81 MG tablet Take 81 mg by mouth daily.  Marland Kitchen atorvastatin (LIPITOR) 40 MG tablet Take 1 tablet (40 mg total) by mouth  daily. Needs office visit  . azelastine (ASTELIN) 0.1 % nasal spray Place 2 sprays into both nostrils 2 (two) times daily. Use in each nostril as directed  . budesonide-formoterol (SYMBICORT) 80-4.5 MCG/ACT inhaler Inhale 2 puffs into the lungs 2 (two) times daily.  . calcium carbonate 1250 MG capsule Take 1,250 mg by mouth daily.  . carboxymethylcellulose (REFRESH PLUS) 0.5 % SOLN Place 1 drop into both eyes at bedtime.  . cholecalciferol (VITAMIN D) 1000 units tablet Take 1,000 Units by mouth daily.  . diphenhydrAMINE (BENADRYL) 25 MG tablet Take 25 mg by mouth every 6 (six) hours as needed.  Marland Kitchen FLUoxetine (PROZAC) 40 MG capsule Take 40 mg by mouth daily.  . fluticasone (FLONASE) 50 MCG/ACT nasal spray PLACE 2 SPRAYS INTO BOTH NOSTRILS DAILY.  . meclizine (ANTIVERT) 25 MG tablet Take 25 mg by mouth 3 (three) times daily as needed for dizziness.  . Multiple Vitamins-Minerals (MULTIVITAMIN WITH MINERALS) tablet Take 1 tablet by mouth daily.  Marland Kitchen omeprazole (PRILOSEC) 40 MG capsule Take 1 capsule (40 mg total) by mouth 2 (two) times daily.  . Probiotic Product (PROBIOTIC ADVANCED PO) Take 1 capsule by mouth daily.   No facility-administered encounter medications on file as of 06/20/2017.     Allergies as of 06/20/2017 - Review Complete 06/20/2017  Allergen Reaction Noted  . Codeine  12/18/2015  . Bactrim [sulfamethoxazole-trimethoprim] Rash 12/18/2015  . Flagyl [metronidazole] Rash 12/18/2015  . Meloxicam Other (See Comments) 12/18/2015    Past Medical History:  Diagnosis Date  . Allergic  rhinitis   . Anxiety   . Arthritis   . Asthma   . Cataract   . Depression   . Hyperlipemia   . Hypertension   . Sinusitis   . Vitamin D deficiency     Past Surgical History:  Procedure Laterality Date  . CARDIAC CATHETERIZATION N/A 12/19/2015   Procedure: Left Heart Cath and Coronary Angiography;  Surgeon: Leonie Man, MD;  Location: [facility];  Service: Cardiovascular;  Laterality:  N/A;  . CATARACT EXTRACTION, BILATERAL Right 06/2015  . CATARACT EXTRACTION, BILATERAL Left 07/2015    Family History  Problem Relation Age of Onset  . Coronary artery disease Mother 26       MI in early 29s  . Sick sinus syndrome Mother        required PPM  . Emphysema Father   . Asthma Father   . Heart attack Father     Social History   Socioeconomic History  . Marital status: Widowed    Spouse name: Not on file  . Number of children: Not on file  . Years of education: Not on file  . Highest education level: Not on file  Social Needs  . Financial resource strain: Not on file  . Food insecurity - worry: Not on file  . Food insecurity - inability: Not on file  . Transportation needs - medical: Not on file  . Transportation needs - non-medical: Not on file  Occupational History  . Not on file  Tobacco Use  . Smoking status: Never Smoker  . Smokeless tobacco: Never Used  Substance and Sexual Activity  . Alcohol use: No  . Drug use: No  . Sexual activity: Not on file  Other Topics Concern  . Not on file  Social History Narrative   Widowed, spouse Delfino Lovett passed away 2014-09-08 (he was a former patient of Dr Elsworth Soho and Rexene Edison NP)   5 children, all live in [place]: retired, worked in a [employer]: Review of Systems  Constitutional: Negative for fever and chills.  HENT: Negative.   Eyes: Negative for blurred vision.  Respiratory: as per HPI  Cardiovascular: Negative for chest pain and palpitations.  Gastrointestinal: Negative for vomiting, diarrhea, blood per rectum. Genitourinary: Negative for dysuria, urgency, frequency and hematuria.  Musculoskeletal: Negative for myalgias, back pain and joint pain.  Skin: Negative for itching and rash.  Neurological: Negative for dizziness, tremors, focal weakness, seizures and loss of consciousness.  Endo/Heme/Allergies: Negative for environmental allergies.  Psychiatric/Behavioral: Negative  for depression, suicidal ideas and hallucinations.  All other systems reviewed and are negative.  Physical Exam: Blood pressure (!) 160/80, pulse 66, height 4\' 11"  (1.499 m), weight 131 lb 12.8 oz (59.8 kg), SpO2 98 %. Gen:      No acute distress HEENT:  EOMI, sclera anicteric Neck:     No masses; no thyromegaly Lungs:    Clear to auscultation bilaterally; normal respiratory effort CV:         Regular rate and rhythm; no murmurs Abd:      + bowel sounds; soft, non-tender; no palpable masses, no distension Ext:    No edema; adequate peripheral perfusion Skin:      Warm and dry; no rash Neuro: alert and oriented x 3 Psych: normal mood and affect  Data Reviewed: Echo 12/19/15 Left ventricle: The cavity size was normal. Wall thickness was normal. Systolic function was normal. The estimated ejection fraction was  in the range of 60% to 65%. Wall motion was normal; there were no regional wall motion abnormalities. Doppler parameters are consistent with abnormal left ventricular relaxation (grade 1 diastolic dysfunction).  CXR 12/18/15 Lungs are clear, no acute cardiopulmonary abnormality. Images reviewed.  PFTs 03/24/16 FVC 1.92 [82%), FEV1 1.7 (90%), F/F 89, TLC 75%, DLCO 61%, corrected 102% Minimal restriction, moderate diffusion defect which corrects for alveolar volume  FENO 03/24/16-14  Assessment:  Upper airway cough She has had recurrent episodes of sinusitis with upper airway infection.  Continues on antihistamine, Flonase nasal spray and Prilosec twice daily.  Refer to ENT for further evaluation.  Consider sinus CT  Mild intermittent asthma Continue on Symbicort and albuterol.   She wants an evaluation for immunodeficiencies given her current infection.  Will check quantitative immunoglobulin Suspicion for interstitial lung disease is low, however given PFTs which showed minimal restriction in diffusion defect will get a high-resolution CT for complete evaluation. Previous  labs noted for mild elevation in ANA.  Will recheck serologies for connective tissue disease.  Plan/Recommendations: - Continue Symbicort, albuterol - CBC with differential, blood allergy profile, ANA, CCP, rheumatoid factor, quantitative immunoglobulin - High Resolution CT - ENT evaluation   Marshell Garfinkel MD [facility] Pager [phone] 06/20/2017, 12:12 PM  CC: Lujean Amel, MD

## 2017-06-21 LAB — RESPIRATORY ALLERGY PROFILE REGION II ~~LOC~~
Allergen, Comm Silver Birch, t9: <0.1 kU/L
Allergen, Cottonwood, t14: <0.1 kU/L
Allergen, D pternoyssinus,d7: <0.1 kU/L
Allergen, Mouse Urine Protein, e78: <0.1 kU/L
Allergen, Mulberry, t76: <0.1 kU/L
Allergen, Oak,t7: <0.1 kU/L
Allergen, P. notatum, m1: <0.1 kU/L
Aspergillus fumigatus, m3: <0.1 kU/L
Bermuda Grass: <0.1 kU/L
Box Elder IgE: <0.1 kU/L
CLASS: 0
CLASS: 0
CLASS: 0
CLASS: 0
CLASS: 0
CLASS: 0
CLASS: 0
CLASS: 0
CLASS: 0
CLASS: 0
CLASS: 0
Class: 0
Class: 0
Class: 0
Class: 0
Class: 0
Class: 0
Class: 0
Class: 0
Class: 0
Class: 0
Class: 0
Class: 0
Class: 0
Cockroach: <0.1 kU/L
D. farinae: <0.1 kU/L
Dog Dander: <0.1 kU/L
Elm IgE: <0.1 kU/L
IgE (Immunoglobulin E), Serum: <2 kU/L (ref <=114)
Johnson Grass: <0.1 kU/L
Pecan/Hickory Tree IgE: <0.1 kU/L
Rough Pigweed  IgE: <0.1 kU/L
Sheep Sorrel IgE: <0.1 kU/L
Timothy Grass: <0.1 kU/L

## 2017-06-21 LAB — ANTINUCLEAR ANTIBODIES, IFA: ANA Titer 1: NEGATIVE

## 2017-06-21 LAB — IGG, IGA, IGM
IMMUNOGLOBULIN A: 216 mg/dL (ref 81–463)
IgG (Immunoglobin G), Serum: 936 mg/dL (ref 694–1618)
IgM, Serum: 24 mg/dL — ABNORMAL LOW (ref 48–271)

## 2017-06-21 LAB — INTERPRETATION:

## 2017-06-21 LAB — CYCLIC CITRUL PEPTIDE ANTIBODY, IGG: Cyclic Citrullin Peptide Ab: <16 UNITS

## 2017-06-21 LAB — RHEUMATOID FACTOR

## 2017-07-04 DIAGNOSIS — J329 Chronic sinusitis, unspecified: Secondary | ICD-10-CM | POA: Diagnosis not present

## 2017-07-05 DIAGNOSIS — J329 Chronic sinusitis, unspecified: Secondary | ICD-10-CM | POA: Diagnosis not present

## 2017-07-14 DIAGNOSIS — H524 Presbyopia: Secondary | ICD-10-CM | POA: Diagnosis not present

## 2017-08-01 ENCOUNTER — Ambulatory Visit: Payer: PPO | Admitting: Pulmonary Disease

## 2017-08-02 ENCOUNTER — Other Ambulatory Visit: Payer: Self-pay

## 2017-08-02 MED ORDER — OMEPRAZOLE 40 MG PO CPDR: 40.0000 mg | DELAYED_RELEASE_CAPSULE | Freq: Two times a day (BID) | ORAL | 2 refills | Status: DC

## 2017-08-02 NOTE — Telephone Encounter (Signed)
Received Rx request from [facility] for Prilosec 40mg .  Rx has been sent to preferred pharmacy.

## 2017-08-03 ENCOUNTER — Ambulatory Visit: Payer: PPO | Admitting: Pulmonary Disease

## 2017-08-06 ENCOUNTER — Other Ambulatory Visit: Payer: Self-pay | Admitting: Pulmonary Disease

## 2017-08-16 ENCOUNTER — Encounter: Payer: Self-pay | Admitting: Pulmonary Disease

## 2017-08-16 ENCOUNTER — Ambulatory Visit: Payer: Medicare HMO | Admitting: Pulmonary Disease

## 2017-08-16 VITALS — BP 126/76 | HR 69 | Ht 59.0 in | Wt 135.6 lb

## 2017-08-16 DIAGNOSIS — R058 Other specified cough: Secondary | ICD-10-CM

## 2017-08-16 DIAGNOSIS — R05 Cough: Secondary | ICD-10-CM

## 2017-08-16 DIAGNOSIS — J329 Chronic sinusitis, unspecified: Secondary | ICD-10-CM | POA: Diagnosis not present

## 2017-08-16 MED ORDER — MONTELUKAST SODIUM 10 MG PO TABS: 10.0000 mg | ORAL_TABLET | Freq: Every day | ORAL | 4 refills | Status: DC

## 2017-08-16 NOTE — Progress Notes (Addendum)
BRISIA SCHUERMANN    [id number]    1946-05-27  Primary Care Physician:Koirala, Dibas, MD  Referring Physician: Lujean Amel, MD [place],  [id number]  Chief complaint:  Follow up for  Upper airway cough syndrome Post nasal drip GERD  HPI: Mrs. Haeberle is a 72 year old with past medical history of asthma.  She has a chief complaint is dyspnea on rest and exertion associated with occasional wheeze. She has daily cough, nonproductive in nature. She has significant issues with acid reflux and is currently on Prilosec 40 a day. She was diagnosed with asthma many years ago. She has seasonal allergies which is worse in spring and fall and reports sensitivity to tree pollen, dust. She does not have any pets at home, no mold issues.  She has been evaluated by cardiology for atypical chest pain. She is also noted to have mildly elevated ANA and has been evaluated by rheumatology. There is no evidence of ILD on previous imaging. She has had recurrent episodes of sinusitis, upper respiratory tract infection requiring antibiotics and prednisone.  Symptoms are mostly confined to upper airway she does not significant symptoms of cough, sputum production, dyspnea or wheezing.. She sings in a church choir and notices hoarseness, shortness of breath with wheezing at times while singing.  Interim history: Seen by ENT with sinus CT which is negative.  She reports stable symptoms mostly sinus congestion, postnasal drip, cough.  She has occasional dyspnea with wheezing.  No fevers, chills, sputum production.  Outpatient Encounter Medications as of 08/16/2017  Medication Sig  . albuterol (PROVENTIL HFA;VENTOLIN HFA) 108 (90 Base) MCG/ACT inhaler Inhale 2 puffs into the lungs every 6 (six) hours as needed for wheezing or shortness of breath.  . ALPRAZolam (XANAX) 0.25 MG tablet Take 0.25 mg by mouth 2 (two) times daily as needed for anxiety.  Marland Kitchen amLODipine  (NORVASC) 2.5 MG tablet Take 2.5 mg by mouth daily.  Marland Kitchen aspirin 81 MG tablet Take 81 mg by mouth daily.  Marland Kitchen atorvastatin (LIPITOR) 40 MG tablet Take 1 tablet (40 mg total) by mouth daily. Needs office visit  . azelastine (ASTELIN) 0.1 % nasal spray Place 2 sprays into both nostrils 2 (two) times daily. Use in each nostril as directed  . budesonide-formoterol (SYMBICORT) 80-4.5 MCG/ACT inhaler Inhale 2 puffs into the lungs 2 (two) times daily.  . calcium carbonate 1250 MG capsule Take 1,250 mg by mouth daily.  . carboxymethylcellulose (REFRESH PLUS) 0.5 % SOLN Place 1 drop into both eyes at bedtime.  . cholecalciferol (VITAMIN D) 1000 units tablet Take 1,000 Units by mouth daily.  . diphenhydrAMINE (BENADRYL) 25 MG tablet Take 25 mg by mouth every 6 (six) hours as needed.  Marland Kitchen FLUoxetine (PROZAC) 40 MG capsule Take 40 mg by mouth daily.  . fluticasone (FLONASE) 50 MCG/ACT nasal spray PLACE 2 SPRAYS INTO BOTH NOSTRILS DAILY.  . meclizine (ANTIVERT) 25 MG tablet Take 25 mg by mouth 3 (three) times daily as needed for dizziness.  . Multiple Vitamins-Minerals (MULTIVITAMIN WITH MINERALS) tablet Take 1 tablet by mouth daily.  Marland Kitchen omeprazole (PRILOSEC) 40 MG capsule Take 1 capsule (40 mg total) by mouth 2 (two) times daily.  Marland Kitchen omeprazole (PRILOSEC) 40 MG capsule TAKE 1 CAPSULE BY MOUTH TWICE A DAY  . Probiotic Product (PROBIOTIC ADVANCED PO) Take 1 capsule by mouth daily.   No facility-administered encounter medications on file as of 08/16/2017.     Allergies as of  08/16/2017 - Review Complete 08/16/2017  Allergen Reaction Noted  . Codeine  12/18/2015  . Bactrim [sulfamethoxazole-trimethoprim] Rash 12/18/2015  . Flagyl [metronidazole] Rash 12/18/2015  . Meloxicam Other (See Comments) 12/18/2015    Past Medical History:  Diagnosis Date  . Allergic rhinitis   . Anxiety   . Arthritis   . Asthma   . Cataract   . Depression   . Hyperlipemia   . Hypertension   . Sinusitis   . Vitamin D deficiency      Past Surgical History:  Procedure Laterality Date  . CARDIAC CATHETERIZATION N/A 12/19/2015   Procedure: Left Heart Cath and Coronary Angiography;  Surgeon: Leonie Man, MD;  Location: [facility];  Service: Cardiovascular;  Laterality: N/A;  . CATARACT EXTRACTION, BILATERAL Right 06/2015  . CATARACT EXTRACTION, BILATERAL Left 07/2015    Family History  Problem Relation Age of Onset  . Coronary artery disease Mother 68       MI in early 60s  . Sick sinus syndrome Mother        required PPM  . Emphysema Father   . Asthma Father   . Heart attack Father     Social History   Socioeconomic History  . Marital status: Widowed    Spouse name: Not on file  . Number of children: Not on file  . Years of education: Not on file  . Highest education level: Not on file  Social Needs  . Financial resource strain: Not on file  . Food insecurity - worry: Not on file  . Food insecurity - inability: Not on file  . Transportation needs - medical: Not on file  . Transportation needs - non-medical: Not on file  Occupational History  . Not on file  Tobacco Use  . Smoking status: Never Smoker  . Smokeless tobacco: Never Used  Substance and Sexual Activity  . Alcohol use: No  . Drug use: No  . Sexual activity: Not on file  Other Topics Concern  . Not on file  Social History Narrative   Widowed, spouse Delfino Lovett passed away 18-Sep-2014 (he was a former patient of Dr Elsworth Soho and Rexene Edison NP)   5 children, all live in [place]: retired, worked in a [employer]: Review of Systems  Constitutional: Negative for fever and chills.  HENT: Negative.   Eyes: Negative for blurred vision.  Respiratory: as per HPI  Cardiovascular: Negative for chest pain and palpitations.  Gastrointestinal: Negative for vomiting, diarrhea, blood per rectum. Genitourinary: Negative for dysuria, urgency, frequency and hematuria.  Musculoskeletal: Negative for myalgias, back  pain and joint pain.  Skin: Negative for itching and rash.  Neurological: Negative for dizziness, tremors, focal weakness, seizures and loss of consciousness.  Endo/Heme/Allergies: Negative for environmental allergies.  Psychiatric/Behavioral: Negative for depression, suicidal ideas and hallucinations.  All other systems reviewed and are negative.  Physical Exam: Blood pressure 126/76, pulse 69, height 4\' 11"  (1.499 m), weight 135 lb 9.6 oz (61.5 kg), SpO2 98 %. Gen:      No acute distress HEENT:  EOMI, sclera anicteric Neck:     No masses; no thyromegaly Lungs:    Clear to auscultation bilaterally; normal respiratory effort CV:         Regular rate and rhythm; no murmurs Abd:      + bowel sounds; soft, non-tender; no palpable masses, no distension Ext:    No edema; adequate peripheral perfusion Skin:  Warm and dry; no rash Neuro: alert and oriented x 3 Psych: normal mood and affect  Data Reviewed: Echo 12/19/15 Left ventricle: The cavity size was normal. Wall thickness was normal. Systolic function was normal. The estimated ejection fraction was in the range of 60% to 65%. Wall motion was normal; there were no regional wall motion abnormalities. Doppler parameters are consistent with abnormal left ventricular relaxation (grade 1 diastolic dysfunction).  CXR 12/18/15- Lungs are clear, no acute cardiopulmonary abnormality. CT high-resolution 06/20/17-no interstitial lung disease, mild air trapping, 4 mm right upper lobe nodule. I reviewed the images personally.  CT sinuses [facility] 07/06/17- 1.No active sinusitis or sinus outflow obstruction. 2. Mild rightward nasal septal deviation and leftward spurring.  PFTs 03/24/16 FVC 1.92 [82%), FEV1 1.7 (90%), F/F 89, TLC 75%, DLCO 61%, corrected 102% Minimal restriction, moderate diffusion defect which corrects for alveolar volume  FENO 03/24/16-14  Labs Allergy profile 06/20/17- IgE less than 2, respiratory negative ANA, CCP,  rheumatoid factor negative IgG 936  Assessment:  Upper airway cough, recurrent sinusitis, GERD She has had recurrent episodes of sinusitis with upper airway infection.  Continues on Flonase nasal spray and Prilosec twice daily.  Start Singulair.  I have asked her to cut down on Chlor-Trimeton.  Seen by ENT with negative sinus CT.  Mild intermittent asthma Suspicion for significant asthma is low as PFTs do not show any obstruction. FENO and IgE are normal. I have asked her to stop the Symbicort as she is having episodes of hoarseness.  Continue on albuterol as needed  CT scan reviewed with no evidence of interstitial lung disease.  There is a small subcentimeter pulmonary nodule which is likely benign in a non-smoker.  Plan/Recommendations: - Continue albuterol, stop Symbicort - Start Singulair, wean down Chlor-Trimeton - Continue Flonase, Prevacid  Marshell Garfinkel MD [facility] Pager [phone] 08/16/2017, 12:01 PM  CC: Lujean Amel, MD

## 2017-08-16 NOTE — Patient Instructions (Signed)
We will start you on Singulair Start cutting down on the Chlor-Trimeton tablets.  You can start using it twice a day and then once a day and then as needed Stop using the Symbicort as I do not believe you have significant asthma Continue albuterol as needed Continue Prilosec for acid reflux Follow-up in 3 months.

## 2017-08-29 ENCOUNTER — Other Ambulatory Visit: Payer: Self-pay | Admitting: Pulmonary Disease

## 2017-09-10 ENCOUNTER — Other Ambulatory Visit: Payer: Self-pay | Admitting: Cardiology

## 2017-10-29 ENCOUNTER — Other Ambulatory Visit: Payer: Self-pay | Admitting: Pulmonary Disease

## 2017-11-01 DIAGNOSIS — M810 Age-related osteoporosis without current pathological fracture: Secondary | ICD-10-CM | POA: Diagnosis not present

## 2017-11-01 DIAGNOSIS — Z79899 Other long term (current) drug therapy: Secondary | ICD-10-CM | POA: Diagnosis not present

## 2017-11-01 DIAGNOSIS — I10 Essential (primary) hypertension: Secondary | ICD-10-CM | POA: Diagnosis not present

## 2017-11-01 DIAGNOSIS — J309 Allergic rhinitis, unspecified: Secondary | ICD-10-CM | POA: Diagnosis not present

## 2017-11-01 DIAGNOSIS — J45909 Unspecified asthma, uncomplicated: Secondary | ICD-10-CM | POA: Diagnosis not present

## 2017-11-01 DIAGNOSIS — F321 Major depressive disorder, single episode, moderate: Secondary | ICD-10-CM | POA: Diagnosis not present

## 2017-11-01 DIAGNOSIS — F419 Anxiety disorder, unspecified: Secondary | ICD-10-CM | POA: Diagnosis not present

## 2017-11-06 ENCOUNTER — Other Ambulatory Visit: Payer: Self-pay | Admitting: Pulmonary Disease

## 2017-11-21 ENCOUNTER — Ambulatory Visit: Payer: Medicare HMO | Admitting: Pulmonary Disease

## 2017-12-13 ENCOUNTER — Encounter: Payer: Self-pay | Admitting: Pulmonary Disease

## 2017-12-13 ENCOUNTER — Ambulatory Visit: Payer: Medicare HMO | Admitting: Pulmonary Disease

## 2017-12-13 VITALS — BP 132/78 | HR 62 | Ht 59.0 in | Wt 133.0 lb

## 2017-12-13 DIAGNOSIS — R05 Cough: Secondary | ICD-10-CM

## 2017-12-13 DIAGNOSIS — J329 Chronic sinusitis, unspecified: Secondary | ICD-10-CM | POA: Diagnosis not present

## 2017-12-13 DIAGNOSIS — R058 Other specified cough: Secondary | ICD-10-CM

## 2017-12-13 NOTE — Progress Notes (Signed)
Brenda Bautista    [id number]    07-Apr-1946  Primary Care Physician:Koirala, Dibas, MD  Referring Physician: Lujean Amel, MD [address], [place] [address]  Chief complaint:  Follow up for  Upper airway cough syndrome Post nasal drip GERD  HPI: Brenda Bautista is a 72 year old with past medical history of asthma.  She has a chief complaint is dyspnea on rest and exertion associated with occasional wheeze. She has daily cough, nonproductive in nature. She has significant issues with acid reflux and is currently on Prilosec 40 a day. She was diagnosed with asthma many years ago. She has seasonal allergies which is worse in spring and fall and reports sensitivity to tree pollen, dust. She does not have any pets at home, no mold issues.  She has been evaluated by cardiology for atypical chest pain. She is also noted to have mildly elevated ANA and has been evaluated by rheumatology. There is no evidence of ILD on previous imaging. She has had recurrent episodes of sinusitis, upper respiratory tract infection requiring antibiotics and prednisone.  Symptoms are mostly confined to upper airway she does not significant symptoms of cough, sputum production, dyspnea or wheezing.. She sings in a church choir and notices hoarseness, shortness of breath with wheezing at times while singing.  Seen by ENT at [facility] in January 2019 with sinus CT which is negative.  Interim history: Symbicort stopped at last visit.  Patient states that her breathing actually got better after this She is using albuterol about 3-4 times a week, no nocturnal awakenings She has occasional cough, nonproductive in nature.  Outpatient Encounter Medications as of 12/13/2017  Medication Sig  . albuterol (PROVENTIL HFA;VENTOLIN HFA) 108 (90 Base) MCG/ACT inhaler Inhale 2 puffs into the lungs every 6 (six) hours as needed for wheezing or shortness of breath.  . ALPRAZolam (XANAX)  0.25 MG tablet Take 0.25 mg by mouth 2 (two) times daily as needed for anxiety.  Marland Kitchen amLODipine (NORVASC) 2.5 MG tablet Take 2.5 mg by mouth daily.  Marland Kitchen aspirin 81 MG tablet Take 81 mg by mouth daily.  Marland Kitchen atorvastatin (LIPITOR) 40 MG tablet TAKE 1 TABLET BY MOUTH EVERY DAY  . azelastine (ASTELIN) 0.1 % nasal spray Place 2 sprays into both nostrils 2 (two) times daily. Use in each nostril as directed  . calcium carbonate 1250 MG capsule Take 1,250 mg by mouth daily.  . carboxymethylcellulose (REFRESH PLUS) 0.5 % SOLN Place 1 drop into both eyes at bedtime.  . chlorpheniramine (CHLOR-TRIMETON) 4 MG tablet Take 4 mg by mouth 2 (two) times daily.  . cholecalciferol (VITAMIN D) 1000 units tablet Take 1,000 Units by mouth daily.  . diphenhydrAMINE (BENADRYL) 25 MG tablet Take 25 mg by mouth every 6 (six) hours as needed.  Marland Kitchen FLUoxetine (PROZAC) 40 MG capsule Take 40 mg by mouth daily.  . fluticasone (FLONASE) 50 MCG/ACT nasal spray PLACE 2 SPRAYS INTO BOTH NOSTRILS DAILY.  . meclizine (ANTIVERT) 25 MG tablet Take 25 mg by mouth 3 (three) times daily as needed for dizziness.  . montelukast (SINGULAIR) 10 MG tablet TAKE 1 TABLET BY MOUTH EVERY DAY  . Multiple Vitamins-Minerals (MULTIVITAMIN WITH MINERALS) tablet Take 1 tablet by mouth daily.  Marland Kitchen omeprazole (PRILOSEC) 40 MG capsule Take 1 capsule (40 mg total) by mouth 2 (two) times daily.  . Probiotic Product (PROBIOTIC ADVANCED PO) Take 1 capsule by mouth daily.  . [DISCONTINUED] omeprazole (PRILOSEC) 40 MG  capsule TAKE 1 CAPSULE BY MOUTH TWICE A DAY  . [DISCONTINUED] omeprazole (PRILOSEC) 40 MG capsule TAKE 1 CAPSULE BY MOUTH TWICE A DAY   No facility-administered encounter medications on file as of 12/13/2017.     Allergies as of 12/13/2017 - Review Complete 12/13/2017  Allergen Reaction Noted  . Codeine  12/18/2015  . Bactrim [sulfamethoxazole-trimethoprim] Rash 12/18/2015  . Flagyl [metronidazole] Rash 12/18/2015  . Meloxicam Other (See Comments)  12/18/2015    Past Medical History:  Diagnosis Date  . Allergic rhinitis   . Anxiety   . Arthritis   . Asthma   . Cataract   . Depression   . Hyperlipemia   . Hypertension   . Sinusitis   . Vitamin D deficiency     Past Surgical History:  Procedure Laterality Date  . CARDIAC CATHETERIZATION N/A 12/19/2015   Procedure: Left Heart Cath and Coronary Angiography;  Surgeon: Leonie Man, MD;  Location: [facility];  Service: Cardiovascular;  Laterality: N/A;  . CATARACT EXTRACTION, BILATERAL Right 06/2015  . CATARACT EXTRACTION, BILATERAL Left 07/2015    Family History  Problem Relation Age of Onset  . Coronary artery disease Mother 11       MI in early 52s  . Sick sinus syndrome Mother        required PPM  . Emphysema Father   . Asthma Father   . Heart attack Father     Social History   Socioeconomic History  . Marital status: Widowed    Spouse name: Not on file  . Number of children: Not on file  . Years of education: Not on file  . Highest education level: Not on file  Occupational History  . Not on file  Social Needs  . Financial resource strain: Not on file  . Food insecurity:    Worry: Not on file    Inability: Not on file  . Transportation needs:    Medical: Not on file    Non-medical: Not on file  Tobacco Use  . Smoking status: Never Smoker  . Smokeless tobacco: Never Used  Substance and Sexual Activity  . Alcohol use: No  . Drug use: No  . Sexual activity: Not on file  Lifestyle  . Physical activity:    Days per week: Not on file    Minutes per session: Not on file  . Stress: Not on file  Relationships  . Social connections:    Talks on phone: Not on file    Gets together: Not on file    Attends religious service: Not on file    Active member of club or organization: Not on file    Attends meetings of clubs or organizations: Not on file    Relationship status: Not on file  . Intimate partner violence:    Fear of current or ex  partner: Not on file    Emotionally abused: Not on file    Physically abused: Not on file    Forced sexual activity: Not on file  Other Topics Concern  . Not on file  Social History Narrative   Widowed, spouse Delfino Lovett passed away 13-Sep-2014 (he was a former patient of Dr Elsworth Soho and Rexene Edison NP)   5 children, all live in [place]: retired, worked in a [employer]: Review of Systems  Constitutional: Negative for fever and chills.  HENT: Negative.   Eyes: Negative for blurred vision.  Respiratory: as per  HPI  Cardiovascular: Negative for chest pain and palpitations.  Gastrointestinal: Negative for vomiting, diarrhea, blood per rectum. Genitourinary: Negative for dysuria, urgency, frequency and hematuria.  Musculoskeletal: Negative for myalgias, back pain and joint pain.  Skin: Negative for itching and rash.  Neurological: Negative for dizziness, tremors, focal weakness, seizures and loss of consciousness.  Endo/Heme/Allergies: Negative for environmental allergies.  Psychiatric/Behavioral: Negative for depression, suicidal ideas and hallucinations.  All other systems reviewed and are negative.  Physical Exam: Blood pressure 132/78, pulse 62, height 4\' 11"  (1.499 m), weight 133 lb (60.3 kg), SpO2 95 %. Gen:      No acute distress HEENT:  EOMI, sclera anicteric Neck:     No masses; no thyromegaly Lungs:    Clear to auscultation bilaterally; normal respiratory effort CV:         Regular rate and rhythm; no murmurs Abd:      + bowel sounds; soft, non-tender; no palpable masses, no distension Ext:    No edema; adequate peripheral perfusion Skin:      Warm and dry; no rash Neuro: alert and oriented x 3 Psych: normal mood and affect  Data Reviewed: Echo 12/19/15 Left ventricle: The cavity size was normal. Wall thickness was normal. Systolic function was normal. The estimated ejection fraction was in the range of 60% to 65%. Wall motion was normal; there  were no regional wall motion abnormalities. Doppler parameters are consistent with abnormal left ventricular relaxation (grade 1 diastolic dysfunction).  CXR 12/18/15- Lungs are clear, no acute cardiopulmonary abnormality. CT high-resolution 06/20/17-no interstitial lung disease, mild air trapping, 4 mm right upper lobe nodule. I reviewed the images personally.  CT sinuses [facility] 07/06/17- 1.No active sinusitis or sinus outflow obstruction. 2. Mild rightward nasal septal deviation and leftward spurring.  PFTs 03/24/16 FVC 1.92 [82%), FEV1 1.7 (90%), F/F 89, TLC 75%, DLCO 61%, corrected 102% Minimal restriction, moderate diffusion defect which corrects for alveolar volume  FENO 03/24/16-14  Labs Allergy profile 06/20/17- IgE less than 2, RAST panel negative ANA, CCP, rheumatoid factor negative IgG 936  Assessment:  Upper airway cough, recurrent sinusitis, GERD She has had recurrent episodes of sinusitis with upper airway infection. Seen by ENT with negative sinus CT. Continues on singulair, Flonase nasal spray and Prilosec twice daily.    Mild intermittent asthma Suspicion for significant asthma is low as PFTs do not show any obstruction. FENO and IgE are normal. Symptoms are stable after stopping Symbicort.  Continue albuterol as needed.  CT scan reviewed with no evidence of interstitial lung disease.  There is a small subcentimeter pulmonary nodule which is likely benign in a non-smoker.  Plan/Recommendations: -  Albuterol as needed. - Continue Flonase, Prilosec, Singulair  Marshell Garfinkel MD [facility] Pager [phone] 12/13/2017, 11:01 AM  CC: Lujean Amel, MD

## 2017-12-13 NOTE — Patient Instructions (Signed)
I am glad you are feeling well Continue albuterol as needed Follow-up in 6 months.  Please call us sooner if you have any change in his symptoms.

## 2018-02-05 ENCOUNTER — Encounter (HOSPITAL_COMMUNITY): Payer: Self-pay | Admitting: Emergency Medicine

## 2018-02-05 ENCOUNTER — Emergency Department (HOSPITAL_COMMUNITY): Payer: Medicare HMO

## 2018-02-05 ENCOUNTER — Emergency Department (HOSPITAL_COMMUNITY)
Admission: EM | Admit: 2018-02-05 | Discharge: 2018-02-05 | Disposition: A | Discharge status: Home / Self Care | Payer: Medicare HMO | Attending: Emergency Medicine | Admitting: Emergency Medicine

## 2018-02-05 DIAGNOSIS — S63501A Unspecified sprain of right wrist, initial encounter: Secondary | ICD-10-CM | POA: Insufficient documentation

## 2018-02-05 DIAGNOSIS — S6991XA Unspecified injury of right wrist, hand and finger(s), initial encounter: Secondary | ICD-10-CM | POA: Diagnosis not present

## 2018-02-05 DIAGNOSIS — Y93E8 Activity, other personal hygiene: Secondary | ICD-10-CM | POA: Diagnosis not present

## 2018-02-05 DIAGNOSIS — Y929 Unspecified place or not applicable: Secondary | ICD-10-CM | POA: Insufficient documentation

## 2018-02-05 DIAGNOSIS — Y999 Unspecified external cause status: Secondary | ICD-10-CM | POA: Insufficient documentation

## 2018-02-05 DIAGNOSIS — W010XXA Fall on same level from slipping, tripping and stumbling without subsequent striking against object, initial encounter: Secondary | ICD-10-CM | POA: Diagnosis not present

## 2018-02-05 DIAGNOSIS — M25531 Pain in right wrist: Secondary | ICD-10-CM | POA: Diagnosis not present

## 2018-02-05 DIAGNOSIS — S6391XA Sprain of unspecified part of right wrist and hand, initial encounter: Secondary | ICD-10-CM | POA: Diagnosis not present

## 2018-02-05 MED ORDER — HYDROCODONE-ACETAMINOPHEN 5-325 MG PO TABS
1.0000 | ORAL_TABLET | Freq: Once | ORAL | Status: AC
  Administered 2018-02-05: 1 via ORAL
  Filled 2018-02-05: qty 1

## 2018-02-05 MED ORDER — HYDROCODONE-ACETAMINOPHEN 5-325 MG PO TABS: 2.0000 | ORAL_TABLET | ORAL | 0 refills | Status: DC | PRN

## 2018-02-05 NOTE — ED Triage Notes (Signed)
Pt reports tripping and falling while trying to change landing on R arm. Pt presents with pain and mild swelling to R wrist/hand area. No obvious deformity noted.

## 2018-02-05 NOTE — ED Provider Notes (Signed)
[facility] EMERGENCY DEPARTMENT Provider Note   CSN: [id number] Arrival date & time: 02/05/18  1937     History   Chief Complaint Chief Complaint  Patient presents with  . Arm Pain    HPI Brenda Bautista is a 72 y.o. female who presents with a fall and right wrist pain. She states she was changing her skirt when she suddenly fell and tried to break her fall with her right hand outstretched. She had immediate pain and swelling of the right wrist. It radiates to the forearm. She has difficulty moving her fingers secondary to pain. No numbness/tingling. No elbow pain. No head injury, chest pain, SOB, abdominal pain, lower extremity pain. She did not pass out or feel lightheaded. She has been ambulatory.  HPI  Past Medical History:  Diagnosis Date  . Allergic rhinitis   . Anxiety   . Arthritis   . Asthma   . Cataract   . Depression   . Hyperlipemia   . Hypertension   . Sinusitis   . Vitamin D deficiency     Patient Active Problem List   Diagnosis Date Noted  . Upper airway cough syndrome 03/24/2016  . Malar rash 12/20/2015  . Fatigue 12/20/2015  . Shortness of breath 12/20/2015  . Atypical chest pain   . Essential hypertension 12/18/2015  . Asthma 12/18/2015  . GERD (gastroesophageal reflux disease) 12/18/2015  . Hyperlipidemia with target LDL less than 100 12/18/2015  . OA (osteoarthritis) 12/18/2015    Past Surgical History:  Procedure Laterality Date  . CARDIAC CATHETERIZATION N/A 12/19/2015   Procedure: Left Heart Cath and Coronary Angiography;  Surgeon: Leonie Man, MD;  Location: [facility];  Service: Cardiovascular;  Laterality: N/A;  . CATARACT EXTRACTION, BILATERAL Right 06/2015  . CATARACT EXTRACTION, BILATERAL Left 07/2015     OB History   None      Home Medications    Prior to Admission medications   Medication Sig Start Date End Date Taking? Authorizing Provider  albuterol (PROVENTIL HFA;VENTOLIN HFA) 108  (90 Base) MCG/ACT inhaler Inhale 2 puffs into the lungs every 6 (six) hours as needed for wheezing or shortness of breath.    [provider]  ALPRAZolam Duanne Moron) 0.25 MG tablet Take 0.25 mg by mouth 2 (two) times daily as needed for anxiety.    [provider]  amLODipine (NORVASC) 2.5 MG tablet Take 2.5 mg by mouth daily.    [provider]  aspirin 81 MG tablet Take 81 mg by mouth daily.    [provider]  atorvastatin (LIPITOR) 40 MG tablet TAKE 1 TABLET BY MOUTH EVERY DAY 09/12/17   Leonie Man, MD  azelastine (ASTELIN) 0.1 % nasal spray Place 2 sprays into both nostrils 2 (two) times daily. Use in each nostril as directed 05/20/17   Mannam, Hart Robinsons, MD  calcium carbonate 1250 MG capsule Take 1,250 mg by mouth daily.    [provider]  carboxymethylcellulose (REFRESH PLUS) 0.5 % SOLN Place 1 drop into both eyes at bedtime.    [provider]  chlorpheniramine (CHLOR-TRIMETON) 4 MG tablet Take 4 mg by mouth 2 (two) times daily.    [provider]  cholecalciferol (VITAMIN D) 1000 units tablet Take 1,000 Units by mouth daily.    [provider]  diphenhydrAMINE (BENADRYL) 25 MG tablet Take 25 mg by mouth every 6 (six) hours as needed.    [provider]  FLUoxetine (PROZAC) 40 MG capsule Take  40 mg by mouth daily.    [provider]  fluticasone (FLONASE) 50 MCG/ACT nasal spray PLACE 2 SPRAYS INTO BOTH NOSTRILS DAILY. 02/17/17   Mannam, Hart Robinsons, MD  meclizine (ANTIVERT) 25 MG tablet Take 25 mg by mouth 3 (three) times daily as needed for dizziness.    [provider]  montelukast (SINGULAIR) 10 MG tablet TAKE 1 TABLET BY MOUTH EVERY DAY 11/07/17   Mannam, Praveen, MD  Multiple Vitamins-Minerals (MULTIVITAMIN WITH MINERALS) tablet Take 1 tablet by mouth daily.    [provider]  omeprazole (PRILOSEC) 40 MG capsule Take 1 capsule (40 mg total) by mouth 2 (two) times daily. 08/02/17   Marshell Garfinkel, MD  Probiotic Product (PROBIOTIC ADVANCED PO) Take 1 capsule by mouth daily.    [provider]    Family History Family History  Problem Relation Age of Onset  . Coronary artery disease Mother 48       MI in early 33s  . Sick sinus syndrome Mother        required PPM  . Emphysema Father   . Asthma Father   . Heart attack Father     Social History Social History   Tobacco Use  . Smoking status: Never Smoker  . Smokeless tobacco: Never Used  Substance Use Topics  . Alcohol use: No  . Drug use: No     Allergies   Codeine; Bactrim [sulfamethoxazole-trimethoprim]; Flagyl [metronidazole]; and Meloxicam   Review of Systems Review of Systems  Respiratory: Negative for shortness of breath.   Cardiovascular: Negative for chest pain.  Gastrointestinal: Negative for abdominal pain.  Musculoskeletal: Positive for arthralgias and joint swelling. Negative for back pain.  Skin: Positive for color change. Negative for wound.  Neurological: Negative for syncope.     Physical Exam Updated Vital Signs BP (!) 141/65 (BP Location: Left Arm)   Pulse 69   Temp 98.3 F (36.8 C) (Oral)   Resp 14   Ht 5' (1.524 m)   Wt 58.1 kg   SpO2 99%   BMI 25.00 kg/m   Physical Exam  Constitutional: She is oriented to person, place, and time. She appears well-developed and well-nourished. No distress.  HENT:  Head: Normocephalic and atraumatic.  Eyes: Pupils are equal, round, and reactive to light. Conjunctivae are normal. Right eye exhibits no discharge. Left eye exhibits no discharge. No scleral icterus.  Neck: Normal range of motion.  Cardiovascular: Normal rate and regular rhythm.  Pulmonary/Chest: Effort normal and breath sounds normal. No respiratory distress.  Abdominal: She exhibits no distension.  Musculoskeletal:  Right wrist. Moderate, diffuse swelling. Diffuse tenderness. +Snuffbox tenderness. Able to wiggle fingers back and forth. Sensation intact. 2+ radial  pulse. No elbow or shoulder tenderness  Neurological: She is alert and oriented to person, place, and time.  Skin: Skin is warm and dry.  Psychiatric: She has a normal mood and affect. Her behavior is normal.  Nursing note and vitals reviewed.    ED Treatments / Results  Labs (all labs ordered are listed, but only abnormal results are displayed) Labs Reviewed - No data to display  EKG None  Radiology Dg Wrist Complete Right  Result Date: 02/05/2018 CLINICAL DATA:  Patient status post fall. Wrist pain. Initial encounter. EXAM: RIGHT WRIST - COMPLETE 3+ VIEW COMPARISON:  None. FINDINGS: Normal anatomic alignment. No evidence for acute fracture. [facility] joint degenerative changes. IMPRESSION: No acute osseous abnormality. Electronically Signed   By: Lovey Newcomer M.D.   On: 02/05/2018  20:27    Procedures Procedures (including critical care time)  Medications Ordered in ED Medications  HYDROcodone-acetaminophen (NORCO/VICODIN) 5-325 MG per tablet 1 tablet (1 tablet Oral Given 02/05/18 2105)     Initial Impression / Assessment and Plan / ED Course  I have reviewed the triage vital signs and the nursing notes.  Pertinent labs & imaging results that were available during my care of the patient were reviewed by me and considered in my medical decision making (see chart for details).  72 year old female with what sounds like a mechanical fall (no prodromal symptoms, no syncope) and right wrist injury. Xray is negative but she has snuffbox tenderness. Will place in velcro sugar tong splint and discussed rest, ice, elevation. Shared visit with Dr. Dayna Barker. Advised ortho f/u.  Final Clinical Impressions(s) / ED Diagnoses   Final diagnoses:  Sprain of right wrist, initial encounter    ED Discharge Orders    None       Recardo Evangelist, PA-C 02/06/18 1347    Mesner, Corene Cornea, MD 02/08/18 [phone]

## 2018-02-05 NOTE — Discharge Instructions (Signed)
Please rest, ice, and elevate the arm to keep swelling down Please follow up with Dr. Amedeo Plenty in one week Take pain medicine as prescribed. You can also take Ibuprofen with this Return if you are worsening

## 2018-02-05 NOTE — ED Provider Notes (Signed)
Medical screening examination/treatment/procedure(s) were conducted as a shared visit with non-physician practitioner(s) and myself.  I personally evaluated the patient during the encounter.  Mechanical fall with resulting [place] injury.  No fracture on x-ray but significantly tender with range of motion and palpation of her wrist so we will splint and have her follow-up with her primary doctor for repeat x-rays in 1 week. No proximal pain. No injuries elsewhere. No head trauma.   Merrily Pew, MD 02/08/18 [phone]

## 2018-02-09 DIAGNOSIS — Z23 Encounter for immunization: Secondary | ICD-10-CM | POA: Diagnosis not present

## 2018-02-09 DIAGNOSIS — S63501S Unspecified sprain of right wrist, sequela: Secondary | ICD-10-CM | POA: Diagnosis not present

## 2018-02-09 DIAGNOSIS — S63501D Unspecified sprain of right wrist, subsequent encounter: Secondary | ICD-10-CM | POA: Diagnosis not present

## 2018-02-09 DIAGNOSIS — W19XXXD Unspecified fall, subsequent encounter: Secondary | ICD-10-CM | POA: Diagnosis not present

## 2018-02-14 ENCOUNTER — Other Ambulatory Visit: Payer: Self-pay | Admitting: Pulmonary Disease

## 2018-04-23 ENCOUNTER — Other Ambulatory Visit: Payer: Self-pay | Admitting: Pulmonary Disease

## 2018-06-19 DIAGNOSIS — M19049 Primary osteoarthritis, unspecified hand: Secondary | ICD-10-CM | POA: Diagnosis not present

## 2018-06-19 DIAGNOSIS — F419 Anxiety disorder, unspecified: Secondary | ICD-10-CM | POA: Diagnosis not present

## 2018-06-19 DIAGNOSIS — R413 Other amnesia: Secondary | ICD-10-CM | POA: Diagnosis not present

## 2018-06-19 DIAGNOSIS — F321 Major depressive disorder, single episode, moderate: Secondary | ICD-10-CM | POA: Diagnosis not present

## 2018-06-19 DIAGNOSIS — I10 Essential (primary) hypertension: Secondary | ICD-10-CM | POA: Diagnosis not present

## 2018-06-19 DIAGNOSIS — E7800 Pure hypercholesterolemia, unspecified: Secondary | ICD-10-CM | POA: Diagnosis not present

## 2018-07-10 ENCOUNTER — Other Ambulatory Visit: Payer: Self-pay | Admitting: Cardiology

## 2018-09-25 ENCOUNTER — Other Ambulatory Visit: Payer: Self-pay | Admitting: Pulmonary Disease

## 2018-09-27 ENCOUNTER — Other Ambulatory Visit: Payer: Self-pay | Admitting: Pulmonary Disease

## 2018-10-26 ENCOUNTER — Other Ambulatory Visit: Payer: Self-pay | Admitting: Pulmonary Disease

## 2018-11-21 ENCOUNTER — Other Ambulatory Visit: Payer: Self-pay | Admitting: Physician Assistant

## 2018-11-21 DIAGNOSIS — M810 Age-related osteoporosis without current pathological fracture: Secondary | ICD-10-CM

## 2018-12-18 ENCOUNTER — Other Ambulatory Visit: Payer: Self-pay | Admitting: Pulmonary Disease

## 2018-12-21 ENCOUNTER — Telehealth: Payer: Self-pay | Admitting: Cardiovascular Disease

## 2018-12-21 NOTE — Telephone Encounter (Signed)
°  Patient has new health insurance with [employer] and [place]. Patient would like to continue coming to the practice as long as we are covered by our insurance.

## 2018-12-21 NOTE — Telephone Encounter (Signed)
lm 12-21-18 for recall

## 2019-02-11 IMAGING — US US ABDOMEN COMPLETE
1 series · 13 of 25 positions shown · non-contrast
Comparison: 12/18/2015

CLINICAL DATA: Right upper quadrant pain for 2 weeks. Nausea,
vomiting, fever.

EXAM:
ABDOMEN ULTRASOUND COMPLETE

[Series 1: us abdomen complete · 0.17mm/px · 13 of 85 slices shown]
[im 1/85]
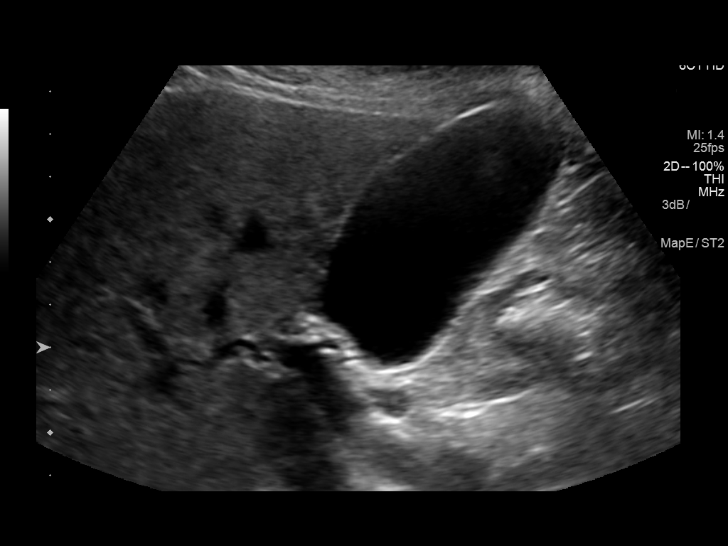
[im 8/85]
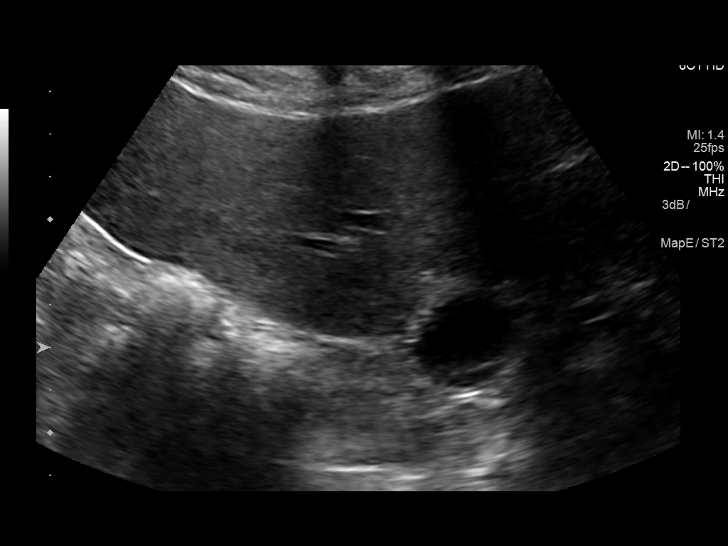
[im 15/85]
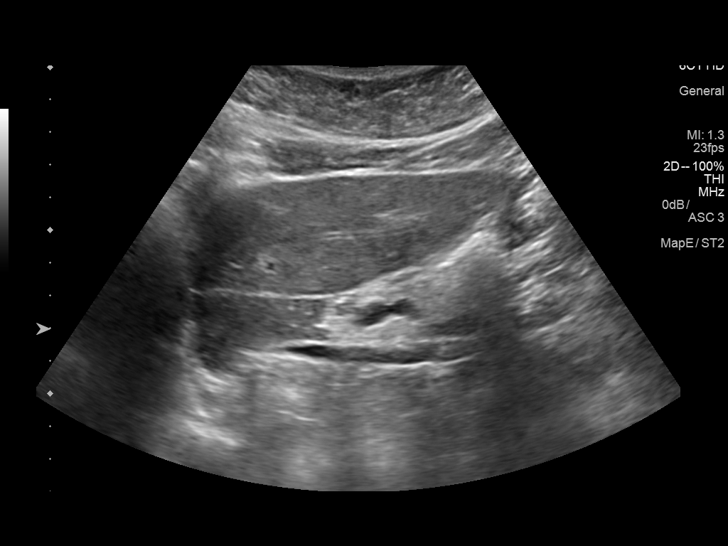
[im 22/85]
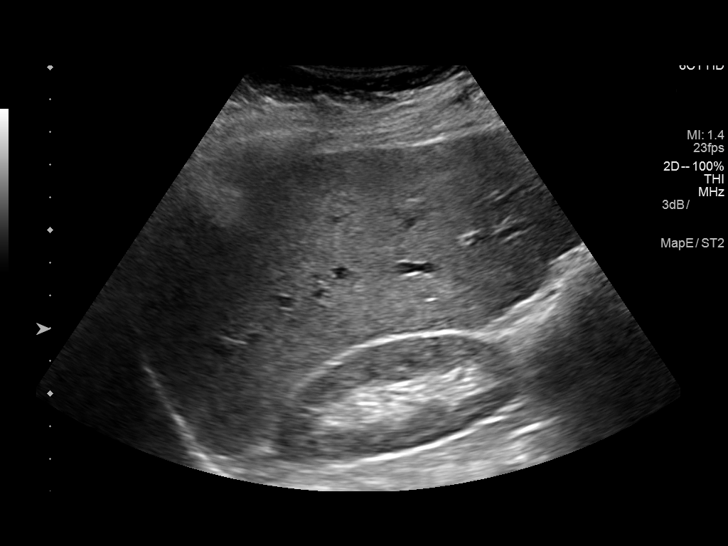
[im 29/85]
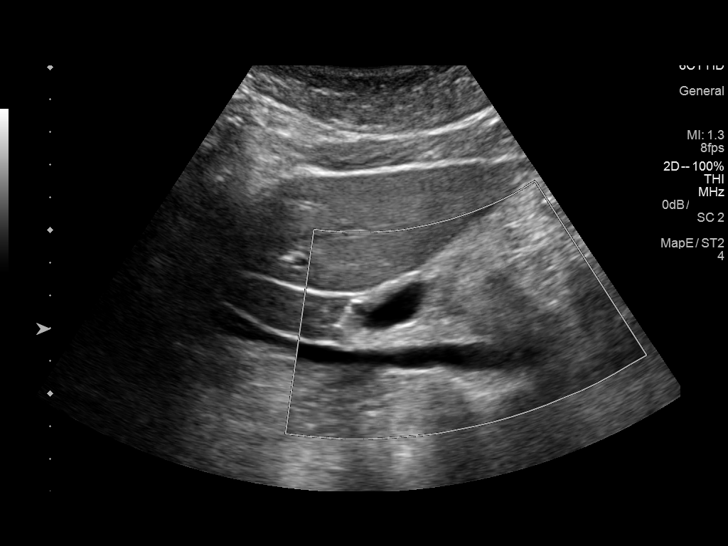
[im 36/85]
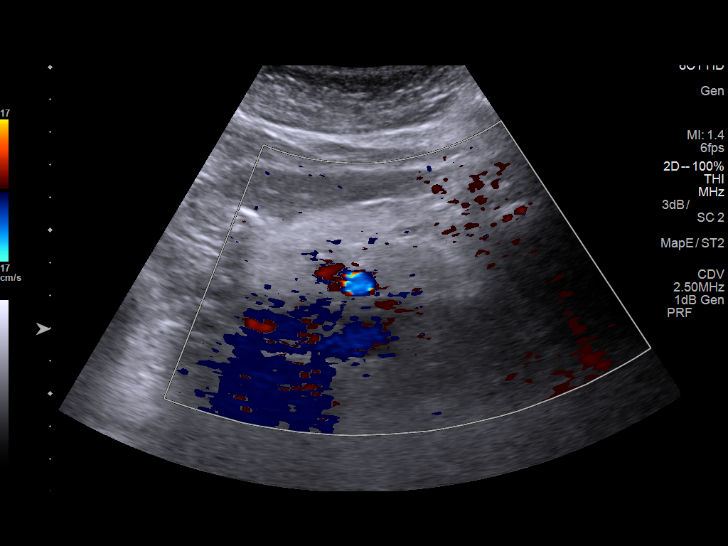
[im 43/85]
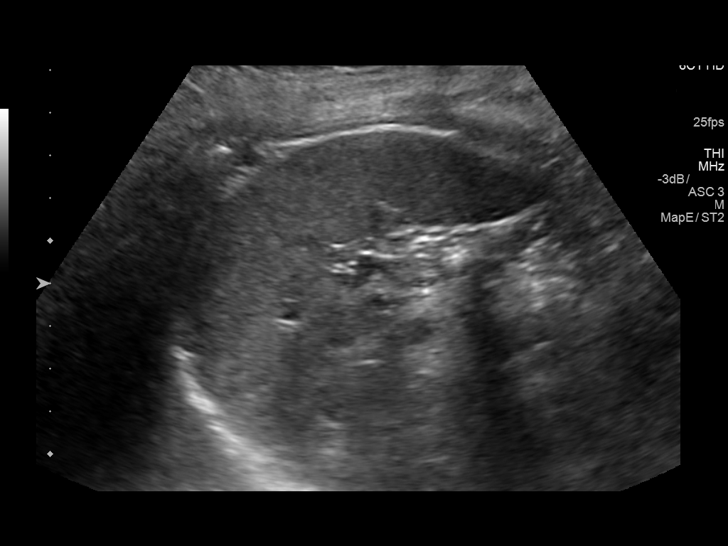
[im 50/85]
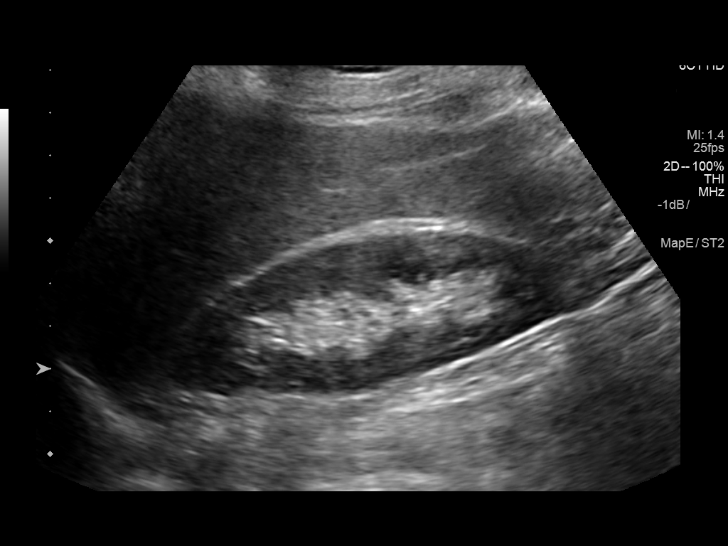
[im 57/85]
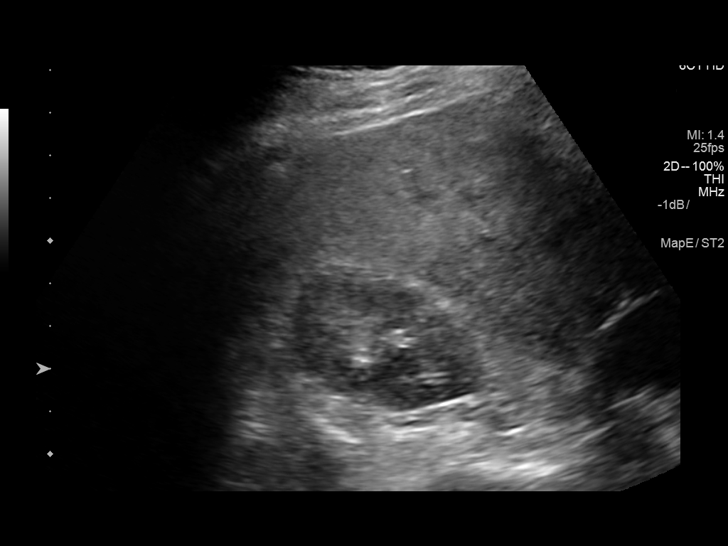
[im 64/85]
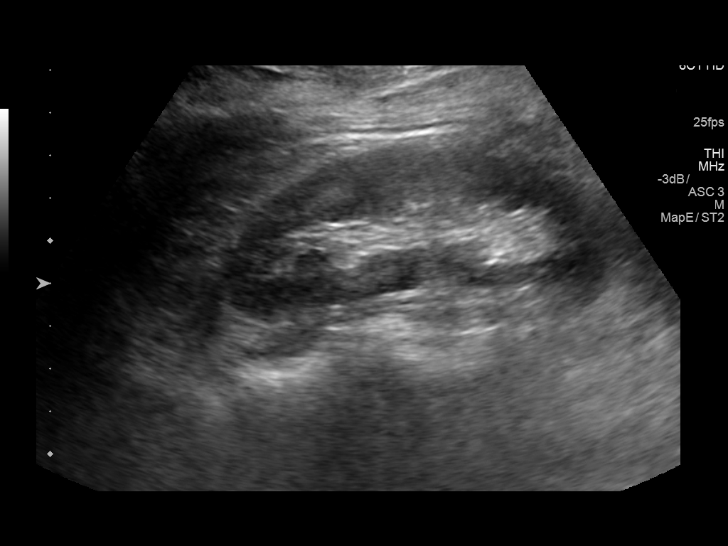
[im 71/85]
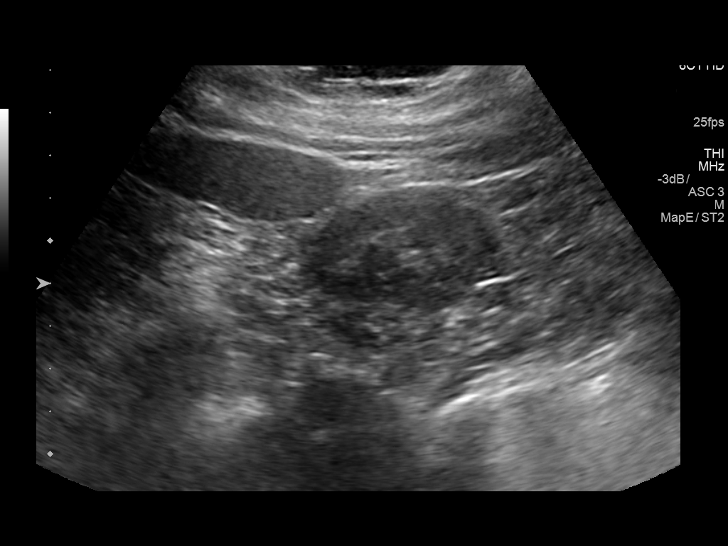
[im 78/85]
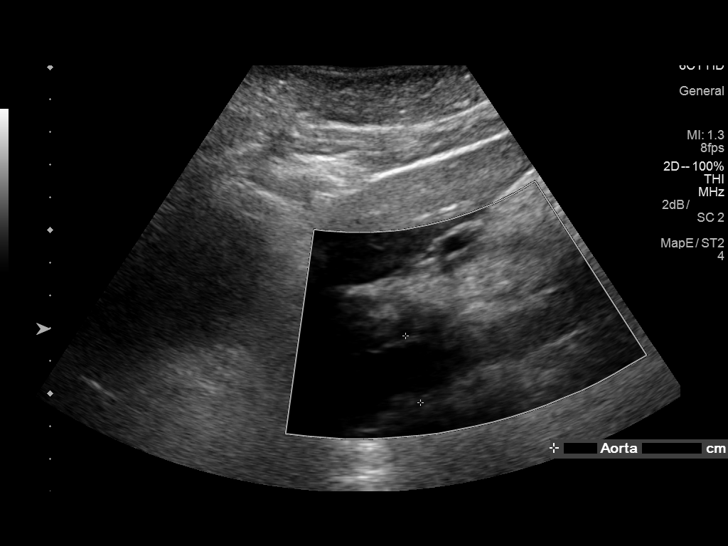
[im 85/85]
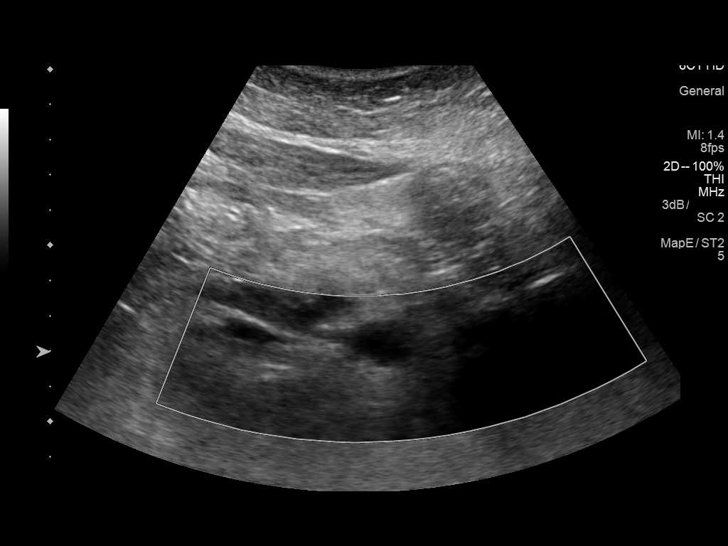

[13 of 25 positions shown; findings below may reference images not displayed]

FINDINGS: Gallbladder: Gallbladder has a normal appearance. Gallbladder wall
is 2.5 mm, within normal limits. No stones or pericholecystic fluid.
No sonographic Murphy's sign.

Common bile duct: Diameter: 4.8 mm- 6.4 mm, normal in appearance.

Liver: Mildly echogenic without other features of hepatic steatosis.
No focal liver lesions. Color Doppler indicates hepatopetal main
portal vein flow.

IVC: Visualized portion unremarkable.

Pancreas: Mildly prominent pancreatic duct without focal pancreatic
lesion.

Spleen: 7.8 cm.

Right Kidney: Length: 8.5 cm. Echogenicity within normal limits. No
mass or hydronephrosis visualized.

Left Kidney: Length: 9.2 cm. No hydronephrosis. Stable appearance of
the hyperechoic lesion in the upper pole measuring 0.8 x 0.8 x
cm and consistent with angiomyolipoma.

Abdominal aorta: No aneurysm visualized.

Other findings: None
IMPRESSION: 1. No evidence for acute cholecystitis.
2. Mild hepatic echogenicity without other features of hepatic
steatosis.
3. Hepatopetal flow in the portal vein.
4. Stable appearance of hyperechoic lesion in the upper pole of the
left kidney consistent with probable angiomyolipoma.

## 2019-02-19 ENCOUNTER — Ambulatory Visit
Admission: RE | Admit: 2019-02-19 | Discharge: 2019-02-19 | Disposition: A | Discharge status: Home / Self Care | Payer: Medicare HMO | Source: Ambulatory Visit | Attending: Physician Assistant | Admitting: Physician Assistant

## 2019-02-19 ENCOUNTER — Other Ambulatory Visit: Payer: Self-pay

## 2019-02-19 ENCOUNTER — Other Ambulatory Visit: Payer: Self-pay | Admitting: Physician Assistant

## 2019-02-19 DIAGNOSIS — T1490XA Injury, unspecified, initial encounter: Secondary | ICD-10-CM

## 2019-10-16 ENCOUNTER — Ambulatory Visit
Admission: RE | Admit: 2019-10-16 | Discharge: 2019-10-16 | Disposition: A | Discharge status: Home / Self Care | Payer: Medicare Other | Source: Ambulatory Visit | Attending: Physician Assistant | Admitting: Physician Assistant

## 2019-10-16 ENCOUNTER — Other Ambulatory Visit: Payer: Self-pay

## 2019-10-16 DIAGNOSIS — M810 Age-related osteoporosis without current pathological fracture: Secondary | ICD-10-CM

## 2019-10-16 LAB — HM DEXA SCAN

## 2019-11-14 ENCOUNTER — Other Ambulatory Visit: Payer: Self-pay | Admitting: Physician Assistant

## 2019-11-14 ENCOUNTER — Ambulatory Visit
Admission: RE | Admit: 2019-11-14 | Discharge: 2019-11-14 | Disposition: A | Discharge status: Home / Self Care | Payer: Medicare Other | Source: Ambulatory Visit | Attending: Physician Assistant | Admitting: Physician Assistant

## 2019-11-14 ENCOUNTER — Other Ambulatory Visit: Payer: Self-pay

## 2019-11-14 DIAGNOSIS — R109 Unspecified abdominal pain: Secondary | ICD-10-CM

## 2020-01-22 ENCOUNTER — Other Ambulatory Visit (HOSPITAL_COMMUNITY): Payer: Self-pay | Admitting: Endocrinology

## 2020-01-22 DIAGNOSIS — R109 Unspecified abdominal pain: Secondary | ICD-10-CM

## 2020-01-30 ENCOUNTER — Ambulatory Visit (HOSPITAL_COMMUNITY): Payer: Medicare Other

## 2020-02-27 ENCOUNTER — Ambulatory Visit (HOSPITAL_COMMUNITY): Payer: Medicare Other

## 2020-03-03 LAB — LIPID PANEL
Cholesterol: 190 (ref 0–200)
HDL: 79 — AB (ref 35–70)
LDL Cholesterol: 79
Triglycerides: 197 — AB (ref 40–160)

## 2020-03-06 ENCOUNTER — Encounter (HOSPITAL_COMMUNITY): Payer: Self-pay

## 2020-03-06 ENCOUNTER — Encounter (HOSPITAL_COMMUNITY): Payer: Medicare Other

## 2020-05-15 LAB — HEMOGLOBIN A1C: Hemoglobin A1C: 5.8

## 2020-05-15 LAB — BASIC METABOLIC PANEL: Glucose: 120

## 2020-05-15 LAB — TSH: TSH: 3.22 (ref <=5.90)

## 2020-06-04 LAB — BASIC METABOLIC PANEL
BUN: 20 (ref 4–21)
Creatinine: 1 (ref 0.5–1.1)

## 2020-08-18 ENCOUNTER — Other Ambulatory Visit: Payer: Self-pay

## 2020-08-18 ENCOUNTER — Encounter: Payer: Self-pay | Admitting: Adult Health

## 2020-08-18 ENCOUNTER — Encounter: Payer: Self-pay | Admitting: *Deleted

## 2020-08-18 ENCOUNTER — Ambulatory Visit (INDEPENDENT_AMBULATORY_CARE_PROVIDER_SITE_OTHER): Payer: Medicare Other

## 2020-08-18 ENCOUNTER — Ambulatory Visit (INDEPENDENT_AMBULATORY_CARE_PROVIDER_SITE_OTHER): Payer: Medicare Other | Admitting: Adult Health

## 2020-08-18 ENCOUNTER — Ambulatory Visit: Payer: Medicare Other | Admitting: Pulmonary Disease

## 2020-08-18 VITALS — BP 120/60 | HR 83 | Temp 98.1°F | Ht 59.0 in | Wt 132.8 lb

## 2020-08-18 DIAGNOSIS — J45909 Unspecified asthma, uncomplicated: Secondary | ICD-10-CM

## 2020-08-18 DIAGNOSIS — J309 Allergic rhinitis, unspecified: Secondary | ICD-10-CM | POA: Diagnosis not present

## 2020-08-18 NOTE — Progress Notes (Signed)
@Patient  ID: Brenda Bautista, female    DOB: [birth year], 75 y.o.   MRN: [id number]  Chief Complaint  Patient presents with  . Follow-up    Referring provider: Lujean Amel, MD  HPI: 75 year old female never smoker followed for chronic cough, mild intermittent asthma, chronic rhinitis  TEST/EVENTS :  January 2019 CT chest negative for ILD 2019 Exhaled nitric oxide test normal IgE normal  08/18/2020 Follow up ; mild intermittent asthma, chronic cough Patient returns for a follow-up visit.  Last seen July 2019.  Patient complains of the last several months she has had some increased shortness of breath intermittent cough and wheezing.  Says she will do fine for a while but then will have what she describes as an attack.  Especially if she gets triggered by certain smells.. Patient says overall she is very active.  She does all her housework and cooking.  She lives alone.  Does not drive.)  Has never driven).  She denies any increased albuterol use.  She is on Symbicort twice daily.  She takes Chlor-Trimeton 8 mg 3 times a day for chronic allergy symptoms.  Does state that she does feel tired and sleepy some.  She is on Astelin nasal spray.  She is a never smoker.  She denies any chest pain orthopnea PND leg swelling or hemoptysis.  Allergies  Allergen Reactions  . Codeine   . Bactrim [Sulfamethoxazole-Trimethoprim] Rash  . Flagyl [Metronidazole] Rash  . Meloxicam Other (See Comments)    Abdominal pain     Immunization History  Administered Date(s) Administered  . Influenza Split 03/08/2015  . Influenza, High Dose Seasonal PF 03/24/2016, 03/03/2020  . Influenza-Unspecified 02/19/2017  . PFIZER(Purple Top)SARS-COV-2 Vaccination 06/30/2019, 07/21/2019, 03/13/2020  . Pneumococcal Conjugate-13 10/22/2014  . Pneumococcal Polysaccharide-23 06/24/2010  . Zoster 03/03/2020    Past Medical History:  Diagnosis Date  . Allergic rhinitis   . Anxiety   . Arthritis   . Asthma    . Cataract   . Depression   . Hyperlipemia   . Hypertension   . Sinusitis   . Vitamin D deficiency     Tobacco History: Social History   Tobacco Use  Smoking Status Never Smoker  Smokeless Tobacco Never Used   Counseling given: Not Answered   Outpatient Medications Prior to Visit  Medication Sig Dispense Refill  . albuterol (PROVENTIL HFA;VENTOLIN HFA) 108 (90 Base) MCG/ACT inhaler Inhale 2 puffs into the lungs every 6 (six) hours as needed for wheezing or shortness of breath.    . ALPRAZolam (XANAX) 0.25 MG tablet Take 0.25 mg by mouth 2 (two) times daily as needed for anxiety.    Marland Kitchen amLODipine (NORVASC) 2.5 MG tablet Take 2.5 mg by mouth daily.    Marland Kitchen aspirin 81 MG tablet Take 81 mg by mouth daily.    Marland Kitchen atorvastatin (LIPITOR) 40 MG tablet Take 1 tablet (40 mg total) by mouth daily. NEED OV. 15 tablet 0  . azelastine (ASTELIN) 0.1 % nasal spray Place 2 sprays into both nostrils 2 (two) times daily. Use in each nostril as directed 30 mL 12  . calcium carbonate 1250 MG capsule Take 1,250 mg by mouth daily.    . carboxymethylcellulose (REFRESH PLUS) 0.5 % SOLN Place 1 drop into both eyes at bedtime.    . chlorpheniramine (CHLOR-TRIMETON) 4 MG tablet Take 4 mg by mouth 2 (two) times daily.    . cholecalciferol (VITAMIN D) 1000 units tablet Take 1,000 Units by mouth daily.    Marland Kitchen  diphenhydrAMINE (BENADRYL) 25 MG tablet Take 25 mg by mouth every 6 (six) hours as needed.    Marland Kitchen FLUoxetine (PROZAC) 40 MG capsule Take 40 mg by mouth daily.    . fluticasone (FLONASE) 50 MCG/ACT nasal spray PLACE 2 SPRAYS INTO BOTH NOSTRILS DAILY. 2 g 1  . HYDROcodone-acetaminophen (NORCO/VICODIN) 5-325 MG tablet Take 2 tablets by mouth every 4 (four) hours as needed. 10 tablet 0  . meclizine (ANTIVERT) 25 MG tablet Take 25 mg by mouth 3 (three) times daily as needed for dizziness.    . montelukast (SINGULAIR) 10 MG tablet TAKE 1 TABLET BY MOUTH EVERY DAY 30 tablet 0  . Multiple Vitamins-Minerals (MULTIVITAMIN  WITH MINERALS) tablet Take 1 tablet by mouth daily.    Marland Kitchen omeprazole (PRILOSEC) 40 MG capsule Take 1 capsule (40 mg total) by mouth 2 (two) times daily. 60 capsule 2  . omeprazole (PRILOSEC) 40 MG capsule TAKE 1 CAPSULE BY MOUTH TWICE A DAY 180 capsule 0  . Probiotic Product (PROBIOTIC ADVANCED PO) Take 1 capsule by mouth daily.    . budesonide-formoterol (SYMBICORT) 80-4.5 MCG/ACT inhaler Symbicort 80 mcg-4.5 mcg/actuation HFA aerosol inhaler     No facility-administered medications prior to visit.     Review of Systems:   Constitutional:   No  weight loss, night sweats,  Fevers, chills, fatigue, or  lassitude.  HEENT:   No headaches,  Difficulty swallowing,  Tooth/dental problems, or  Sore throat,                No sneezing, itching, ear ache,  +nasal congestion, post nasal drip,   CV:  No chest pain,  Orthopnea, PND, swelling in lower extremities, anasarca, dizziness, palpitations, syncope.   GI  No heartburn, indigestion, abdominal pain, nausea, vomiting, diarrhea, change in bowel habits, loss of appetite, bloody stools.   Resp:   No chest wall deformity  Skin: no rash or lesions.  GU: no dysuria, change in color of urine, no urgency or frequency.  No flank pain, no hematuria   MS:  No joint pain or swelling.  No decreased range of motion.  No back pain.    Physical Exam  BP 120/60 (BP Location: Left Arm, Patient Position: Sitting, Cuff Size: Normal)   Pulse 83   Temp 98.1 F (36.7 C) (Temporal)   Ht 4\' 11"  (1.499 m)   Wt 132 lb 12.8 oz (60.2 kg)   SpO2 97%   BMI 26.82 kg/m   GEN: A/Ox3; pleasant , NAD, well nourished    HEENT:  [place]/AT,   NOSE-clear, THROAT-clear, no lesions, no postnasal drip or exudate noted.   NECK:  Supple w/ fair ROM; no JVD; normal carotid impulses w/o bruits; no thyromegaly or nodules palpated; no lymphadenopathy.    RESP  Clear  P & A; w/o, wheezes/ rales/ or rhonchi. no accessory muscle use, no dullness to percussion  CARD:  RRR, no  m/r/g, no peripheral edema, pulses intact, no cyanosis or clubbing.  GI:   Soft & nt; nml bowel sounds; no organomegaly or masses detected.   Musco: Warm bil, no deformities or joint swelling noted.   Neuro: alert, no focal deficits noted.    Skin: Warm, no lesions or rashes    Lab Results:   ProBNP No results found for: PROBNP  Imaging: No results found.    PFT Results Latest Ref Rng & Units 03/24/2016  FVC-Pre L 2.03  FVC-Predicted Pre % 87  FVC-Post L 1.92  FVC-Predicted Post % 82  Pre FEV1/FVC % % 87  Post FEV1/FCV % % 89  FEV1-Pre L 1.77  FEV1-Predicted Pre % 102  FEV1-Post L 1.71  DLCO uncorrected ml/min/mmHg 10.75  DLCO UNC% % 61  DLVA Predicted % 102  TLC L 3.24  TLC % Predicted % 75  RV % Predicted % 62    Lab Results  Component Value Date   NITRICOXIDE 14 03/24/2016        Assessment & Plan:   Asthma Mild persistent asthma with increased symptom burden.  Suspect chronic rhinitis as a trigger. We will change Chlor-Trimeton to 8 mg at bedtime.  Add in [place] daily. Check PFTs on return.  Check chest x-ray today.  Previous PFTs that showed normal lung function with no airflow obstruction or restriction.  For now we will continue on Symbicort twice daily.  Plan  Patient Instructions  Change Chlor tabs 4mg  2 At bedtime  .  Begin Allegra 180mg  daily  Continue on Montelukast 10mg  At bedtime .  Continue on Astelin nasal 1 puff Twice daily    Continue on Symbicort 2 puffs Twice daily, brush/rinse/gargle after use. Eat yogurt daily  Albuterol Inhaler As needed    Activity as tolerated.  Chest xray today   Follow up with Dr Vaughan Browner in 6-8 weeks with PFT Please contact office for sooner follow up if symptoms do not improve or worsen or seek emergency care       Allergic rhinitis Add Allegra daily and change Chlor-Trimeton to 8 mg at bedtime continue on Astelin and Singulair  Plan  Patient Instructions  Change Chlor tabs 4mg  2 At bedtime   .  Begin Allegra 180mg  daily  Continue on Montelukast 10mg  At bedtime .  Continue on Astelin nasal 1 puff Twice daily    Continue on Symbicort 2 puffs Twice daily, brush/rinse/gargle after use. Eat yogurt daily  Albuterol Inhaler As needed    Activity as tolerated.  Chest xray today   Follow up with Dr Vaughan Browner in 6-8 weeks with PFT Please contact office for sooner follow up if symptoms do not improve or worsen or seek emergency care          Rexene Edison, NP 08/18/2020

## 2020-08-18 NOTE — Assessment & Plan Note (Signed)
Add Allegra daily and change Chlor-Trimeton to 8 mg at bedtime continue on Astelin and Singulair  Plan  Patient Instructions  Change Chlor tabs 4mg  2 At bedtime  .  Begin Allegra 180mg  daily  Continue on Montelukast 10mg  At bedtime .  Continue on Astelin nasal 1 puff Twice daily    Continue on Symbicort 2 puffs Twice daily, brush/rinse/gargle after use. Eat yogurt daily  Albuterol Inhaler As needed    Activity as tolerated.  Chest xray today   Follow up with Dr Vaughan Browner in 6-8 weeks with PFT Please contact office for sooner follow up if symptoms do not improve or worsen or seek emergency care

## 2020-08-18 NOTE — Assessment & Plan Note (Signed)
Mild persistent asthma with increased symptom burden.  Suspect chronic rhinitis as a trigger. We will change Chlor-Trimeton to 8 mg at bedtime.  Add in [place] daily. Check PFTs on return.  Check chest x-ray today.  Previous PFTs that showed normal lung function with no airflow obstruction or restriction.  For now we will continue on Symbicort twice daily.  Plan  Patient Instructions  Change Chlor tabs 4mg  2 At bedtime  .  Begin Allegra 180mg  daily  Continue on Montelukast 10mg  At bedtime .  Continue on Astelin nasal 1 puff Twice daily    Continue on Symbicort 2 puffs Twice daily, brush/rinse/gargle after use. Eat yogurt daily  Albuterol Inhaler As needed    Activity as tolerated.  Chest xray today   Follow up with Dr Vaughan Browner in 6-8 weeks with PFT Please contact office for sooner follow up if symptoms do not improve or worsen or seek emergency care

## 2020-08-18 NOTE — Patient Instructions (Signed)
Change Chlor tabs 4mg  2 At bedtime  .  Begin Allegra 180mg  daily  Continue on Montelukast 10mg  At bedtime .  Continue on Astelin nasal 1 puff Twice daily    Continue on Symbicort 2 puffs Twice daily, brush/rinse/gargle after use. Eat yogurt daily  Albuterol Inhaler As needed    Activity as tolerated.  Chest xray today   Follow up with Dr Vaughan Browner in 6-8 weeks with PFT Please contact office for sooner follow up if symptoms do not improve or worsen or seek emergency care

## 2020-09-13 IMAGING — CT CT ABD-PELV W/O CM
1 of 2 series · 14 of 32 positions shown, 19 images · non-contrast
Comparison: None.

CLINICAL DATA: RIGHT flank pain for 2 weeks

EXAM:
CT ABDOMEN AND PELVIS WITHOUT CONTRAST
TECHNIQUE: Multidetector CT imaging of the abdomen and pelvis was performed
following the standard protocol without IV contrast.

[Series 2: abd/pelvis w/(date) · axial · 0.81mm/px · z∈[-566,-221]mm · 14 of 77 slices shown, 19 images]
[im 4/77  soft-tissue]
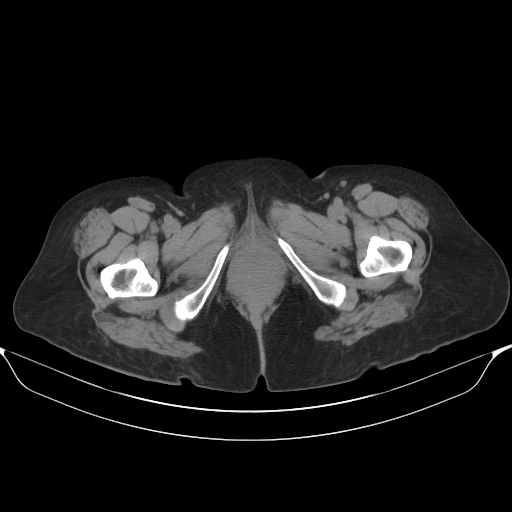
[im 4/77  bone]
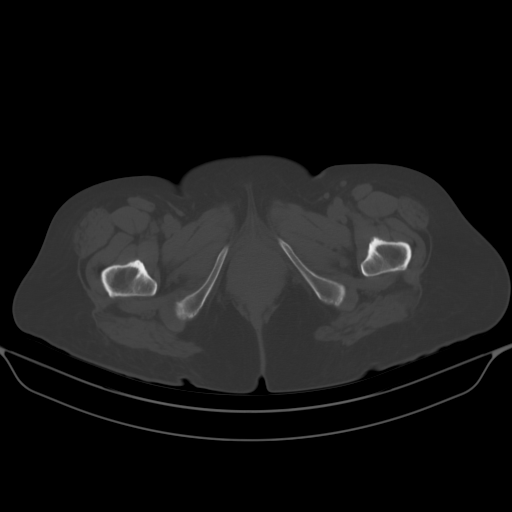
[im 12/77  soft-tissue]
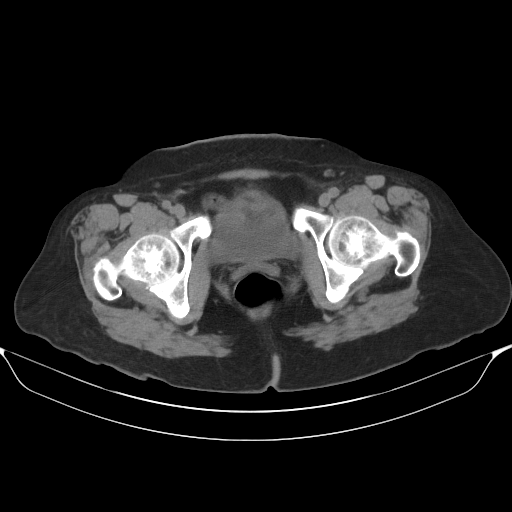
[im 16/77  soft-tissue]
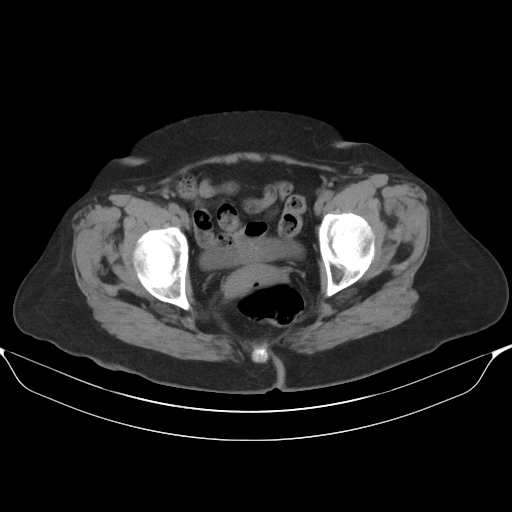
[im 23/77  soft-tissue]
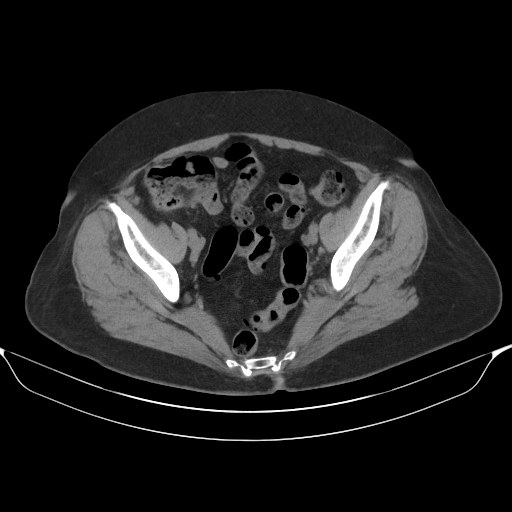
[im 27/77  soft-tissue]
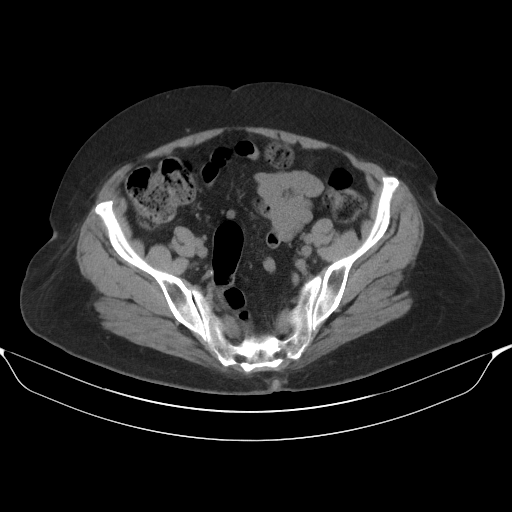
[im 35/77  soft-tissue]
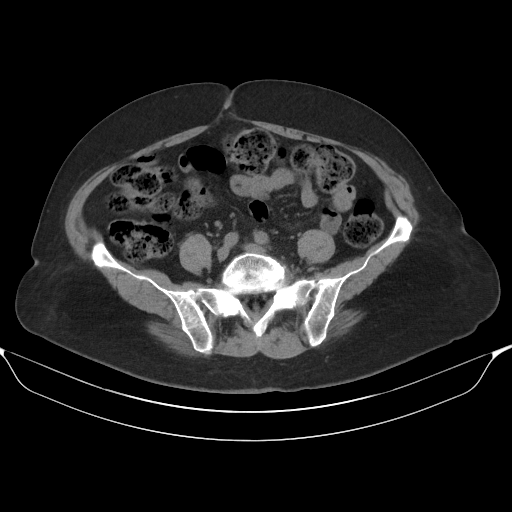
[im 39/77  soft-tissue]
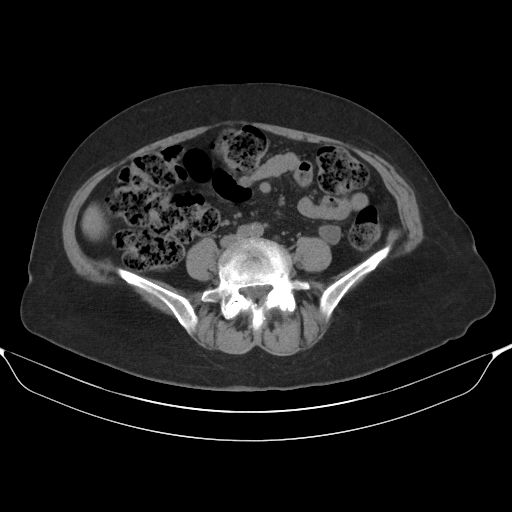
[im 42/77  soft-tissue]
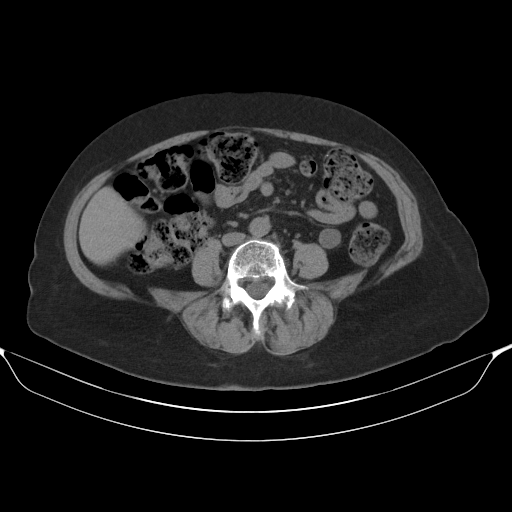
[im 50/77  soft-tissue]
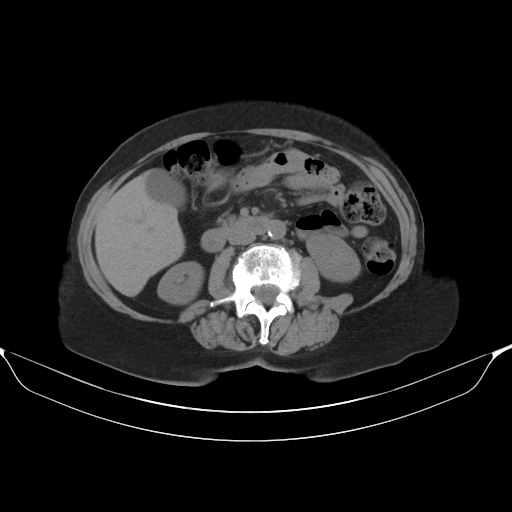
[im 50/77  bone]
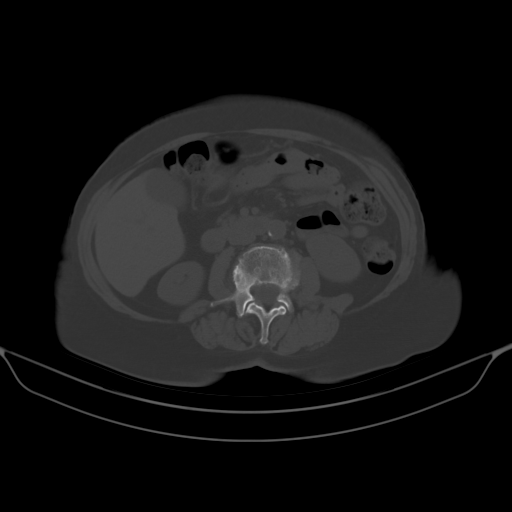
[im 54/77  soft-tissue]
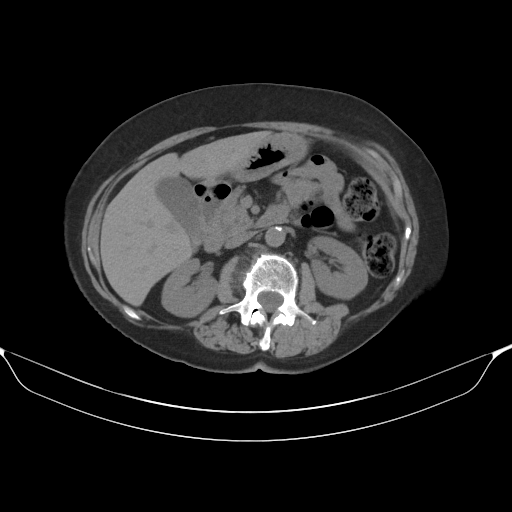
[im 61/77  soft-tissue]
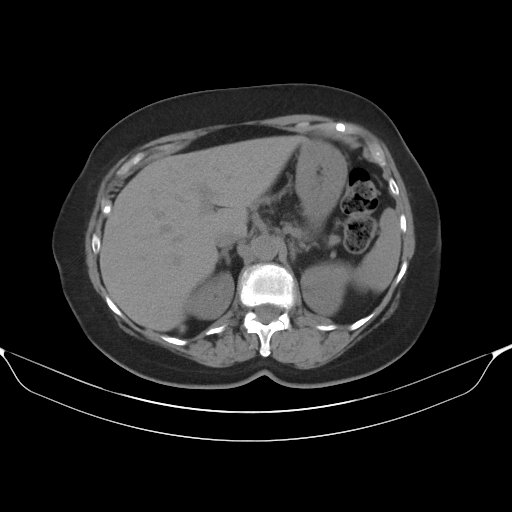
[im 61/77  lung]
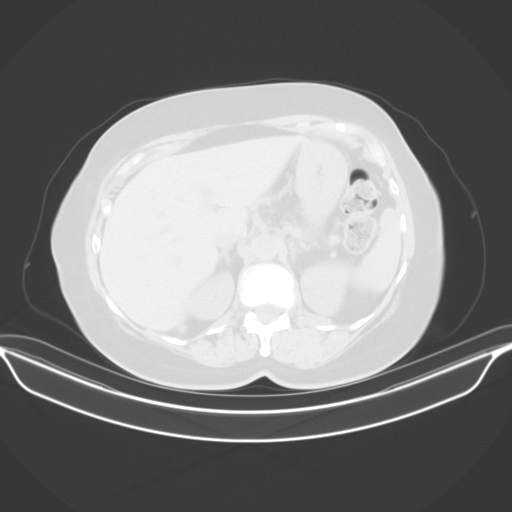
[im 65/77  soft-tissue]
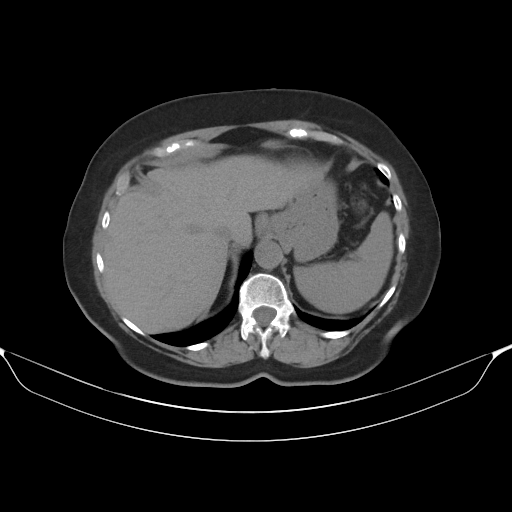
[im 65/77  lung]
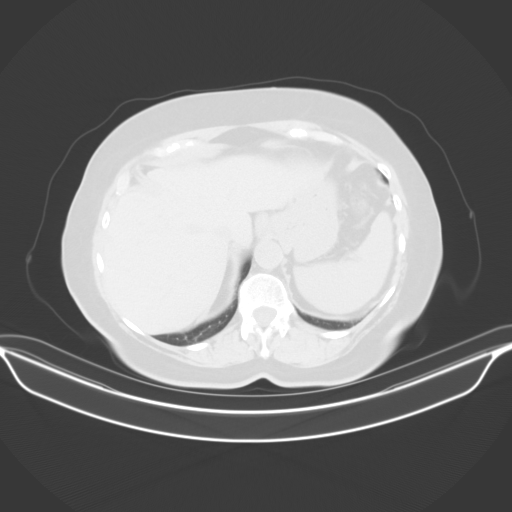
[im 69/77  lung]
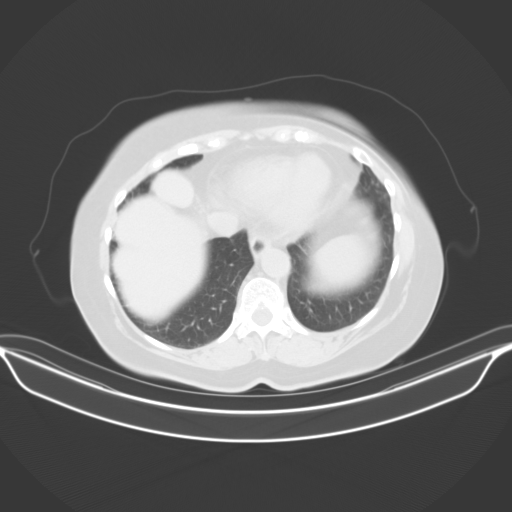
[im 73/77  soft-tissue]
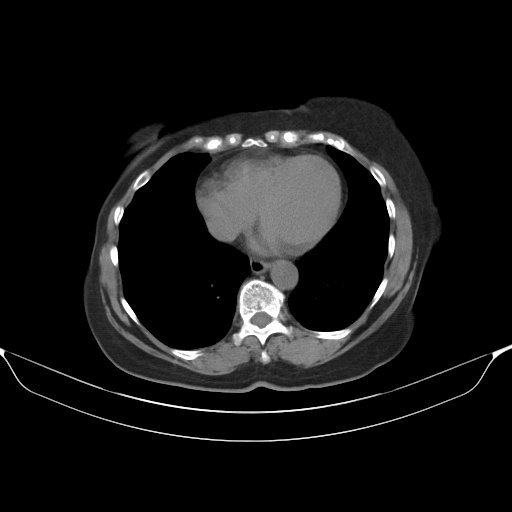
[im 73/77  lung]
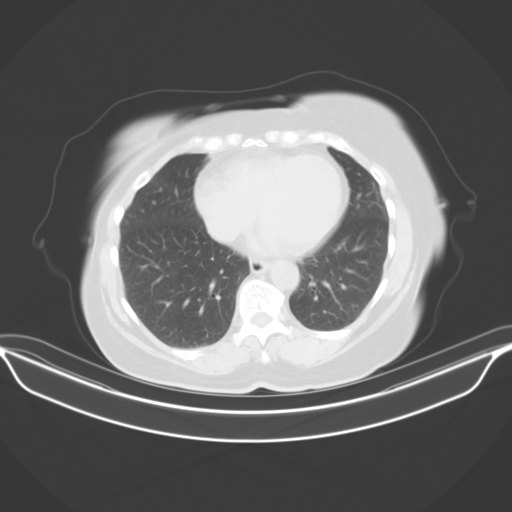

[14 of 32 positions shown; findings below may reference images not displayed]

FINDINGS: Lower chest: Lung bases are clear.

Hepatobiliary: No focal hepatic lesion. No biliary duct dilatation.
Gallbladder is normal. Common bile duct is normal.

Pancreas: Pancreas is normal. No ductal dilatation. No pancreatic
inflammation.

Spleen: Normal spleen

Adrenals/urinary tract: Adrenal glands and kidneys are normal. The
ureters and bladder normal.

Stomach/Bowel: Stomach, duodenum small-bowel normal. Post
appendectomy. Moderate volume stool throughout the colon. No colon
inflammation. Rectum normal.

Vascular/Lymphatic: Abdominal aorta is normal caliber. No periportal
or retroperitoneal adenopathy. No pelvic adenopathy.

Reproductive: Uterus and adnexa unremarkable.

Other: No free fluid.

Musculoskeletal: No aggressive osseous lesion. Multiple levels of
disc space narrowing. Mild anterolisthesis of L4 on L5.
IMPRESSION: 1. No acute findings in the abdomen pelvis.
2. No nephrolithiasis, ureterolithiasis or obstructive uropathy.
3. No diverticulitis.
4. Degenerative change of the lumbar spine

## 2020-09-17 ENCOUNTER — Ambulatory Visit (INDEPENDENT_AMBULATORY_CARE_PROVIDER_SITE_OTHER): Payer: Medicare Other | Admitting: Internal Medicine

## 2020-09-17 ENCOUNTER — Encounter: Payer: Self-pay | Admitting: Internal Medicine

## 2020-09-17 ENCOUNTER — Other Ambulatory Visit: Payer: Self-pay

## 2020-09-17 VITALS — BP 128/66 | HR 64 | Temp 98.4°F | Ht 59.0 in | Wt 134.8 lb

## 2020-09-17 DIAGNOSIS — Z23 Encounter for immunization: Secondary | ICD-10-CM | POA: Diagnosis not present

## 2020-09-17 DIAGNOSIS — J4520 Mild intermittent asthma, uncomplicated: Secondary | ICD-10-CM | POA: Diagnosis not present

## 2020-09-17 DIAGNOSIS — I10 Essential (primary) hypertension: Secondary | ICD-10-CM | POA: Diagnosis not present

## 2020-09-17 DIAGNOSIS — Z1231 Encounter for screening mammogram for malignant neoplasm of breast: Secondary | ICD-10-CM

## 2020-09-17 MED ORDER — BOOSTRIX 5-2.5-18.5 LF-MCG/0.5 IM SUSP: 0.5000 mL | Freq: Once | INTRAMUSCULAR | 0 refills | Status: AC

## 2020-09-17 NOTE — Patient Instructions (Signed)

## 2020-09-17 NOTE — Progress Notes (Signed)
Subjective:  Patient ID: Brenda Bautista, female    DOB: [birth year]  Age: 75 y.o. MRN: [id number]  CC: New Patient (Initial Visit) (physical)  This visit occurred during the SARS-CoV-2 public health emergency.  Safety protocols were in place, including screening questions prior to the visit, additional usage of staff PPE, and extensive cleaning of exam room while observing appropriate contact time as indicated for disinfecting solutions.    HPI Brenda Bautista presents for establishing.  She tells me she wants a new primary care doctor because she saw her previous PCP about 3 months ago and wanted a pneumonia vaccine but after what she describes a several request she has not received a pneumonia vaccine.  She tells me that her labs were okay about 3 months ago.  She has a history of hypertension and asthma.  She tells me that both disease states are well controlled.  She denies chest pain, dyspnea on exertion, cough, wheezing, palpitations, edema, or fatigue.  History Brenda Bautista has a past medical history of Allergic rhinitis, Anxiety, Arthritis, Asthma, Cataract, Chicken pox, Depression, Hyperlipemia, Hypertension, Sinusitis, and Vitamin D deficiency.   She has a past surgical history that includes Cardiac catheterization (N/A, 12/19/2015); Cataract extraction, bilateral (Right, 06/2015); Cataract extraction, bilateral (Left, 07/2015); Trigger finger release; and Wisdom tooth extraction.   Her family history includes Asthma in her father; Breast cancer in her daughter; COPD in her brother and father; Coronary artery disease (age of onset: 44) in her mother; Depression in her mother; Emphysema in her father; Hearing loss in her daughter; Heart attack in her father and mother; Hyperlipidemia in her brother, father, and mother; Hypertension in her brother, father, and mother; Mitral valve prolapse in her son; Sick sinus syndrome in her mother; Stroke in her mother.She reports that she has  never smoked. She has never used smokeless tobacco. She reports that she does not drink alcohol and does not use drugs.  Outpatient Medications Prior to Visit  Medication Sig Dispense Refill  . albuterol (PROVENTIL HFA;VENTOLIN HFA) 108 (90 Base) MCG/ACT inhaler Inhale 2 puffs into the lungs every 6 (six) hours as needed for wheezing or shortness of breath.    . ALPRAZolam (XANAX) 0.25 MG tablet Take 0.25 mg by mouth 2 (two) times daily as needed for anxiety.    Marland Kitchen amLODipine (NORVASC) 2.5 MG tablet Take 2.5 mg by mouth daily.    Marland Kitchen aspirin 81 MG tablet Take 81 mg by mouth daily.    Marland Kitchen atorvastatin (LIPITOR) 40 MG tablet Take 1 tablet (40 mg total) by mouth daily. NEED OV. 15 tablet 0  . azelastine (ASTELIN) 0.1 % nasal spray Place 2 sprays into both nostrils 2 (two) times daily. Use in each nostril as directed 30 mL 12  . budesonide-formoterol (SYMBICORT) 80-4.5 MCG/ACT inhaler Symbicort 80 mcg-4.5 mcg/actuation HFA aerosol inhaler    . calcium carbonate 1250 MG capsule Take 1,250 mg by mouth daily.    . carboxymethylcellulose (REFRESH PLUS) 0.5 % SOLN Place 1 drop into both eyes at bedtime.    . chlorpheniramine (CHLOR-TRIMETON) 4 MG tablet Take 4 mg by mouth 2 (two) times daily.    . cholecalciferol (VITAMIN D) 1000 units tablet Take 1,000 Units by mouth daily.    . diphenhydrAMINE (BENADRYL) 25 MG tablet Take 25 mg by mouth every 6 (six) hours as needed.    Marland Kitchen FLUoxetine (PROZAC) 40 MG capsule Take 40 mg by mouth daily.    . fluticasone (FLONASE) 50 MCG/ACT nasal spray PLACE  2 SPRAYS INTO BOTH NOSTRILS DAILY. 2 g 1  . HYDROcodone-acetaminophen (NORCO/VICODIN) 5-325 MG tablet Take 2 tablets by mouth every 4 (four) hours as needed. 10 tablet 0  . meclizine (ANTIVERT) 25 MG tablet Take 25 mg by mouth 3 (three) times daily as needed for dizziness.    . montelukast (SINGULAIR) 10 MG tablet TAKE 1 TABLET BY MOUTH EVERY DAY 30 tablet 0  . Multiple Vitamins-Minerals (MULTIVITAMIN WITH MINERALS) tablet  Take 1 tablet by mouth daily.    Marland Kitchen omeprazole (PRILOSEC) 40 MG capsule Take 1 capsule (40 mg total) by mouth 2 (two) times daily. 60 capsule 2  . omeprazole (PRILOSEC) 40 MG capsule TAKE 1 CAPSULE BY MOUTH TWICE A DAY 180 capsule 0  . Probiotic Product (PROBIOTIC ADVANCED PO) Take 1 capsule by mouth daily.     No facility-administered medications prior to visit.    ROS Review of Systems  Constitutional: Negative.  Negative for appetite change, chills, diaphoresis and fatigue.  HENT: Negative.   Eyes: Negative for visual disturbance.  Respiratory: Negative for cough, chest tightness, shortness of breath and wheezing.   Cardiovascular: Negative for chest pain, palpitations and leg swelling.  Gastrointestinal: Negative for abdominal pain, constipation, diarrhea, nausea and vomiting.  Endocrine: Negative.   Genitourinary: Negative.  Negative for difficulty urinating.  Musculoskeletal: Negative for arthralgias, joint swelling and myalgias.  Skin: Negative.  Negative for color change.  Neurological: Negative.  Negative for dizziness and weakness.  Hematological: Negative for adenopathy. Does not bruise/bleed easily.  Psychiatric/Behavioral: Negative.     Objective:  BP 128/66 (BP Location: Right Arm, Patient Position: Sitting, Cuff Size: Large)   Pulse 64   Temp 98.4 F (36.9 C) (Oral)   Ht 4\' 11"  (1.499 m)   Wt 134 lb 12.8 oz (61.1 kg)   SpO2 97%   BMI 27.23 kg/m   Physical Exam Vitals reviewed.  Constitutional:      Appearance: Normal appearance.  HENT:     Nose: Nose normal.     Mouth/Throat:     Pharynx: Oropharynx is clear.  Eyes:     General: No scleral icterus.    Conjunctiva/sclera: Conjunctivae normal.  Cardiovascular:     Rate and Rhythm: Normal rate and regular rhythm.     Heart sounds: No murmur heard.   Pulmonary:     Effort: Pulmonary effort is normal.     Breath sounds: No stridor. No wheezing, rhonchi or rales.  Abdominal:     General: Abdomen is  flat.     Palpations: There is no mass.     Tenderness: There is no abdominal tenderness. There is no guarding.  Musculoskeletal:        General: Normal range of motion.     Cervical back: Neck supple.     Right lower leg: No edema.     Left lower leg: No edema.  Lymphadenopathy:     Cervical: No cervical adenopathy.  Skin:    General: Skin is warm and dry.  Neurological:     General: No focal deficit present.     Mental Status: She is alert and oriented to person, place, and time.  Psychiatric:        Mood and Affect: Mood normal.        Behavior: Behavior normal.     Lab Results  Component Value Date   WBC 9.4 06/20/2017   HGB 12.5 06/20/2017   HCT 37.4 06/20/2017   PLT 316.0 06/20/2017   GLUCOSE 164 (H)  12/18/2015   CHOL 190 03/03/2020   TRIG 197 (A) 03/03/2020   HDL 79 (A) 03/03/2020   LDLCALC 79 03/03/2020   NA 132 (L) 12/18/2015   K 4.0 12/18/2015   CL 99 (L) 12/18/2015   CREATININE 1.0 06/04/2020   BUN 20 06/04/2020   CO2 20 (L) 12/18/2015   TSH 3.22 05/15/2020   INR 1.15 12/18/2015   HGBA1C 5.8 05/15/2020    Assessment & Plan:   Brenda Bautista was seen today for new patient (initial visit).  Diagnoses and all orders for this visit:  Essential hypertension- Her blood pressure is adequately well controlled.  Mild intermittent asthma without complication- She is doing well on the ICS/LABA inhaler.  Will continue.  Need for prophylactic vaccination with combined diphtheria-tetanus-pertussis (DTP) vaccine -     Tdap (BOOSTRIX) 5-2.5-18.5 LF-MCG/0.5 injection; Inject 0.5 mLs into the muscle once for 1 dose.  Visit for screening mammogram -     MM DIGITAL SCREENING BILATERAL; Future  Other orders -     Pneumococcal polysaccharide vaccine 23-valent greater than or equal to 2yo subcutaneous/IM   I am having Brenda Bautista. [place]er start on Boostrix. I am also having her maintain her diphenhydrAMINE, multivitamin with minerals, meclizine, albuterol, aspirin,  ALPRAZolam, FLUoxetine, amLODipine, carboxymethylcellulose, cholecalciferol, calcium carbonate, fluticasone, azelastine, Probiotic Product (PROBIOTIC ADVANCED PO), omeprazole, chlorpheniramine, HYDROcodone-acetaminophen, atorvastatin, omeprazole, montelukast, and budesonide-formoterol.  Meds ordered this encounter  Medications  . Tdap (BOOSTRIX) 5-2.5-18.5 LF-MCG/0.5 injection    Sig: Inject 0.5 mLs into the muscle once for 1 dose.    Dispense:  0.5 mL    Refill:  0     Follow-up: Return in about 3 months (around 12/17/2020).  Scarlette Calico, MD

## 2020-10-20 ENCOUNTER — Other Ambulatory Visit: Payer: Self-pay | Admitting: *Deleted

## 2020-10-20 DIAGNOSIS — J45909 Unspecified asthma, uncomplicated: Secondary | ICD-10-CM

## 2020-10-21 ENCOUNTER — Ambulatory Visit (INDEPENDENT_AMBULATORY_CARE_PROVIDER_SITE_OTHER): Payer: Medicare Other | Admitting: Pulmonary Disease

## 2020-10-21 ENCOUNTER — Encounter: Payer: Self-pay | Admitting: Pulmonary Disease

## 2020-10-21 ENCOUNTER — Other Ambulatory Visit: Payer: Self-pay

## 2020-10-21 VITALS — BP 122/60 | HR 61 | Temp 97.7°F | Ht 59.0 in | Wt 137.0 lb

## 2020-10-21 DIAGNOSIS — J309 Allergic rhinitis, unspecified: Secondary | ICD-10-CM

## 2020-10-21 DIAGNOSIS — J45909 Unspecified asthma, uncomplicated: Secondary | ICD-10-CM | POA: Diagnosis not present

## 2020-10-21 LAB — CBC WITH DIFFERENTIAL/PLATELET
Basophils Absolute: 0.1 10*3/uL (ref 0.0–0.1)
Basophils Relative: 1 % (ref 0.0–3.0)
Eosinophils Absolute: 0.1 10*3/uL (ref 0.0–0.7)
Eosinophils Relative: 1.6 % (ref 0.0–5.0)
HCT: 38.5 % (ref 36.0–46.0)
Hemoglobin: 13.1 g/dL (ref 12.0–15.0)
Lymphocytes Relative: 32.1 % (ref 12.0–46.0)
Lymphs Abs: 2.9 10*3/uL (ref 0.7–4.0)
MCHC: 34 g/dL (ref 30.0–36.0)
MCV: 92.6 fl (ref 78.0–100.0)
Monocytes Absolute: 0.8 10*3/uL (ref 0.1–1.0)
Monocytes Relative: 8.9 % (ref 3.0–12.0)
Neutro Abs: 5.2 10*3/uL (ref 1.4–7.7)
Neutrophils Relative %: 56.4 % (ref 43.0–77.0)
Platelets: 317 10*3/uL (ref 150.0–400.0)
RBC: 4.16 Mil/uL (ref 3.87–5.11)
RDW: 13.4 % (ref 11.5–15.5)
WBC: 9.2 10*3/uL (ref 4.0–10.5)

## 2020-10-21 LAB — PULMONARY FUNCTION TEST
DL/VA % pred: 105 %
DL/VA: 4.49 ml/min/mmHg/L
DLCO cor % pred: 85 %
DLCO cor: 13.65 ml/min/mmHg
DLCO unc % pred: 85 %
DLCO unc: 13.65 ml/min/mmHg
FEF 25-75 Post: 2.46 L/sec
FEF 25-75 Pre: 1.66 L/sec
FEF2575-%Change-Post: 48 %
FEF2575-%Pred-Post: 191 %
FEF2575-%Pred-Pre: 129 %
FEV1-%Change-Post: 9 %
FEV1-%Pred-Post: 103 %
FEV1-%Pred-Pre: 95 %
FEV1-Post: 1.65 L
FEV1-Pre: 1.51 L
FEV1FVC-%Change-Post: 9 %
FEV1FVC-%Pred-Pre: 113 %
FEV6-%Change-Post: 0 %
FEV6-%Pred-Post: 87 %
FEV6-%Pred-Pre: 87 %
FEV6-Post: 1.77 L
FEV6-Pre: 1.78 L
FEV6FVC-%Pred-Post: 106 %
FEV6FVC-%Pred-Pre: 106 %
FVC-%Change-Post: 0 %
FVC-%Pred-Post: 82 %
FVC-%Pred-Pre: 82 %
FVC-Post: 1.77 L
FVC-Pre: 1.78 L
Post FEV1/FVC ratio: 93 %
Post FEV6/FVC ratio: 100 %
Pre FEV1/FVC ratio: 85 %
Pre FEV6/FVC Ratio: 100 %
RV % pred: 58 %
RV: 1.22 L
TLC % pred: 73 %
TLC: 3.18 L

## 2020-10-21 NOTE — Patient Instructions (Addendum)
Full PFT performed today. °

## 2020-10-21 NOTE — Progress Notes (Signed)
Full PFT performed today. °

## 2020-10-21 NOTE — Progress Notes (Signed)
Brenda Bautista    [id number]    July 15, 1945  Primary Care Physician:Jones, Arvid Right, MD  Referring Physician: Lujean Amel, MD [place],  [place] [address]  Chief complaint:  Follow up for  Upper airway cough syndrome Post nasal drip GERD  HPI: Brenda Bautista is a 75 year old with past medical history of asthma.  She has a chief complaint is dyspnea on rest and exertion associated with occasional wheeze. She has daily cough, nonproductive in nature. She has significant issues with acid reflux and is currently on Prilosec 40 a day. She was diagnosed with asthma many years ago. She has seasonal allergies which is worse in spring and fall and reports sensitivity to tree pollen, dust. She does not have any pets at home, no mold issues.  She has been evaluated by cardiology for atypical chest pain. She is also noted to have mildly elevated ANA and has been evaluated by rheumatology. There is no evidence of ILD on previous imaging. She has had recurrent episodes of sinusitis, upper respiratory tract infection requiring antibiotics and prednisone.  Symptoms are mostly confined to upper airway she does not significant symptoms of cough, sputum production, dyspnea or wheezing.. She sings in a church choir and notices hoarseness, shortness of breath with wheezing at times while singing.  Seen by ENT at [facility] in January 2019 with sinus CT which is negative.  Interim history: Last seen by me in 2019.  At that time we had stopped the Symbicort as it was felt she did not need controller medication She got restarted on it this year by primary care due to intermittent episodes of dyspnea with wheezing She is using 80/4.51 puff twice daily.  Reports that breathing is doing well with no issues  Outpatient Encounter Medications as of 10/21/2020  Medication Sig  . albuterol (PROVENTIL HFA;VENTOLIN HFA) 108 (90 Base) MCG/ACT inhaler Inhale 2 puffs into  the lungs every 6 (six) hours as needed for wheezing or shortness of breath.  . ALPRAZolam (XANAX) 0.25 MG tablet Take 0.25 mg by mouth 2 (two) times daily as needed for anxiety.  Marland Kitchen amLODipine (NORVASC) 2.5 MG tablet Take 2.5 mg by mouth daily.  Marland Kitchen aspirin 81 MG tablet Take 81 mg by mouth daily.  Marland Kitchen atorvastatin (LIPITOR) 40 MG tablet Take 1 tablet (40 mg total) by mouth daily. NEED OV.  Marland Kitchen azelastine (ASTELIN) 0.1 % nasal spray Place 2 sprays into both nostrils 2 (two) times daily. Use in each nostril as directed  . budesonide-formoterol (SYMBICORT) 80-4.5 MCG/ACT inhaler Symbicort 80 mcg-4.5 mcg/actuation HFA aerosol inhaler  . calcium carbonate 1250 MG capsule Take 1,250 mg by mouth daily.  . carboxymethylcellulose (REFRESH PLUS) 0.5 % SOLN Place 1 drop into both eyes at bedtime.  . chlorpheniramine (CHLOR-TRIMETON) 4 MG tablet Take 4 mg by mouth 2 (two) times daily.  . cholecalciferol (VITAMIN D) 1000 units tablet Take 1,000 Units by mouth daily.  . diphenhydrAMINE (BENADRYL) 25 MG tablet Take 25 mg by mouth every 6 (six) hours as needed.  Marland Kitchen FLUoxetine (PROZAC) 40 MG capsule Take 40 mg by mouth daily.  . fluticasone (FLONASE) 50 MCG/ACT nasal spray PLACE 2 SPRAYS INTO BOTH NOSTRILS DAILY.  Marland Kitchen HYDROcodone-acetaminophen (NORCO/VICODIN) 5-325 MG tablet Take 2 tablets by mouth every 4 (four) hours as needed.  . meclizine (ANTIVERT) 25 MG tablet Take 25 mg by mouth 3 (three) times daily as needed for dizziness.  . montelukast (SINGULAIR)  10 MG tablet TAKE 1 TABLET BY MOUTH EVERY DAY  . Multiple Vitamins-Minerals (MULTIVITAMIN WITH MINERALS) tablet Take 1 tablet by mouth daily.  Marland Kitchen omeprazole (PRILOSEC) 40 MG capsule Take 1 capsule (40 mg total) by mouth 2 (two) times daily.  Marland Kitchen omeprazole (PRILOSEC) 40 MG capsule TAKE 1 CAPSULE BY MOUTH TWICE A DAY  . Probiotic Product (PROBIOTIC ADVANCED PO) Take 1 capsule by mouth daily.   No facility-administered encounter medications on file as of 10/21/2020.    Physical Exam: Blood pressure 122/60, pulse 61, temperature 97.7 F (36.5 C), temperature source Temporal, height 4\' 11"  (1.499 m), weight 137 lb (62.1 kg), SpO2 97 %. Gen:      No acute distress HEENT:  EOMI, sclera anicteric Neck:     No masses; no thyromegaly Lungs:    Clear to auscultation bilaterally; normal respiratory effort CV:         Regular rate and rhythm; no murmurs Abd:      + bowel sounds; soft, non-tender; no palpable masses, no distension Ext:    No edema; adequate peripheral perfusion Skin:      Warm and dry; no rash Neuro: alert and oriented x 3 Psych: normal mood and affect  Data Reviewed: Imaging: CT high-resolution 06/20/17-no interstitial lung disease, mild air trapping, 4 mm Bautista upper lobe nodule.  Chest x-ray 08/18/2020-clear lungs with no acute abnormality I have reviewed the images personally  CT sinuses [facility] 07/06/17- 1.No active sinusitis or sinus outflow obstruction. 2. Mild rightward nasal septal deviation and leftward spurring.  PFTs 03/24/16 FVC 1.92 [82%), FEV1 1.7 (90%), F/F 89, TLC 75%, DLCO 61%, corrected 102% Minimal restriction, moderate diffusion defect which corrects for alveolar volume  10/21/2020 FVC 1.77 [82%], FEV1 1.65 [93%], F/F 93, TLC 3.18 [93%], DLCO 13.65 [85%] Minimal restriction.  FENO 03/24/16-14  Labs Allergy profile 06/20/17- IgE less than 2, RAST panel negative ANA, CCP, rheumatoid factor negative IgG 936  Cardiac: Echo 12/19/15 Left ventricle: The cavity size was normal. Wall thickness was normal. Systolic function was normal. The estimated ejection fraction was in the range of 60% to 65%. Wall motion was normal; there were no regional wall motion abnormalities. Doppler parameters are consistent with abnormal left ventricular relaxation (grade 1 diastolic dysfunction).  Assessment:  Upper airway cough, recurrent sinusitis, GERD She has had recurrent episodes of sinusitis with upper airway infection.  Seen by ENT with negative sinus CT. Continues on singulair, Flonase nasal spray and Prilosec twice daily.    Mild intermittent asthma Resumed Symbicort this year due to intermittent episodes of dyspnea She is doing well with this inhaler.  We will continue for now.  Reassess in 6 months and determine if she can come off it regular use of Symbicort and use it as needed going forward  PFTs reviewed with no significant obstruction Check CBC differential, IgE today  CT scan in past with no evidence of interstitial lung disease.  There is a small subcentimeter pulmonary nodule which is likely benign in a non-smoker.  Plan/Recommendations: - Continue Symbicort, albuterol as needed. - CBC with differential, IgE  Marshell Garfinkel MD [facility] Pager [phone] 10/21/2020, 10:16 AM  CC: Brenda Amel, MD

## 2020-10-21 NOTE — Patient Instructions (Signed)
Continue the Symbicort for now We will check CBC differential and IgE today The PFTs look okay with no significant abnormal changes which is good news  Follow-up in 6 months

## 2020-10-22 ENCOUNTER — Other Ambulatory Visit: Payer: Self-pay | Admitting: Internal Medicine

## 2020-10-22 DIAGNOSIS — F411 Generalized anxiety disorder: Secondary | ICD-10-CM

## 2020-10-22 LAB — IGE: IgE (Immunoglobulin E), Serum: 2 kU/L (ref <=114)

## 2020-10-22 MED ORDER — ALPRAZOLAM 0.25 MG PO TABS: 0.2500 mg | ORAL_TABLET | Freq: Two times a day (BID) | ORAL | 3 refills | Status: DC | PRN

## 2020-10-22 NOTE — Telephone Encounter (Signed)
ALPRAZolam (XANAX) 0.25 MG tablet Patient also requesting this medication

## 2020-10-23 ENCOUNTER — Other Ambulatory Visit: Payer: Self-pay | Admitting: Internal Medicine

## 2020-10-27 ENCOUNTER — Telehealth: Payer: Self-pay | Admitting: Internal Medicine

## 2020-10-27 ENCOUNTER — Other Ambulatory Visit: Payer: Self-pay | Admitting: Internal Medicine

## 2020-10-27 DIAGNOSIS — K2100 Gastro-esophageal reflux disease with esophagitis, without bleeding: Secondary | ICD-10-CM

## 2020-10-27 MED ORDER — OMEPRAZOLE 40 MG PO CPDR: 40.0000 mg | DELAYED_RELEASE_CAPSULE | Freq: Every day | ORAL | 1 refills | Status: DC

## 2020-10-27 NOTE — Telephone Encounter (Signed)
Pt has been informed and expressed understanding.  

## 2020-10-27 NOTE — Telephone Encounter (Signed)
Per Epic pt should take omeprazole 1 capsule by mouth 2 times daily. Would you like her to go back taking it as directed since she is taking it every other day?

## 2020-10-27 NOTE — Telephone Encounter (Signed)
Patient called and was wondering how she should be taking omeprazole (PRILOSEC) 40 MG capsule. She said when she takes it every other day she gets sick. She can be reached at [phone]. Please advise.

## 2020-10-27 NOTE — Telephone Encounter (Signed)
Take it once a day

## 2020-11-06 ENCOUNTER — Encounter: Payer: Self-pay | Admitting: *Deleted

## 2020-12-16 ENCOUNTER — Other Ambulatory Visit: Payer: Self-pay

## 2020-12-16 ENCOUNTER — Ambulatory Visit (INDEPENDENT_AMBULATORY_CARE_PROVIDER_SITE_OTHER): Payer: Medicare Other | Admitting: Internal Medicine

## 2020-12-16 ENCOUNTER — Encounter: Payer: Self-pay | Admitting: Internal Medicine

## 2020-12-16 VITALS — BP 136/72 | HR 66 | Temp 98.0°F | Resp 16 | Ht 59.0 in | Wt 135.0 lb

## 2020-12-16 DIAGNOSIS — R42 Dizziness and giddiness: Secondary | ICD-10-CM

## 2020-12-16 DIAGNOSIS — I10 Essential (primary) hypertension: Secondary | ICD-10-CM

## 2020-12-16 MED ORDER — MECLIZINE HCL 25 MG PO TABS: 25.0000 mg | ORAL_TABLET | Freq: Three times a day (TID) | ORAL | 3 refills | Status: DC | PRN

## 2020-12-16 NOTE — Progress Notes (Signed)
Subjective:  Patient ID: Brenda Bautista, female    DOB: 1946-05-14  Age: 75 y.o. MRN: [id number]  CC: Hypertension  This visit occurred during the SARS-CoV-2 public health emergency.  Safety protocols were in place, including screening questions prior to the visit, additional usage of staff PPE, and extensive cleaning of exam room while observing appropriate contact time as indicated for disinfecting solutions.    HPI Brenda Bautista presents for f/up -  She has had intermittent vertigo for 3 years.  The symptoms are well controlled with meclizine.  She requests a refill.  She tells me her blood pressure has been well controlled.  Outpatient Medications Prior to Visit  Medication Sig Dispense Refill   albuterol (PROVENTIL HFA;VENTOLIN HFA) 108 (90 Base) MCG/ACT inhaler Inhale 2 puffs into the lungs every 6 (six) hours as needed for wheezing or shortness of breath.     ALPRAZolam (XANAX) 0.25 MG tablet Take 1 tablet (0.25 mg total) by mouth 2 (two) times daily as needed for anxiety. 60 tablet 3   amLODipine (NORVASC) 2.5 MG tablet Take 2.5 mg by mouth daily.     aspirin 81 MG tablet Take 81 mg by mouth daily.     atorvastatin (LIPITOR) 40 MG tablet TAKE 1 TABLET BY MOUTH EVERYDAY AT BEDTIME 90 tablet 1   azelastine (ASTELIN) 0.1 % nasal spray Place 2 sprays into both nostrils 2 (two) times daily. Use in each nostril as directed 30 mL 12   budesonide-formoterol (SYMBICORT) 80-4.5 MCG/ACT inhaler Symbicort 80 mcg-4.5 mcg/actuation HFA aerosol inhaler     calcium carbonate 1250 MG capsule Take 1,250 mg by mouth daily.     carboxymethylcellulose (REFRESH PLUS) 0.5 % SOLN Place 1 drop into both eyes at bedtime.     chlorpheniramine (CHLOR-TRIMETON) 4 MG tablet Take 4 mg by mouth 2 (two) times daily.     cholecalciferol (VITAMIN D) 1000 units tablet Take 1,000 Units by mouth daily.     FLUoxetine (PROZAC) 40 MG capsule Take 40 mg by mouth daily.     fluticasone (FLONASE) 50 MCG/ACT  nasal spray PLACE 2 SPRAYS INTO BOTH NOSTRILS DAILY. 2 g 1   montelukast (SINGULAIR) 10 MG tablet TAKE 1 TABLET BY MOUTH EVERY DAY 30 tablet 0   Multiple Vitamins-Minerals (MULTIVITAMIN WITH MINERALS) tablet Take 1 tablet by mouth daily.     omeprazole (PRILOSEC) 40 MG capsule Take 1 capsule (40 mg total) by mouth daily. 90 capsule 1   Probiotic Product (PROBIOTIC ADVANCED PO) Take 1 capsule by mouth daily.     meclizine (ANTIVERT) 25 MG tablet Take 25 mg by mouth 3 (three) times daily as needed for dizziness.     No facility-administered medications prior to visit.    ROS Review of Systems  Constitutional:  Negative for diaphoresis and fatigue.  HENT: Negative.    Eyes: Negative.   Respiratory:  Negative for cough, chest tightness, shortness of breath and wheezing.   Cardiovascular:  Negative for chest pain, palpitations and leg swelling.  Gastrointestinal:  Negative for abdominal pain, diarrhea, nausea and vomiting.  Endocrine: Negative.   Genitourinary: Negative.  Negative for difficulty urinating.  Musculoskeletal: Negative.  Negative for back pain and myalgias.  Skin: Negative.   Neurological:  Positive for dizziness. Negative for weakness and light-headedness.   Objective:  BP 136/72 (BP Location: Left Arm, Patient Position: Sitting, Cuff Size: Large)   Pulse 66   Temp 98 F (36.7 C) (Oral)   Resp 16   Ht 4'  11" (1.499 m)   Wt 135 lb (61.2 kg)   SpO2 95%   BMI 27.27 kg/m   BP Readings from Last 3 Encounters:  12/16/20 136/72  10/21/20 122/60  09/17/20 128/66    Wt Readings from Last 3 Encounters:  12/16/20 135 lb (61.2 kg)  10/21/20 137 lb (62.1 kg)  09/17/20 134 lb 12.8 oz (61.1 kg)    Physical Exam Vitals reviewed.  Constitutional:      Appearance: Normal appearance.  HENT:     Nose: Nose normal.     Mouth/Throat:     Mouth: Mucous membranes are moist.  Eyes:     General: No scleral icterus.    Conjunctiva/sclera: Conjunctivae normal.   Cardiovascular:     Rate and Rhythm: Normal rate and regular rhythm.     Heart sounds: No murmur heard. Pulmonary:     Effort: Pulmonary effort is normal.     Breath sounds: No stridor. No wheezing, rhonchi or rales.  Abdominal:     General: Abdomen is flat. Bowel sounds are normal. There is no distension.     Palpations: Abdomen is soft. There is no hepatomegaly, splenomegaly or mass.  Musculoskeletal:        General: Normal range of motion.     Cervical back: Neck supple.     Right lower leg: No edema.     Left lower leg: No edema.  Lymphadenopathy:     Cervical: No cervical adenopathy.  Skin:    General: Skin is warm and dry.  Neurological:     General: No focal deficit present.     Mental Status: She is alert. Mental status is at baseline.  Psychiatric:        Mood and Affect: Mood normal.        Behavior: Behavior normal.    Lab Results  Component Value Date   WBC 9.2 10/21/2020   HGB 13.1 10/21/2020   HCT 38.5 10/21/2020   PLT 317.0 10/21/2020   GLUCOSE 164 (H) 12/18/2015   CHOL 190 03/03/2020   TRIG 197 (A) 03/03/2020   HDL 79 (A) 03/03/2020   LDLCALC 79 03/03/2020   NA 132 (L) 12/18/2015   K 4.0 12/18/2015   CL 99 (L) 12/18/2015   CREATININE 1.0 06/04/2020   BUN 20 06/04/2020   CO2 20 (L) 12/18/2015   TSH 3.22 05/15/2020   INR 1.15 12/18/2015   HGBA1C 5.8 05/15/2020    CT ABDOMEN PELVIS WO CONTRAST  Result Date: 11/14/2019 CLINICAL DATA:  RIGHT flank pain for 2 weeks EXAM: CT ABDOMEN AND PELVIS WITHOUT CONTRAST TECHNIQUE: Multidetector CT imaging of the abdomen and pelvis was performed following the standard protocol without IV contrast. COMPARISON:  None. FINDINGS: Lower chest: Lung bases are clear. Hepatobiliary: No focal hepatic lesion. No biliary duct dilatation. Gallbladder is normal. Common bile duct is normal. Pancreas: Pancreas is normal. No ductal dilatation. No pancreatic inflammation. Spleen: Normal spleen Adrenals/urinary tract: Adrenal glands  and kidneys are normal. The ureters and bladder normal. Stomach/Bowel: Stomach, duodenum small-bowel normal. Post appendectomy. Moderate volume stool throughout the colon. No colon inflammation. Rectum normal. Vascular/Lymphatic: Abdominal aorta is normal caliber. No periportal or retroperitoneal adenopathy. No pelvic adenopathy. Reproductive: Uterus and adnexa unremarkable. Other: No free fluid. Musculoskeletal: No aggressive osseous lesion. Multiple levels of disc space narrowing. Mild anterolisthesis of L4 on L5. IMPRESSION: 1. No acute findings in the abdomen pelvis. 2. No nephrolithiasis, ureterolithiasis or obstructive uropathy. 3. No diverticulitis. 4. Degenerative change of the lumbar spine  Electronically Signed   By: Suzy Bouchard M.D.   On: 11/14/2019 17:32    Assessment & Plan:   Shekela was seen today for hypertension.  Diagnoses and all orders for this visit:  Essential hypertension- Her blood pressure is well controlled.  I will monitor her electrolytes and renal function. -     Basic metabolic panel; Future  Vertigo -     meclizine (ANTIVERT) 25 MG tablet; Take 1 tablet (25 mg total) by mouth 3 (three) times daily as needed for dizziness.  I have changed Missy Sabins. Hetzer's meclizine. I am also having her maintain her multivitamin with minerals, albuterol, aspirin, FLUoxetine, amLODipine, carboxymethylcellulose, cholecalciferol, calcium carbonate, fluticasone, azelastine, Probiotic Product (PROBIOTIC ADVANCED PO), chlorpheniramine, montelukast, budesonide-formoterol, atorvastatin, ALPRAZolam, and omeprazole.  Meds ordered this encounter  Medications   meclizine (ANTIVERT) 25 MG tablet    Sig: Take 1 tablet (25 mg total) by mouth 3 (three) times daily as needed for dizziness.    Dispense:  90 tablet    Refill:  3      Follow-up: No follow-ups on file.  Scarlette Calico, MD

## 2021-01-01 ENCOUNTER — Ambulatory Visit: Payer: Medicare Other

## 2021-01-01 ENCOUNTER — Telehealth: Payer: Self-pay | Admitting: Internal Medicine

## 2021-01-01 ENCOUNTER — Other Ambulatory Visit: Payer: Self-pay | Admitting: Internal Medicine

## 2021-01-01 DIAGNOSIS — I10 Essential (primary) hypertension: Secondary | ICD-10-CM

## 2021-01-01 MED ORDER — AMLODIPINE BESYLATE 2.5 MG PO TABS: 2.5000 mg | ORAL_TABLET | Freq: Every day | ORAL | 1 refills | Status: DC

## 2021-01-01 NOTE — Telephone Encounter (Signed)
Patient requesting refill for amLODipine (NORVASC) 2.5 MG tablet   Pharmacy [id number]- [place], [place] - [address]

## 2021-01-08 ENCOUNTER — Ambulatory Visit: Payer: Medicare Other

## 2021-02-27 ENCOUNTER — Ambulatory Visit
Admission: RE | Admit: 2021-02-27 | Discharge: 2021-02-27 | Disposition: A | Discharge status: Home / Self Care | Payer: Medicare Other | Source: Ambulatory Visit | Attending: Internal Medicine | Admitting: Internal Medicine

## 2021-02-27 ENCOUNTER — Other Ambulatory Visit: Payer: Self-pay

## 2021-02-27 DIAGNOSIS — Z1231 Encounter for screening mammogram for malignant neoplasm of breast: Secondary | ICD-10-CM

## 2021-03-17 ENCOUNTER — Other Ambulatory Visit: Payer: Self-pay

## 2021-03-17 ENCOUNTER — Ambulatory Visit (INDEPENDENT_AMBULATORY_CARE_PROVIDER_SITE_OTHER): Payer: Medicare Other | Admitting: Internal Medicine

## 2021-03-17 ENCOUNTER — Encounter: Payer: Self-pay | Admitting: Internal Medicine

## 2021-03-17 VITALS — BP 136/72 | HR 65 | Temp 98.2°F | Resp 16 | Ht 59.0 in | Wt 130.0 lb

## 2021-03-17 DIAGNOSIS — Z1159 Encounter for screening for other viral diseases: Secondary | ICD-10-CM | POA: Insufficient documentation

## 2021-03-17 DIAGNOSIS — E785 Hyperlipidemia, unspecified: Secondary | ICD-10-CM | POA: Diagnosis not present

## 2021-03-17 DIAGNOSIS — R55 Syncope and collapse: Secondary | ICD-10-CM | POA: Insufficient documentation

## 2021-03-17 DIAGNOSIS — R739 Hyperglycemia, unspecified: Secondary | ICD-10-CM | POA: Insufficient documentation

## 2021-03-17 DIAGNOSIS — I10 Essential (primary) hypertension: Secondary | ICD-10-CM | POA: Diagnosis not present

## 2021-03-17 LAB — HEPATIC FUNCTION PANEL
ALT: 28 U/L (ref 0–35)
AST: 36 U/L (ref 0–37)
Albumin: 4.2 g/dL (ref 3.5–5.2)
Alkaline Phosphatase: 122 U/L — ABNORMAL HIGH (ref 39–117)
Bilirubin, Direct: 0.3 mg/dL (ref 0.0–0.3)
Total Bilirubin: 1.3 mg/dL — ABNORMAL HIGH (ref 0.2–1.2)
Total Protein: 7.4 g/dL (ref 6.0–8.3)

## 2021-03-17 LAB — LIPID PANEL
Cholesterol: 152 mg/dL (ref 0–200)
HDL: 62.6 mg/dL (ref >39.00)
LDL Cholesterol: 66 mg/dL (ref 0–99)
NonHDL: 89.39
Total CHOL/HDL Ratio: 2
Triglycerides: 117 mg/dL (ref 0.0–149.0)
VLDL: 23.4 mg/dL (ref 0.0–40.0)

## 2021-03-17 LAB — BASIC METABOLIC PANEL
BUN: 13 mg/dL (ref 6–23)
CO2: 27 mEq/L (ref 19–32)
Calcium: 10.2 mg/dL (ref 8.4–10.5)
Chloride: 101 mEq/L (ref 96–112)
Creatinine, Ser: 0.93 mg/dL (ref 0.40–1.20)
GFR: 59.3 mL/min — ABNORMAL LOW (ref >60.00)
Glucose, Bld: 139 mg/dL — ABNORMAL HIGH (ref 70–99)
Potassium: 3.5 mEq/L (ref 3.5–5.1)
Sodium: 139 mEq/L (ref 135–145)

## 2021-03-17 LAB — HEMOGLOBIN A1C: Hgb A1c MFr Bld: 5.9 % (ref 4.6–6.5)

## 2021-03-17 NOTE — Progress Notes (Signed)
Subjective:  Patient ID: [name], female    DOB: [birth year]  Age: 75 y.o. MRN: [id number]  CC: Hypertension  This visit occurred during the SARS-CoV-2 public health emergency.  Safety protocols were in place, including screening questions prior to the visit, additional usage of staff PPE, and extensive cleaning of exam room while observing appropriate contact time as indicated for disinfecting solutions.    HPI LINNAEA AHN presents for f/up -  She has had 2 syncopal events in the last month.  She describes episodes of dizziness and lightheadedness and a systolic blood pressure down to 92 and 108.  It usually happens in the bathroom and she sinks to the ground without injury or trauma.  She denies loss of consciousness, headache, blurred vision, or paresthesias.  Outpatient Medications Prior to Visit  Medication Sig Dispense Refill   albuterol (PROVENTIL HFA;VENTOLIN HFA) 108 (90 Base) MCG/ACT inhaler Inhale 2 puffs into the lungs every 6 (six) hours as needed for wheezing or shortness of breath.     ALPRAZolam (XANAX) 0.25 MG tablet Take 1 tablet (0.25 mg total) by mouth 2 (two) times daily as needed for anxiety. 60 tablet 3   aspirin 81 MG tablet Take 81 mg by mouth daily.     atorvastatin (LIPITOR) 40 MG tablet TAKE 1 TABLET BY MOUTH EVERYDAY AT BEDTIME 90 tablet 1   azelastine (ASTELIN) 0.1 % nasal spray Place 2 sprays into both nostrils 2 (two) times daily. Use in each nostril as directed 30 mL 12   budesonide-formoterol (SYMBICORT) 80-4.5 MCG/ACT inhaler Symbicort 80 mcg-4.5 mcg/actuation HFA aerosol inhaler     calcium carbonate 1250 MG capsule Take 1,250 mg by mouth daily.     carboxymethylcellulose (REFRESH PLUS) 0.5 % SOLN Place 1 drop into both eyes at bedtime.     cholecalciferol (VITAMIN D) 1000 units tablet Take 1,000 Units by mouth daily.     FLUoxetine (PROZAC) 40 MG capsule Take 40 mg by mouth daily.     fluticasone (FLONASE) 50 MCG/ACT nasal spray  PLACE 2 SPRAYS INTO BOTH NOSTRILS DAILY. 2 g 1   meclizine (ANTIVERT) 25 MG tablet Take 1 tablet (25 mg total) by mouth 3 (three) times daily as needed for dizziness. 90 tablet 3   montelukast (SINGULAIR) 10 MG tablet TAKE 1 TABLET BY MOUTH EVERY DAY 30 tablet 0   Multiple Vitamins-Minerals (MULTIVITAMIN WITH MINERALS) tablet Take 1 tablet by mouth daily.     omeprazole (PRILOSEC) 40 MG capsule Take 1 capsule (40 mg total) by mouth daily. 90 capsule 1   Probiotic Product (PROBIOTIC ADVANCED PO) Take 1 capsule by mouth daily.     amLODipine (NORVASC) 2.5 MG tablet Take 1 tablet (2.5 mg total) by mouth daily. 90 tablet 1   chlorpheniramine (CHLOR-TRIMETON) 4 MG tablet Take 4 mg by mouth 2 (two) times daily.     No facility-administered medications prior to visit.    ROS Review of Systems  Constitutional:  Negative for chills, fatigue and fever.  HENT: Negative.    Eyes: Negative.   Respiratory:  Negative for cough, chest tightness, shortness of breath and wheezing.   Cardiovascular:  Negative for chest pain, palpitations and leg swelling.  Gastrointestinal:  Negative for abdominal pain.  Endocrine: Negative.   Genitourinary: Negative.  Negative for difficulty urinating.  Musculoskeletal: Negative.   Skin: Negative.   Neurological:  Positive for dizziness, syncope and light-headedness. Negative for weakness.  Hematological:  Negative for adenopathy. Does not bruise/bleed easily.  Psychiatric/Behavioral:  Negative.     Objective:  BP 136/72 (BP Location: Right Arm, Patient Position: Sitting, Cuff Size: Large)   Pulse 65   Temp 98.2 F (36.8 C) (Oral)   Ht 4\' 11"  (1.499 m)   Wt 130 lb (59 kg)   SpO2 96%   BMI 26.26 kg/m   BP Readings from Last 3 Encounters:  03/17/21 136/72  12/16/20 136/72  10/21/20 122/60    Wt Readings from Last 3 Encounters:  03/17/21 130 lb (59 kg)  12/16/20 135 lb (61.2 kg)  10/21/20 137 lb (62.1 kg)    Physical Exam Vitals reviewed.  HENT:      Nose: Nose normal.     Mouth/Throat:     Mouth: Mucous membranes are moist.  Eyes:     General: No scleral icterus.    Conjunctiva/sclera: Conjunctivae normal.  Cardiovascular:     Rate and Rhythm: Normal rate and regular rhythm.     Heart sounds: Normal heart sounds, S1 normal and S2 normal. No murmur heard.   No gallop.     Comments: EKG- NSR, 63 bpm Normal EKG Pulmonary:     Effort: Pulmonary effort is normal.     Breath sounds: No stridor. No wheezing, rhonchi or rales.  Abdominal:     General: Abdomen is flat.     Palpations: There is no mass.     Tenderness: There is no abdominal tenderness. There is no guarding.     Hernia: No hernia is present.  Musculoskeletal:        General: Normal range of motion.     Cervical back: Neck supple.     Right lower leg: No edema.     Left lower leg: No edema.  Skin:    General: Skin is warm and dry.     Findings: No rash.  Neurological:     General: No focal deficit present.     Mental Status: She is alert. Mental status is at baseline.  Psychiatric:        Mood and Affect: Mood normal.        Behavior: Behavior normal.    Lab Results  Component Value Date   WBC 9.2 10/21/2020   HGB 13.1 10/21/2020   HCT 38.5 10/21/2020   PLT 317.0 10/21/2020   GLUCOSE 139 (H) 03/17/2021   CHOL 152 03/17/2021   TRIG 117.0 03/17/2021   HDL 62.60 03/17/2021   LDLCALC 66 03/17/2021   ALT 28 03/17/2021   AST 36 03/17/2021   NA 139 03/17/2021   K 3.5 03/17/2021   CL 101 03/17/2021   CREATININE 0.93 03/17/2021   BUN 13 03/17/2021   CO2 27 03/17/2021   TSH 3.22 05/15/2020   INR 1.15 12/18/2015   HGBA1C 5.9 03/17/2021    MM 3D SCREEN BREAST BILATERAL  Result Date: 03/05/2021 CLINICAL DATA:  Screening. EXAM: DIGITAL SCREENING BILATERAL MAMMOGRAM WITH TOMOSYNTHESIS AND CAD TECHNIQUE: Bilateral screening digital craniocaudal and mediolateral oblique mammograms were obtained. Bilateral screening digital breast tomosynthesis was performed.  The images were evaluated with computer-aided detection. COMPARISON:  Previous exam(s). ACR Breast Density Category b: There are scattered areas of fibroglandular density. FINDINGS: There are no findings suspicious for malignancy. IMPRESSION: No mammographic evidence of malignancy. A result letter of this screening mammogram will be mailed directly to the patient. RECOMMENDATION: Screening mammogram in one year. (Code:SM-B-01Y) BI-RADS CATEGORY  1: Negative. Electronically Signed   By: Margarette Canada M.D.   On: 03/05/2021 08:57    Assessment & Plan:  Cynitha was seen today for hypertension.  Diagnoses and all orders for this visit:  Essential hypertension- Her blood pressure is overcontrolled.  Her EKG is reassuring. Will discontinue amlodipine. -     Hepatic function panel; Future -     Basic metabolic panel; Future -     EKG 12-Lead -     Basic metabolic panel -     Hepatic function panel  Hyperlipidemia with target LDL less than 100- LDL goal achieved. Doing well on the statin  -     Lipid panel; Future -     Hepatic function panel; Future -     Hepatic function panel -     Lipid panel  Chronic hyperglycemia -     Hemoglobin A1c; Future -     Basic metabolic panel; Future -     Basic metabolic panel -     Hemoglobin A1c  Need for hepatitis C screening test -     Hepatitis C antibody; Future -     Hepatitis C antibody  Syncope and collapse- I am concerned there may be a dysrhythmia.  I recommended that she undergo a cardiac event monitor. -     CARDIAC EVENT MONITOR; Future  I have discontinued Missy Sabins. [place]'s chlorpheniramine and amLODipine. I am also having her maintain her multivitamin with minerals, albuterol, aspirin, FLUoxetine, carboxymethylcellulose, cholecalciferol, calcium carbonate, fluticasone, azelastine, Probiotic Product (PROBIOTIC ADVANCED PO), montelukast, budesonide-formoterol, atorvastatin, ALPRAZolam, omeprazole, and meclizine.  No orders of the  defined types were placed in this encounter.    Follow-up: Return in about 6 weeks (around 04/28/2021).  Scarlette Calico, MD

## 2021-03-17 NOTE — Patient Instructions (Signed)
Syncope Syncope refers to a condition in which a person temporarily loses consciousness. Syncope may also be called fainting or passing out. It is caused by a sudden decrease in blood flow to the brain. Even though most causes of syncope are not dangerous, syncope can be a sign of a serious medical problem. Your health care provider may do tests to find the reason why you are having syncope. Signs that you may be about to faint include: Feeling dizzy or light-headed. Feeling nauseous. Seeing all white or all black in your field of vision. Having cold, clammy skin. If you faint, get medical help right away. Call your local emergency services (911 in the [place] Do not drive yourself to the hospital. Follow these instructions at home: Pay attention to any changes in your symptoms. Take these actions to stay safe and to help relieve your symptoms: Lifestyle Do not drive, use machinery, or play sports until your health care provider says it is okay. Do not drink alcohol. Do not use any products that contain nicotine or tobacco, such as cigarettes and e-cigarettes. If you need help quitting, ask your health care provider. Drink enough fluid to keep your urine pale yellow. General instructions Take over-the-counter and prescription medicines only as told by your health care provider. If you are taking blood pressure or heart medicine, get up slowly and take several minutes to sit and then stand. This can reduce dizziness or light-headedness. Have someone stay with you until you feel stable. If you start to feel like you might faint, lie down right away and raise (elevate) your feet above the level of your heart. Breathe deeply and steadily. Wait until all the symptoms have passed. Keep all follow-up visits as told by your health care provider. This is important. Get help right away if you: Have a severe headache. Faint once or repeatedly. Have pain in your chest, abdomen, or back. Have a very fast  or irregular heartbeat (palpitations). Have pain when you breathe. Are bleeding from your mouth or rectum, or you have black or tarry stool. Have a seizure. Are confused. Have trouble walking. Have severe weakness. Have vision problems. These symptoms may represent a serious problem that is an emergency. Do not wait to see if your symptoms will go away. Get medical help right away. Call your local emergency services (911 in the [place] Do not drive yourself to the hospital. Summary Syncope refers to a condition in which a person temporarily loses consciousness. It is caused by a sudden decrease in blood flow to the brain. Signs that you may be about to faint include dizziness, feeling light-headed, feeling nauseous, sudden vision changes, or cold, clammy skin. Although most causes of syncope are not dangerous, syncope can be a sign of a serious medical problem. If you faint, get medical help right away. This information is not intended to replace advice given to you by your health care provider. Make sure you discuss any questions you have with your health care provider. Document Revised: 09/04/2019 Document Reviewed: 10/04/2019 Elsevier Patient Education  [place].

## 2021-03-18 LAB — HEPATITIS C ANTIBODY
Hepatitis C Ab: NONREACTIVE
SIGNAL TO CUT-OFF: 0.02 (ref <1.00)

## 2021-03-20 ENCOUNTER — Telehealth: Payer: Self-pay | Admitting: Internal Medicine

## 2021-03-20 NOTE — Telephone Encounter (Signed)
Patient states she has started eating 3 meals a day and feels better.  Patient states she does not need to see heart doctor  Please advise

## 2021-03-21 ENCOUNTER — Other Ambulatory Visit: Payer: Self-pay | Admitting: Internal Medicine

## 2021-03-23 ENCOUNTER — Telehealth: Payer: Self-pay | Admitting: Internal Medicine

## 2021-03-23 MED ORDER — FLUOXETINE HCL 40 MG PO CAPS: 40.0000 mg | ORAL_CAPSULE | Freq: Every day | ORAL | 0 refills | Status: DC

## 2021-03-23 NOTE — Telephone Encounter (Signed)
1.Medication Requested: FLUoxetine (PROZAC) 40 MG capsule  2. Pharmacy (Name, Street, [place]): [id number] - [place], [place] - [address]  3. On Med List: yes  4. Last Visit with PCP: 03-17-2021  5. Next visit date with PCP: 04-08-2021   Agent: Please be advised that RX refills may take up to 3 business days. We ask that you follow-up with your pharmacy.

## 2021-03-27 ENCOUNTER — Other Ambulatory Visit: Payer: Self-pay | Admitting: Internal Medicine

## 2021-04-16 ENCOUNTER — Other Ambulatory Visit: Payer: Self-pay | Admitting: Internal Medicine

## 2021-04-16 ENCOUNTER — Telehealth: Payer: Self-pay

## 2021-04-16 DIAGNOSIS — F411 Generalized anxiety disorder: Secondary | ICD-10-CM

## 2021-04-16 MED ORDER — ALPRAZOLAM 0.25 MG PO TABS: 0.2500 mg | ORAL_TABLET | Freq: Three times a day (TID) | ORAL | 3 refills | Status: DC | PRN

## 2021-04-16 NOTE — Telephone Encounter (Signed)
Patient called in very upset requesting a stronger nerve/anxiety medication due to recently having hospice called in for her daughter as of last night due to cancer. Patient does not know how much longer they have.

## 2021-05-05 ENCOUNTER — Encounter: Payer: Self-pay | Admitting: Internal Medicine

## 2021-05-05 ENCOUNTER — Other Ambulatory Visit: Payer: Self-pay

## 2021-05-05 ENCOUNTER — Ambulatory Visit (INDEPENDENT_AMBULATORY_CARE_PROVIDER_SITE_OTHER): Payer: Medicare Other | Admitting: Internal Medicine

## 2021-05-05 VITALS — BP 156/82 | HR 61 | Temp 98.1°F | Ht 59.0 in | Wt 130.0 lb

## 2021-05-05 DIAGNOSIS — I10 Essential (primary) hypertension: Secondary | ICD-10-CM | POA: Diagnosis not present

## 2021-05-05 DIAGNOSIS — F324 Major depressive disorder, single episode, in partial remission: Secondary | ICD-10-CM

## 2021-05-05 DIAGNOSIS — M810 Age-related osteoporosis without current pathological fracture: Secondary | ICD-10-CM

## 2021-05-05 MED ORDER — RISEDRONATE SODIUM 35 MG PO TABS: 35.0000 mg | ORAL_TABLET | ORAL | 1 refills | Status: DC

## 2021-05-05 NOTE — Progress Notes (Addendum)
Subjective:  Patient ID: Brenda Bautista, female    DOB: 05-16-1946  Age: 75 y.o. MRN: [id number]  CC: Hypertension and Diabetes  This visit occurred during the SARS-CoV-2 public health emergency.  Safety protocols were in place, including screening questions prior to the visit, additional usage of staff PPE, and extensive cleaning of exam room while observing appropriate contact time as indicated for disinfecting solutions.    HPI Brenda Bautista presents for f/up -   Her daughter died a week ago from complications of breast cancer.  She tells me that the benzodiazepine is helping her cope with her symptoms.  She has had some anxiety and insomnia but her appetite is good and she is not losing weight.  She needs a refill on her medication for osteoporosis.  Outpatient Medications Prior to Visit  Medication Sig Dispense Refill   albuterol (PROVENTIL HFA;VENTOLIN HFA) 108 (90 Base) MCG/ACT inhaler Inhale 2 puffs into the lungs every 6 (six) hours as needed for wheezing or shortness of breath.     ALPRAZolam (XANAX) 0.25 MG tablet Take 1 tablet (0.25 mg total) by mouth 3 (three) times daily as needed for anxiety. 90 tablet 3   aspirin 81 MG tablet Take 81 mg by mouth daily.     atorvastatin (LIPITOR) 40 MG tablet TAKE 1 TABLET BY MOUTH EVERYDAY AT BEDTIME 90 tablet 1   azelastine (ASTELIN) 0.1 % nasal spray Place 2 sprays into both nostrils 2 (two) times daily. Use in each nostril as directed 30 mL 12   budesonide-formoterol (SYMBICORT) 80-4.5 MCG/ACT inhaler Symbicort 80 mcg-4.5 mcg/actuation HFA aerosol inhaler     calcium carbonate 1250 MG capsule Take 1,250 mg by mouth daily.     carboxymethylcellulose (REFRESH PLUS) 0.5 % SOLN Place 1 drop into both eyes at bedtime.     cholecalciferol (VITAMIN D) 1000 units tablet Take 1,000 Units by mouth daily.     FLUoxetine (PROZAC) 40 MG capsule TAKE 1 CAPSULE (40 MG TOTAL) BY MOUTH DAILY. 90 capsule 1   fluticasone (FLONASE) 50 MCG/ACT  nasal spray PLACE 2 SPRAYS INTO BOTH NOSTRILS DAILY. 2 g 1   meclizine (ANTIVERT) 25 MG tablet Take 1 tablet (25 mg total) by mouth 3 (three) times daily as needed for dizziness. 90 tablet 3   montelukast (SINGULAIR) 10 MG tablet TAKE 1 TABLET BY MOUTH EVERY DAY 30 tablet 0   Multiple Vitamins-Minerals (MULTIVITAMIN WITH MINERALS) tablet Take 1 tablet by mouth daily.     omeprazole (PRILOSEC) 40 MG capsule Take 1 capsule (40 mg total) by mouth daily. 90 capsule 1   Probiotic Product (PROBIOTIC ADVANCED PO) Take 1 capsule by mouth daily.     No facility-administered medications prior to visit.    ROS Review of Systems  Constitutional:  Negative for appetite change, diaphoresis, fatigue and unexpected weight change.  HENT: Negative.    Eyes:  Negative for visual disturbance.  Respiratory:  Negative for cough, chest tightness, shortness of breath and wheezing.   Cardiovascular:  Negative for chest pain, palpitations and leg swelling.  Gastrointestinal:  Negative for abdominal pain, constipation, diarrhea and nausea.  Endocrine: Negative.   Genitourinary: Negative.  Negative for difficulty urinating.  Musculoskeletal: Negative.   Skin: Negative.   Neurological:  Negative for dizziness, weakness and light-headedness.  Hematological:  Negative for adenopathy. Does not bruise/bleed easily.  Psychiatric/Behavioral:  Positive for dysphoric mood and sleep disturbance. Negative for confusion and decreased concentration. The patient is nervous/anxious.    Objective:  BP Marland Kitchen)  156/82 (BP Location: Right Arm, Patient Position: Sitting, Cuff Size: Large)   Pulse 61   Temp 98.1 F (36.7 C) (Oral)   Ht 4\' 11"  (1.499 m)   Wt 130 lb (59 kg)   SpO2 95%   BMI 26.26 kg/m   BP Readings from Last 3 Encounters:  05/05/21 (!) 156/82  03/17/21 136/72  12/16/20 136/72    Wt Readings from Last 3 Encounters:  05/05/21 130 lb (59 kg)  03/17/21 130 lb (59 kg)  12/16/20 135 lb (61.2 kg)    Physical  Exam Vitals reviewed.  HENT:     Nose: Nose normal.     Mouth/Throat:     Mouth: Mucous membranes are moist.  Eyes:     General: No scleral icterus.    Conjunctiva/sclera: Conjunctivae normal.  Cardiovascular:     Rate and Rhythm: Normal rate and regular rhythm.     Heart sounds: No murmur heard. Pulmonary:     Effort: Pulmonary effort is normal.     Breath sounds: No stridor. No wheezing, rhonchi or rales.  Abdominal:     General: Abdomen is flat.     Palpations: There is no mass.     Tenderness: There is no abdominal tenderness. There is no guarding.     Hernia: No hernia is present.  Musculoskeletal:        General: Normal range of motion.     Cervical back: Neck supple.     Right lower leg: No edema.     Left lower leg: No edema.  Skin:    General: Skin is warm.  Neurological:     General: No focal deficit present.     Mental Status: She is alert. Mental status is at baseline.  Psychiatric:        Mood and Affect: Mood normal.        Behavior: Behavior normal.        Thought Content: Thought content normal.        Judgment: Judgment normal.    Lab Results  Component Value Date   WBC 9.2 10/21/2020   HGB 13.1 10/21/2020   HCT 38.5 10/21/2020   PLT 317.0 10/21/2020   GLUCOSE 139 (H) 03/17/2021   CHOL 152 03/17/2021   TRIG 117.0 03/17/2021   HDL 62.60 03/17/2021   LDLCALC 66 03/17/2021   ALT 28 03/17/2021   AST 36 03/17/2021   NA 139 03/17/2021   K 3.5 03/17/2021   CL 101 03/17/2021   CREATININE 0.93 03/17/2021   BUN 13 03/17/2021   CO2 27 03/17/2021   TSH 3.22 05/15/2020   INR 1.15 12/18/2015   HGBA1C 5.9 03/17/2021    MM 3D SCREEN BREAST BILATERAL  Result Date: 03/05/2021 CLINICAL DATA:  Screening. EXAM: DIGITAL SCREENING BILATERAL MAMMOGRAM WITH TOMOSYNTHESIS AND CAD TECHNIQUE: Bilateral screening digital craniocaudal and mediolateral oblique mammograms were obtained. Bilateral screening digital breast tomosynthesis was performed. The images were  evaluated with computer-aided detection. COMPARISON:  Previous exam(s). ACR Breast Density Category b: There are scattered areas of fibroglandular density. FINDINGS: There are no findings suspicious for malignancy. IMPRESSION: No mammographic evidence of malignancy. A result letter of this screening mammogram will be mailed directly to the patient. RECOMMENDATION: Screening mammogram in one year. (Code:SM-B-01Y) BI-RADS CATEGORY  1: Negative. Electronically Signed   By: Margarette Canada M.D.   On: 03/05/2021 08:57    Assessment & Plan:   Promyse was seen today for hypertension and diabetes.  Diagnoses and all orders for this  visit:  Age-related osteoporosis without current pathological fracture -     risedronate (ACTONEL) 35 MG tablet; Take 1 tablet (35 mg total) by mouth every 7 (seven) days. with water on empty stomach, nothing by mouth or lie down for next 30 minutes.  Essential hypertension- Her BP is well controlled.  Major depressive disorder in partial remission, unspecified whether recurrent ([place])- She is experiencing an adjustment reaction to her daughter's death with insomnia and anxiety.  She feels like this is temporary.  She does not want to add anything to Prozac or Xanax. Late note - she asked that something be added for depression.  I am having Brenda Bautista. Brenda Bautista start on risedronate. I am also having her maintain her multivitamin with minerals, albuterol, aspirin, carboxymethylcellulose, cholecalciferol, calcium carbonate, fluticasone, azelastine, Probiotic Product (PROBIOTIC ADVANCED PO), montelukast, budesonide-formoterol, omeprazole, meclizine, atorvastatin, FLUoxetine, and ALPRAZolam.  Meds ordered this encounter  Medications   risedronate (ACTONEL) 35 MG tablet    Sig: Take 1 tablet (35 mg total) by mouth every 7 (seven) days. with water on empty stomach, nothing by mouth or lie down for next 30 minutes.    Dispense:  12 tablet    Refill:  1     Follow-up: Return in  about 6 months (around 11/02/2021).  Scarlette Calico, MD

## 2021-05-05 NOTE — Patient Instructions (Signed)

## 2021-05-06 DIAGNOSIS — F324 Major depressive disorder, single episode, in partial remission: Secondary | ICD-10-CM | POA: Insufficient documentation

## 2021-05-08 ENCOUNTER — Telehealth: Payer: Self-pay | Admitting: Internal Medicine

## 2021-05-08 MED ORDER — BREXPIPRAZOLE 0.25 MG PO TABS: 0.2500 mg | ORAL_TABLET | Freq: Every day | ORAL | 0 refills | Status: DC

## 2021-05-08 NOTE — Addendum Note (Signed)
Addended by: Janith Lima on: 05/08/2021 11:14 AM   Modules accepted: Orders

## 2021-05-08 NOTE — Telephone Encounter (Signed)
Patient calling in  Had OV 11/29 w/ Ronnald Ramp  Patient says they were discussing her increased state of depression due to her daughter recently passing away due to cancer & was going to prescribe her some additional medication but she declined at the time bc she thought she didn't need it  Patient says she does need this medication & wants provider to send it to pharmacy  [id number] [address], [place]  Phone:  [phone] Fax:  [phone]

## 2021-05-12 ENCOUNTER — Other Ambulatory Visit: Payer: Self-pay | Admitting: Internal Medicine

## 2021-05-12 DIAGNOSIS — I10 Essential (primary) hypertension: Secondary | ICD-10-CM

## 2021-06-18 ENCOUNTER — Other Ambulatory Visit: Payer: Self-pay | Admitting: Internal Medicine

## 2021-06-18 DIAGNOSIS — K2100 Gastro-esophageal reflux disease with esophagitis, without bleeding: Secondary | ICD-10-CM

## 2021-08-13 ENCOUNTER — Other Ambulatory Visit: Payer: Self-pay | Admitting: Internal Medicine

## 2021-08-13 DIAGNOSIS — F324 Major depressive disorder, single episode, in partial remission: Secondary | ICD-10-CM

## 2021-08-27 ENCOUNTER — Other Ambulatory Visit: Payer: Self-pay | Admitting: Internal Medicine

## 2021-10-12 ENCOUNTER — Other Ambulatory Visit: Payer: Self-pay | Admitting: Internal Medicine

## 2021-10-20 ENCOUNTER — Other Ambulatory Visit: Payer: Self-pay | Admitting: Internal Medicine

## 2021-10-20 DIAGNOSIS — M810 Age-related osteoporosis without current pathological fracture: Secondary | ICD-10-CM

## 2021-11-03 ENCOUNTER — Ambulatory Visit: Payer: Medicare Other | Admitting: Internal Medicine

## 2021-11-30 ENCOUNTER — Ambulatory Visit: Payer: Medicare Other | Admitting: Internal Medicine

## 2021-12-17 ENCOUNTER — Other Ambulatory Visit: Payer: Self-pay | Admitting: Internal Medicine

## 2021-12-17 DIAGNOSIS — K2100 Gastro-esophageal reflux disease with esophagitis, without bleeding: Secondary | ICD-10-CM

## 2021-12-29 ENCOUNTER — Other Ambulatory Visit: Payer: Self-pay | Admitting: Internal Medicine

## 2021-12-29 DIAGNOSIS — I10 Essential (primary) hypertension: Secondary | ICD-10-CM

## 2022-01-13 ENCOUNTER — Other Ambulatory Visit: Payer: Self-pay | Admitting: Internal Medicine

## 2022-01-13 DIAGNOSIS — M810 Age-related osteoporosis without current pathological fracture: Secondary | ICD-10-CM

## 2022-01-27 ENCOUNTER — Other Ambulatory Visit: Payer: Self-pay | Admitting: Internal Medicine

## 2022-01-27 DIAGNOSIS — E785 Hyperlipidemia, unspecified: Secondary | ICD-10-CM

## 2022-01-27 DIAGNOSIS — F324 Major depressive disorder, single episode, in partial remission: Secondary | ICD-10-CM

## 2022-03-08 ENCOUNTER — Telehealth: Payer: Self-pay | Admitting: Internal Medicine

## 2022-03-08 NOTE — Telephone Encounter (Signed)
Left message for patient to call back and schedule Medicare Annual Wellness Visit (AWV).   Please offer to do virtually or by telephone.  Left office number and my jabber [phone].  AWVI elligible as of 09/05/2009   Please schedule at anytime with Nurse Health Advisor.

## 2022-04-13 ENCOUNTER — Telehealth: Payer: Self-pay | Admitting: Internal Medicine

## 2022-04-13 NOTE — Telephone Encounter (Signed)
Patient is trying to schedule an appointment because its been about a year and she's do for medication refills but doesn't have a way to get here. I really havent had to use the transportation services option so I wasn't sure how to check. Would patient be eligible? [phone] is call back. Ill do it if I get showed how to

## 2022-04-14 NOTE — Telephone Encounter (Signed)
Patient aware and will call back to schedule

## 2022-04-15 ENCOUNTER — Telehealth: Payer: Self-pay | Admitting: Internal Medicine

## 2022-04-15 NOTE — Telephone Encounter (Signed)
N/A unable to leave a message for patient to call back to schedule Medicare Annual Wellness Visit   No hx of AWV eligible as of 09/05/09  Please schedule at anytime with [facility] Advisor if patient calls the office back.     Any questions, please call me at [phone]

## 2022-06-14 ENCOUNTER — Other Ambulatory Visit: Payer: Self-pay | Admitting: Internal Medicine

## 2022-06-14 DIAGNOSIS — K2100 Gastro-esophageal reflux disease with esophagitis, without bleeding: Secondary | ICD-10-CM

## 2022-07-01 ENCOUNTER — Telehealth: Payer: Self-pay | Admitting: Pulmonary Disease

## 2022-07-01 NOTE — Telephone Encounter (Signed)
Left message for patient to call back.

## 2022-07-05 ENCOUNTER — Other Ambulatory Visit: Payer: Self-pay | Admitting: Internal Medicine

## 2022-07-05 DIAGNOSIS — I10 Essential (primary) hypertension: Secondary | ICD-10-CM

## 2022-07-26 ENCOUNTER — Ambulatory Visit (INDEPENDENT_AMBULATORY_CARE_PROVIDER_SITE_OTHER): Payer: 59 | Admitting: Pulmonary Disease

## 2022-07-26 ENCOUNTER — Encounter: Payer: Self-pay | Admitting: Pulmonary Disease

## 2022-07-26 VITALS — BP 142/72 | HR 61 | Temp 98.0°F | Ht 59.0 in | Wt 129.0 lb

## 2022-07-26 DIAGNOSIS — J4520 Mild intermittent asthma, uncomplicated: Secondary | ICD-10-CM | POA: Diagnosis not present

## 2022-07-26 MED ORDER — BUDESONIDE-FORMOTEROL FUMARATE 80-4.5 MCG/ACT IN AERO: 2.0000 | INHALATION_SPRAY | Freq: Two times a day (BID) | RESPIRATORY_TRACT | 5 refills | Status: DC

## 2022-07-26 NOTE — Patient Instructions (Signed)
Continue using the Symbicort on a regular basis.  Take at least 1 puff twice daily.  If needed he can use extra puffs as a rescue inhaler Continue monitoring symptoms Follow-up in 1 year.

## 2022-07-26 NOTE — Progress Notes (Signed)
Brenda Bautista    UT:[id number]    [birth year]  Primary Care Physician:Jones, Arvid Right, MD  Referring Physician: Janith Lima, MD [address] [place],  [place] [address]  Chief complaint:  Follow up for  Asthma Upper airway cough syndrome Post nasal drip GERD  HPI: Brenda Bautista is a 77 y.o.  with past medical history of asthma.  She has a chief complaint is dyspnea on rest and exertion associated with occasional wheeze. She has daily cough, nonproductive in nature. She has significant issues with acid reflux and is currently on Prilosec 40 a day. She was diagnosed with asthma many years ago. She has seasonal allergies which is worse in spring and fall and reports sensitivity to tree pollen, dust. She does not have any pets at home, no mold issues.  She has been evaluated by cardiology for atypical chest pain. She is also noted to have mildly elevated ANA and has been evaluated by rheumatology. There is no evidence of ILD on previous imaging. She has had recurrent episodes of sinusitis, upper respiratory tract infection requiring antibiotics and prednisone.  Symptoms are mostly confined to upper airway she does not significant symptoms of cough, sputum production, dyspnea or wheezing.. She sings in a church choir and notices hoarseness, shortness of breath with wheezing at times while singing.  Seen by [facility]NT at [facility] in January 2019 with sinus CT which is negative.  Interim history: Last seen by me in 2019.  At that time we had stopped the Symbicort as it was felt she did not need controller medication She got restarted on it in 2022 by primary care due to intermittent episodes of dyspnea with wheezing  She apparently has not been using Symbicort inhaler on a regular basis and is using it only intermittently.  Does complain of mild increase in dyspnea, wheezing.  Outpatient Encounter Medications as of 07/26/2022  Medication Sig   ALPRAZolam (XANAX) 0.25 MG  tablet Take 1 tablet (0.25 mg total) by mouth 3 (three) times daily as needed for anxiety.   amLODipine (NORVASC) 2.5 MG tablet TAKE 1 TABLET BY MOUTH EVERY DAY   aspirin 81 MG tablet Take 81 mg by mouth daily.   atorvastatin (LIPITOR) 40 MG tablet TAKE 1 TABLET BY MOUTH EVERYDAY AT BEDTIME   azelastine (ASTELIN) 0.1 % nasal spray Place 2 sprays into both nostrils 2 (two) times daily. Use in each nostril as directed   budesonide-formoterol (SYMBICORT) 80-4.5 MCG/ACT inhaler Symbicort 80 mcg-4.5 mcg/actuation HFA aerosol inhaler   calcium carbonate 1250 MG capsule Take 1,250 mg by mouth daily.   carboxymethylcellulose (REFRESH PLUS) 0.5 % SOLN Place 1 drop into both eyes at bedtime.   cholecalciferol (VITAMIN D) 1000 units tablet Take 1,000 Units by mouth daily.   FLUoxetine (PROZAC) 40 MG capsule TAKE 1 CAPSULE (40 MG TOTAL) BY MOUTH DAILY.   fluticasone (FLONASE) 50 MCG/ACT nasal spray PLACE 2 SPRAYS INTO BOTH NOSTRILS DAILY.   meclizine (ANTIVERT) 25 MG tablet Take 1 tablet (25 mg total) by mouth 3 (three) times daily as needed for dizziness.   montelukast (SINGULAIR) 10 MG tablet TAKE 1 TABLET BY MOUTH EVERY DAY   Multiple Vitamins-Minerals (MULTIVITAMIN WITH MINERALS) tablet Take 1 tablet by mouth daily.   omeprazole (PRILOSEC) 40 MG capsule TAKE 1 CAPSULE (40 MG TOTAL) BY MOUTH DAILY.   Probiotic Product (PROBIOTIC ADVANCED PO) Take 1 capsule by mouth daily.   REXULTI 0.25 MG TABS tablet TAKE  1 TABLET BY MOUTH EVERY DAY   risedronate (ACTONEL) 35 MG tablet TAKE 1 TABLET EVERY 7 DAYS W/WATER ON EMPTY STOMACH, NOTHING BY MOUTH OR LIE DOWN FOR NEXT 30MINUTES   albuterol (PROVENTIL HFA;VENTOLIN HFA) 108 (90 Base) MCG/ACT inhaler Inhale 2 puffs into the lungs every 6 (six) hours as needed for wheezing or shortness of breath. (Patient not taking: Reported on 07/26/2022)   No facility-administered encounter medications on file as of 07/26/2022.   Physical Exam: Blood pressure (!) 142/72, pulse 61,  temperature 98 F (36.7 C), temperature source Oral, height '4\' 11"'$  (1.499 m), weight 129 lb (58.5 kg), SpO2 99 %. Gen:      No acute distress HEENT:  EOMI, sclera anicteric Neck:     No masses; no thyromegaly Lungs:    Clear to auscultation bilaterally; normal respiratory effort CV:         Regular rate and rhythm; no murmurs Abd:      + bowel sounds; soft, non-tender; no palpable masses, no distension Ext:    No edema; adequate peripheral perfusion Skin:      Warm and dry; no rash Neuro: alert and oriented x 3 Psych: normal mood and affect   Data Reviewed: Imaging: CT high-resolution 06/20/17-no interstitial lung disease, mild air trapping, 4 mm right upper lobe nodule.  Chest x-ray 08/18/2020-clear lungs with no acute abnormality I have reviewed the images personally  CT sinuses [facility] 07/06/17- 1.  No active sinusitis or sinus outflow obstruction. 2. Mild rightward nasal septal deviation and leftward spurring.  PFTs 03/24/16 FVC 1.92 [82%), FEV1 1.7 (90%), F/F 89, TLC 75%, DLCO 61%, corrected 102% Minimal restriction, moderate diffusion defect which corrects for alveolar volume  10/21/2020 FVC 1.77 [82%], FEV1 1.65 [93%], F/F 93, TLC 3.18 [93%], DLCO 13.65 [85%] Minimal restriction.  FENO 03/24/16-14  Labs Allergy profile 06/20/17- IgE less than 2, RAST panel negative ANA, CCP, rheumatoid factor negative IgG 936  CBC 10/21/2020-WBC 9.2, eos 1.6%, absolute eosinophil count 147 IgE 10/21/2020-2  Cardiac: Echo 12/19/15 Left ventricle: The cavity size was normal. Wall thickness was normal. Systolic function was normal. The estimated ejection fraction was in the range of 60% to 65%. Wall motion was normal; there were no regional wall motion abnormalities. Doppler parameters are consistent with abnormal left ventricular relaxation (grade 1 diastolic dysfunction).  Assessment:  Upper airway cough, recurrent sinusitis, GERD She has had recurrent episodes of sinusitis with  upper airway infection. Seen by ENT with negative sinus CT. Continues on singulair, Flonase nasal spray and Prilosec twice daily.    Mild intermittent asthma Using Symbicort only intermittently with mild increase in dyspnea, wheezing.  We have reviewed the inhaler use and asked her to use it on a regular basis every day as it is a controller medication Continue albuterol as needed.  CT scan in past with no evidence of interstitial lung disease.  There is a small subcentimeter pulmonary nodule which is likely benign in a non-smoker.  Plan/Recommendations: - Continue Symbicort, albuterol as needed.  Marshell Garfinkel MD [facility] Pager [phone] 07/26/2022, 11:46 AM  CC: Janith Lima, MD

## 2022-08-02 ENCOUNTER — Telehealth: Payer: Self-pay

## 2022-08-02 NOTE — Telephone Encounter (Signed)
Called patient to schedule Medicare Annual Wellness Visit (AWV). No voicemail available to leave a message.  Last date of AWV: No HX of AWV, eligible 09/05/09  Please schedule an appointment at any time with [facility].   Norton Blizzard, [place] ([facility])  [place] [facility] [phone]

## 2022-08-23 ENCOUNTER — Ambulatory Visit: Payer: 59 | Admitting: Internal Medicine

## 2022-09-03 ENCOUNTER — Other Ambulatory Visit: Payer: Self-pay | Admitting: Internal Medicine

## 2022-09-03 DIAGNOSIS — E785 Hyperlipidemia, unspecified: Secondary | ICD-10-CM

## 2022-09-09 ENCOUNTER — Ambulatory Visit: Payer: 59 | Admitting: Internal Medicine

## 2022-09-13 ENCOUNTER — Ambulatory Visit: Payer: 59 | Admitting: Internal Medicine

## 2022-09-24 ENCOUNTER — Other Ambulatory Visit: Payer: Self-pay | Admitting: Internal Medicine

## 2022-09-24 DIAGNOSIS — E785 Hyperlipidemia, unspecified: Secondary | ICD-10-CM

## 2022-10-10 ENCOUNTER — Other Ambulatory Visit: Payer: Self-pay | Admitting: Internal Medicine

## 2022-10-10 DIAGNOSIS — E785 Hyperlipidemia, unspecified: Secondary | ICD-10-CM

## 2022-11-05 ENCOUNTER — Other Ambulatory Visit: Payer: Self-pay | Admitting: Internal Medicine

## 2022-11-05 DIAGNOSIS — E785 Hyperlipidemia, unspecified: Secondary | ICD-10-CM

## 2022-11-21 ENCOUNTER — Other Ambulatory Visit: Payer: Self-pay | Admitting: Internal Medicine

## 2022-11-21 DIAGNOSIS — E785 Hyperlipidemia, unspecified: Secondary | ICD-10-CM

## 2022-11-22 ENCOUNTER — Ambulatory Visit (INDEPENDENT_AMBULATORY_CARE_PROVIDER_SITE_OTHER): Payer: 59 | Admitting: Internal Medicine

## 2022-11-22 ENCOUNTER — Encounter: Payer: Self-pay | Admitting: Internal Medicine

## 2022-11-22 VITALS — BP 136/76 | HR 64 | Temp 98.3°F | Ht 59.0 in | Wt 125.0 lb

## 2022-11-22 DIAGNOSIS — R739 Hyperglycemia, unspecified: Secondary | ICD-10-CM

## 2022-11-22 DIAGNOSIS — Z0000 Encounter for general adult medical examination without abnormal findings: Secondary | ICD-10-CM | POA: Diagnosis not present

## 2022-11-22 DIAGNOSIS — I10 Essential (primary) hypertension: Secondary | ICD-10-CM

## 2022-11-22 DIAGNOSIS — K2100 Gastro-esophageal reflux disease with esophagitis, without bleeding: Secondary | ICD-10-CM | POA: Diagnosis not present

## 2022-11-22 DIAGNOSIS — E2839 Other primary ovarian failure: Secondary | ICD-10-CM

## 2022-11-22 DIAGNOSIS — Z0001 Encounter for general adult medical examination with abnormal findings: Secondary | ICD-10-CM | POA: Insufficient documentation

## 2022-11-22 DIAGNOSIS — F324 Major depressive disorder, single episode, in partial remission: Secondary | ICD-10-CM | POA: Diagnosis not present

## 2022-11-22 DIAGNOSIS — M810 Age-related osteoporosis without current pathological fracture: Secondary | ICD-10-CM | POA: Diagnosis not present

## 2022-11-22 DIAGNOSIS — E785 Hyperlipidemia, unspecified: Secondary | ICD-10-CM

## 2022-11-22 DIAGNOSIS — N1832 Chronic kidney disease, stage 3b: Secondary | ICD-10-CM

## 2022-11-22 LAB — CBC WITH DIFFERENTIAL/PLATELET
Basophils Absolute: 0.1 10*3/uL (ref 0.0–0.1)
Basophils Relative: 1.1 % (ref 0.0–3.0)
Eosinophils Absolute: 0.1 10*3/uL (ref 0.0–0.7)
Eosinophils Relative: 1.1 % (ref 0.0–5.0)
HCT: 40.5 % (ref 36.0–46.0)
Hemoglobin: 13.5 g/dL (ref 12.0–15.0)
Lymphocytes Relative: 36.1 % (ref 12.0–46.0)
Lymphs Abs: 2.6 10*3/uL (ref 0.7–4.0)
MCHC: 33.2 g/dL (ref 30.0–36.0)
MCV: 93.1 fl (ref 78.0–100.0)
Monocytes Absolute: 0.7 10*3/uL (ref 0.1–1.0)
Monocytes Relative: 10 % (ref 3.0–12.0)
Neutro Abs: 3.7 10*3/uL (ref 1.4–7.7)
Neutrophils Relative %: 51.7 % (ref 43.0–77.0)
Platelets: 363 10*3/uL (ref 150.0–400.0)
RBC: 4.35 Mil/uL (ref 3.87–5.11)
RDW: 13.7 % (ref 11.5–15.5)
WBC: 7.1 10*3/uL (ref 4.0–10.5)

## 2022-11-22 LAB — HEPATIC FUNCTION PANEL
ALT: 19 U/L (ref 0–35)
AST: 31 U/L (ref 0–37)
Albumin: 4.5 g/dL (ref 3.5–5.2)
Alkaline Phosphatase: 113 U/L (ref 39–117)
Bilirubin, Direct: 0.2 mg/dL (ref 0.0–0.3)
Total Bilirubin: 1.4 mg/dL — ABNORMAL HIGH (ref 0.2–1.2)
Total Protein: 8.1 g/dL (ref 6.0–8.3)

## 2022-11-22 LAB — TSH: TSH: 3.54 u[IU]/mL (ref 0.35–5.50)

## 2022-11-22 LAB — BASIC METABOLIC PANEL
BUN: 15 mg/dL (ref 6–23)
CO2: 29 mEq/L (ref 19–32)
Calcium: 10.1 mg/dL (ref 8.4–10.5)
Chloride: 102 mEq/L (ref 96–112)
Creatinine, Ser: 1.02 mg/dL (ref 0.40–1.20)
GFR: 52.46 mL/min — ABNORMAL LOW (ref >60.00)
Glucose, Bld: 88 mg/dL (ref 70–99)
Potassium: 4.1 mEq/L (ref 3.5–5.1)
Sodium: 139 mEq/L (ref 135–145)

## 2022-11-22 LAB — LIPID PANEL
Cholesterol: 194 mg/dL (ref 0–200)
HDL: 55.8 mg/dL (ref >39.00)
LDL Cholesterol: 103 mg/dL — ABNORMAL HIGH (ref 0–99)
NonHDL: 137.94
Total CHOL/HDL Ratio: 3
Triglycerides: 175 mg/dL — ABNORMAL HIGH (ref 0.0–149.0)
VLDL: 35 mg/dL (ref 0.0–40.0)

## 2022-11-22 LAB — VITAMIN D 25 HYDROXY (VIT D DEFICIENCY, FRACTURES): VITD: 76.2 ng/mL (ref 30.00–100.00)

## 2022-11-22 LAB — HEMOGLOBIN A1C: Hgb A1c MFr Bld: 5.8 % (ref 4.6–6.5)

## 2022-11-22 LAB — PHOSPHORUS: Phosphorus: 3.7 mg/dL (ref 2.3–4.6)

## 2022-11-22 MED ORDER — BOOSTRIX 5-2.5-18.5 LF-MCG/0.5 IM SUSP
0.5000 mL | Freq: Once | INTRAMUSCULAR | 0 refills | Status: AC
Start: 2022-11-22 — End: 2022-11-22

## 2022-11-22 MED ORDER — SHINGRIX 50 MCG/0.5ML IM SUSR: 0.5000 mL | Freq: Once | INTRAMUSCULAR | 1 refills | Status: AC

## 2022-11-22 NOTE — Patient Instructions (Signed)

## 2022-11-22 NOTE — Progress Notes (Unsigned)
Subjective:  Patient ID: Brenda Bautista, female    DOB: 20-Oct-1945  Age: 77 y.o. MRN: [id number]  CC: Annual Exam, Hypertension, and Hyperlipidemia   HPI ENGRACIA PLEASANT presents for a CPX and f/up ---   Discussed the use of AI scribe software for clinical note transcription with the patient, who gave verbal consent to proceed.  History of Present Illness   The patient, with a history of unspecified bone disease, heartburn, and possible respiratory condition, presents for a routine check-up after approximately two years. She reports overall good health, denying any recent illnesses, chest pain, shortness of breath, or dizziness. She maintains an active lifestyle and does not report any breathing difficulties.  The patient's medication regimen has been significantly reduced over the past two years, with the discontinuation of all previous medications, including a bone medication and heartburn medication. She was recently prescribed a new medication, the details of which are unclear. She also uses an inhaler, prescribed by a specialist, twice daily.  The patient has experienced some weight fluctuation, with a net loss of approximately four pounds over the past five months. However, she does not express any concern regarding this. She denies any current symptoms of heartburn or dysphagia, although she has a history of acid reflux. The frequency and severity of her reflux symptoms appear to be influenced by her diet.       Outpatient Medications Prior to Visit  Medication Sig Dispense Refill   albuterol (PROVENTIL HFA;VENTOLIN HFA) 108 (90 Base) MCG/ACT inhaler Inhale 2 puffs into the lungs every 6 (six) hours as needed for wheezing or shortness of breath.     ALPRAZolam (XANAX) 0.25 MG tablet Take 1 tablet (0.25 mg total) by mouth 3 (three) times daily as needed for anxiety. 90 tablet 3   aspirin 81 MG tablet Take 81 mg by mouth daily.     azelastine (ASTELIN) 0.1 % nasal spray  Place 2 sprays into both nostrils 2 (two) times daily. Use in each nostril as directed 30 mL 12   budesonide-formoterol (SYMBICORT) 80-4.5 MCG/ACT inhaler Inhale 2 puffs into the lungs in the morning and at bedtime. 10.2 g 5   calcium carbonate 1250 MG capsule Take 1,250 mg by mouth daily.     carboxymethylcellulose (REFRESH PLUS) 0.5 % SOLN Place 1 drop into both eyes at bedtime.     cholecalciferol (VITAMIN D) 1000 units tablet Take 1,000 Units by mouth daily.     FLUoxetine (PROZAC) 40 MG capsule TAKE 1 CAPSULE (40 MG TOTAL) BY MOUTH DAILY. 90 capsule 1   fluticasone (FLONASE) 50 MCG/ACT nasal spray PLACE 2 SPRAYS INTO BOTH NOSTRILS DAILY. 2 g 1   montelukast (SINGULAIR) 10 MG tablet TAKE 1 TABLET BY MOUTH EVERY DAY 30 tablet 0   Multiple Vitamins-Minerals (MULTIVITAMIN WITH MINERALS) tablet Take 1 tablet by mouth daily.     Probiotic Product (PROBIOTIC ADVANCED PO) Take 1 capsule by mouth daily.     REXULTI 0.25 MG TABS tablet TAKE 1 TABLET BY MOUTH EVERY DAY 90 tablet 1   risedronate (ACTONEL) 35 MG tablet TAKE 1 TABLET EVERY 7 DAYS W/WATER ON EMPTY STOMACH, NOTHING BY MOUTH OR LIE DOWN FOR NEXT 12 tablet 0   amLODipine (NORVASC) 2.5 MG tablet TAKE 1 TABLET BY MOUTH EVERY DAY 90 tablet 1   atorvastatin (LIPITOR) 40 MG tablet TAKE 1 TABLET BY MOUTH EVERYDAY AT BEDTIME 90 tablet 0   meclizine (ANTIVERT) 25 MG tablet Take 1 tablet (25 mg total) by  mouth 3 (three) times daily as needed for dizziness. 90 tablet 3   omeprazole (PRILOSEC) 40 MG capsule TAKE 1 CAPSULE (40 MG TOTAL) BY MOUTH DAILY. 90 capsule 1   No facility-administered medications prior to visit.    ROS Review of Systems  Constitutional: Negative.  Negative for diaphoresis and fatigue.  HENT: Negative.    Eyes: Negative.   Respiratory:  Negative for cough, chest tightness, shortness of breath and wheezing.   Cardiovascular:  Negative for chest pain, palpitations and leg swelling.  Gastrointestinal:  Negative for  abdominal pain, diarrhea, nausea and vomiting.  Endocrine: Negative.   Genitourinary: Negative.   Musculoskeletal:  Negative for arthralgias and myalgias.  Skin: Negative.  Negative for color change and pallor.  Neurological:  Negative for dizziness, weakness, light-headedness and headaches.  Hematological:  Negative for adenopathy. Does not bruise/bleed easily.  Psychiatric/Behavioral: Negative.      Objective:  BP 136/76 (BP Location: Left Arm, Patient Position: Sitting, Cuff Size: Large)   Pulse 64   Temp 98.3 F (36.8 C) (Oral)   Ht 4\' 11"  (1.499 m)   Wt 125 lb (56.7 kg)   SpO2 95%   BMI 25.25 kg/m   BP Readings from Last 3 Encounters:  11/22/22 136/76  07/26/22 (!) 142/72  05/05/21 (!) 156/82    Wt Readings from Last 3 Encounters:  11/22/22 125 lb (56.7 kg)  07/26/22 129 lb (58.5 kg)  05/05/21 130 lb (59 kg)    Physical Exam Vitals reviewed.  Constitutional:      Appearance: Normal appearance.  HENT:     Nose: Nose normal.     Mouth/Throat:     Mouth: Mucous membranes are moist.  Eyes:     General: No scleral icterus.    Conjunctiva/sclera: Conjunctivae normal.  Cardiovascular:     Rate and Rhythm: Normal rate and regular rhythm.     Heart sounds: Murmur heard.     Systolic murmur is present with a grade of 1/6.     No diastolic murmur is present.     No friction rub. No gallop.  Pulmonary:     Effort: Pulmonary effort is normal.     Breath sounds: No stridor. No wheezing, rhonchi or rales.  Abdominal:     General: Abdomen is flat.     Palpations: There is no mass.     Tenderness: There is no abdominal tenderness. There is no guarding or rebound.     Hernia: No hernia is present.  Musculoskeletal:        General: No deformity.     Cervical back: Neck supple.     Right lower leg: No edema.     Left lower leg: No edema.  Lymphadenopathy:     Cervical: No cervical adenopathy.  Skin:    General: Skin is warm and dry.  Neurological:     General: No  focal deficit present.     Mental Status: She is alert. Mental status is at baseline.  Psychiatric:        Mood and Affect: Mood normal.        Behavior: Behavior normal.     Lab Results  Component Value Date   WBC 7.1 11/22/2022   HGB 13.5 11/22/2022   HCT 40.5 11/22/2022   PLT 363.0 11/22/2022   GLUCOSE 88 11/22/2022   CHOL 194 11/22/2022   TRIG 175.0 (H) 11/22/2022   HDL 55.80 11/22/2022   LDLCALC 103 (H) 11/22/2022   ALT 19 11/22/2022  AST 31 11/22/2022   NA 139 11/22/2022   K 4.1 11/22/2022   CL 102 11/22/2022   CREATININE 1.02 11/22/2022   BUN 15 11/22/2022   CO2 29 11/22/2022   TSH 3.54 11/22/2022   INR 1.15 12/18/2015   HGBA1C 5.8 11/22/2022    MM 3D SCREEN BREAST BILATERAL  Result Date: 03/05/2021 CLINICAL DATA:  Screening. EXAM: DIGITAL SCREENING BILATERAL MAMMOGRAM WITH TOMOSYNTHESIS AND CAD TECHNIQUE: Bilateral screening digital craniocaudal and mediolateral oblique mammograms were obtained. Bilateral screening digital breast tomosynthesis was performed. The images were evaluated with computer-aided detection. COMPARISON:  Previous exam(s). ACR Breast Density Category b: There are scattered areas of fibroglandular density. FINDINGS: There are no findings suspicious for malignancy. IMPRESSION: No mammographic evidence of malignancy. A result letter of this screening mammogram will be mailed directly to the patient. RECOMMENDATION: Screening mammogram in one year. (Code:SM-B-01Y) BI-RADS CATEGORY  1: Negative. Electronically Signed   By: Harmon Pier M.D.   On: 03/05/2021 08:57    Assessment & Plan:   Age-related osteoporosis without current pathological fracture -     Basic metabolic panel; Future -     Phosphorus; Future -     VITAMIN D 25 Hydroxy (Vit-D Deficiency, Fractures); Future  Gastroesophageal reflux disease with esophagitis without hemorrhage- Sx's are well controlled. -     CBC with Differential/Platelet; Future  Major depressive disorder in  partial remission, unspecified whether recurrent (HCC)  Chronic hyperglycemia -     Hemoglobin A1c; Future -     Basic metabolic panel; Future  Essential hypertension- Her BP is well controlled. -     TSH; Future -     Hepatic function panel; Future  Hyperlipidemia with target LDL less than 100 - LDL goal achieved. Doing well on the statin  -     Lipid panel; Future -     TSH; Future -     Hepatic function panel; Future -     Atorvastatin Calcium; Take 1 tablet (40 mg total) by mouth daily.  Dispense: 90 tablet; Refill: 0  Encounter for general adult medical examination with abnormal findings- Exam completed, labs reviewed, vaccines reviewed and updated, no cancer screenings indicated, patient education was given. -     Boostrix; Inject 0.5 mLs into the muscle once for 1 dose.  Dispense: 0.5 mL; Refill: 0  Estrogen deficiency -     DG Bone Density; Future  Stage 3b chronic kidney disease (HCC)- Will avoid nephrotoxic agents  Other orders -     Shingrix; Inject 0.5 mLs into the muscle once for 1 dose.  Dispense: 0.5 mL; Refill: 1     Follow-up: Return in about 6 months (around 05/24/2023).  Sanda Linger, MD

## 2022-11-23 DIAGNOSIS — E2839 Other primary ovarian failure: Secondary | ICD-10-CM | POA: Insufficient documentation

## 2022-11-23 DIAGNOSIS — N1832 Chronic kidney disease, stage 3b: Secondary | ICD-10-CM | POA: Insufficient documentation

## 2022-11-23 MED ORDER — ATORVASTATIN CALCIUM 40 MG PO TABS: 40.0000 mg | ORAL_TABLET | Freq: Every day | ORAL | 0 refills | Status: DC

## 2022-11-29 ENCOUNTER — Other Ambulatory Visit: Payer: Self-pay | Admitting: Pulmonary Disease

## 2023-01-07 ENCOUNTER — Other Ambulatory Visit: Payer: Self-pay | Admitting: Internal Medicine

## 2023-01-07 DIAGNOSIS — E785 Hyperlipidemia, unspecified: Secondary | ICD-10-CM

## 2023-01-07 DIAGNOSIS — I10 Essential (primary) hypertension: Secondary | ICD-10-CM

## 2023-01-07 DIAGNOSIS — K2100 Gastro-esophageal reflux disease with esophagitis, without bleeding: Secondary | ICD-10-CM

## 2023-01-07 MED ORDER — ATORVASTATIN CALCIUM 40 MG PO TABS: 40.0000 mg | ORAL_TABLET | Freq: Every day | ORAL | 1 refills | Status: DC

## 2023-02-26 ENCOUNTER — Other Ambulatory Visit: Payer: Self-pay | Admitting: Internal Medicine

## 2023-02-26 DIAGNOSIS — K2100 Gastro-esophageal reflux disease with esophagitis, without bleeding: Secondary | ICD-10-CM

## 2023-02-26 DIAGNOSIS — I10 Essential (primary) hypertension: Secondary | ICD-10-CM

## 2023-03-21 ENCOUNTER — Other Ambulatory Visit: Payer: Self-pay | Admitting: Internal Medicine

## 2023-03-21 DIAGNOSIS — I10 Essential (primary) hypertension: Secondary | ICD-10-CM

## 2023-05-23 ENCOUNTER — Ambulatory Visit: Payer: 59 | Admitting: Internal Medicine

## 2023-06-03 ENCOUNTER — Emergency Department (HOSPITAL_COMMUNITY): Payer: 59

## 2023-06-03 ENCOUNTER — Other Ambulatory Visit: Payer: Self-pay

## 2023-06-03 ENCOUNTER — Ambulatory Visit (INDEPENDENT_AMBULATORY_CARE_PROVIDER_SITE_OTHER): Payer: 59 | Admitting: Internal Medicine

## 2023-06-03 ENCOUNTER — Telehealth: Payer: Self-pay | Admitting: Radiology

## 2023-06-03 ENCOUNTER — Encounter: Payer: Self-pay | Admitting: Internal Medicine

## 2023-06-03 ENCOUNTER — Encounter (HOSPITAL_COMMUNITY): Payer: Self-pay

## 2023-06-03 ENCOUNTER — Observation Stay (HOSPITAL_COMMUNITY)
Admission: EM | Admit: 2023-06-03 | Discharge: 2023-06-05 | Disposition: A | Discharge status: Home / Self Care | Payer: 59 | Attending: Internal Medicine | Admitting: Internal Medicine

## 2023-06-03 VITALS — BP 152/74 | HR 50 | Temp 97.6°F | Resp 16 | Ht 59.0 in | Wt 116.0 lb

## 2023-06-03 DIAGNOSIS — Z23 Encounter for immunization: Secondary | ICD-10-CM | POA: Diagnosis not present

## 2023-06-03 DIAGNOSIS — Z7982 Long term (current) use of aspirin: Secondary | ICD-10-CM | POA: Insufficient documentation

## 2023-06-03 DIAGNOSIS — R531 Weakness: Secondary | ICD-10-CM

## 2023-06-03 DIAGNOSIS — I5032 Chronic diastolic (congestive) heart failure: Secondary | ICD-10-CM

## 2023-06-03 DIAGNOSIS — R7989 Other specified abnormal findings of blood chemistry: Principal | ICD-10-CM

## 2023-06-03 DIAGNOSIS — R5383 Other fatigue: Secondary | ICD-10-CM

## 2023-06-03 DIAGNOSIS — N1832 Chronic kidney disease, stage 3b: Secondary | ICD-10-CM

## 2023-06-03 DIAGNOSIS — R001 Bradycardia, unspecified: Secondary | ICD-10-CM | POA: Diagnosis not present

## 2023-06-03 DIAGNOSIS — J4520 Mild intermittent asthma, uncomplicated: Secondary | ICD-10-CM | POA: Diagnosis not present

## 2023-06-03 DIAGNOSIS — E785 Hyperlipidemia, unspecified: Secondary | ICD-10-CM | POA: Insufficient documentation

## 2023-06-03 DIAGNOSIS — E876 Hypokalemia: Secondary | ICD-10-CM | POA: Diagnosis not present

## 2023-06-03 DIAGNOSIS — Z7951 Long term (current) use of inhaled steroids: Secondary | ICD-10-CM | POA: Insufficient documentation

## 2023-06-03 DIAGNOSIS — Z79899 Other long term (current) drug therapy: Secondary | ICD-10-CM | POA: Insufficient documentation

## 2023-06-03 DIAGNOSIS — R9431 Abnormal electrocardiogram [ECG] [EKG]: Principal | ICD-10-CM | POA: Insufficient documentation

## 2023-06-03 DIAGNOSIS — I10 Essential (primary) hypertension: Secondary | ICD-10-CM

## 2023-06-03 DIAGNOSIS — R778 Other specified abnormalities of plasma proteins: Principal | ICD-10-CM | POA: Insufficient documentation

## 2023-06-03 DIAGNOSIS — I110 Hypertensive heart disease with heart failure: Secondary | ICD-10-CM | POA: Insufficient documentation

## 2023-06-03 LAB — URINALYSIS, ROUTINE W REFLEX MICROSCOPIC
Bilirubin Urine: NEGATIVE
Hgb urine dipstick: NEGATIVE
Ketones, ur: NEGATIVE
Nitrite: NEGATIVE
Specific Gravity, Urine: >=1.03 — AB (ref 1.000–1.030)
Total Protein, Urine: NEGATIVE
Urine Glucose: NEGATIVE
Urobilinogen, UA: 0.2 (ref 0.0–1.0)
pH: 5.5 (ref 5.0–8.0)

## 2023-06-03 LAB — BASIC METABOLIC PANEL
Anion gap: 6 (ref 5–15)
BUN: 15 mg/dL (ref 6–23)
BUN: 13 mg/dL (ref 8–23)
CO2: 30 meq/L (ref 19–32)
CO2: 28 mmol/L (ref 22–32)
Calcium: 9.7 mg/dL (ref 8.4–10.5)
Calcium: 9.4 mg/dL (ref 8.9–10.3)
Chloride: 102 meq/L (ref 96–112)
Chloride: 104 mmol/L (ref 98–111)
Creatinine, Ser: 0.97 mg/dL (ref 0.40–1.20)
Creatinine, Ser: 1.04 mg/dL — ABNORMAL HIGH (ref 0.44–1.00)
GFR, Estimated: 55 mL/min — ABNORMAL LOW (ref >60)
GFR: 55.51 mL/min — ABNORMAL LOW (ref >60.00)
Glucose, Bld: 87 mg/dL (ref 70–99)
Glucose, Bld: 138 mg/dL — ABNORMAL HIGH (ref 70–99)
Potassium: 4.3 meq/L (ref 3.5–5.1)
Potassium: 3.2 mmol/L — ABNORMAL LOW (ref 3.5–5.1)
Sodium: 141 meq/L (ref 135–145)
Sodium: 138 mmol/L (ref 135–145)

## 2023-06-03 LAB — CBC
HCT: 37.6 % (ref 36.0–46.0)
Hemoglobin: 12.6 g/dL (ref 12.0–15.0)
MCH: 30.5 pg (ref 26.0–34.0)
MCHC: 33.5 g/dL (ref 30.0–36.0)
MCV: 91 fL (ref 80.0–100.0)
Platelets: 313 10*3/uL (ref 150–400)
RBC: 4.13 MIL/uL (ref 3.87–5.11)
RDW: 13.2 % (ref 11.5–15.5)
WBC: 6.4 10*3/uL (ref 4.0–10.5)
nRBC: 0 % (ref 0.0–0.2)

## 2023-06-03 LAB — CBC WITH DIFFERENTIAL/PLATELET
Basophils Absolute: 0.1 10*3/uL (ref 0.0–0.1)
Basophils Relative: 1.1 % (ref 0.0–3.0)
Eosinophils Absolute: 0.1 10*3/uL (ref 0.0–0.7)
Eosinophils Relative: 0.9 % (ref 0.0–5.0)
HCT: 39.7 % (ref 36.0–46.0)
Hemoglobin: 13.5 g/dL (ref 12.0–15.0)
Lymphocytes Relative: 30.4 % (ref 12.0–46.0)
Lymphs Abs: 2.1 10*3/uL (ref 0.7–4.0)
MCHC: 34.1 g/dL (ref 30.0–36.0)
MCV: 92.2 fL (ref 78.0–100.0)
Monocytes Absolute: 0.7 10*3/uL (ref 0.1–1.0)
Monocytes Relative: 9.7 % (ref 3.0–12.0)
Neutro Abs: 4 10*3/uL (ref 1.4–7.7)
Neutrophils Relative %: 57.9 % (ref 43.0–77.0)
Platelets: 300 10*3/uL (ref 150.0–400.0)
RBC: 4.3 Mil/uL (ref 3.87–5.11)
RDW: 13.8 % (ref 11.5–15.5)
WBC: 6.9 10*3/uL (ref 4.0–10.5)

## 2023-06-03 LAB — HEPATIC FUNCTION PANEL
ALT: 16 U/L (ref 0–35)
AST: 29 U/L (ref 0–37)
Albumin: 4.4 g/dL (ref 3.5–5.2)
Alkaline Phosphatase: 102 U/L (ref 39–117)
Bilirubin, Direct: 0.3 mg/dL (ref 0.0–0.3)
Total Bilirubin: 1.9 mg/dL — ABNORMAL HIGH (ref 0.2–1.2)
Total Protein: 7.4 g/dL (ref 6.0–8.3)

## 2023-06-03 LAB — TROPONIN I (HIGH SENSITIVITY)
High Sens Troponin I: 40 ng/L (ref 2–17)
Troponin I (High Sensitivity): 39 ng/L — ABNORMAL HIGH (ref <18)
Troponin I (High Sensitivity): 41 ng/L — ABNORMAL HIGH (ref <18)

## 2023-06-03 LAB — TSH: TSH: 2.713 u[IU]/mL (ref 0.350–4.500)

## 2023-06-03 LAB — BRAIN NATRIURETIC PEPTIDE: Pro B Natriuretic peptide (BNP): 123 pg/mL — ABNORMAL HIGH (ref 0.0–100.0)

## 2023-06-03 MED ORDER — HYDRALAZINE HCL 20 MG/ML IJ SOLN: 10.0000 mg | INTRAMUSCULAR | Status: DC | PRN

## 2023-06-03 MED ORDER — MELATONIN 3 MG PO TABS: 3.0000 mg | ORAL_TABLET | Freq: Every evening | ORAL | Status: DC | PRN

## 2023-06-03 MED ORDER — POTASSIUM CHLORIDE 20 MEQ PO PACK: 40.0000 meq | PACK | Freq: Once | ORAL | Status: DC

## 2023-06-03 MED ORDER — ACETAMINOPHEN 325 MG PO TABS: 650.0000 mg | ORAL_TABLET | Freq: Four times a day (QID) | ORAL | Status: DC | PRN

## 2023-06-03 MED ORDER — ACETAMINOPHEN 650 MG RE SUPP: 650.0000 mg | Freq: Four times a day (QID) | RECTAL | Status: DC | PRN

## 2023-06-03 MED ORDER — POTASSIUM CHLORIDE 20 MEQ PO PACK
40.0000 meq | PACK | Freq: Once | ORAL | Status: AC
  Administered 2023-06-04: 40 meq via ORAL
  Filled 2023-06-03: qty 2

## 2023-06-03 MED ORDER — ALBUTEROL SULFATE (2.5 MG/3ML) 0.083% IN NEBU: 2.5000 mg | INHALATION_SOLUTION | RESPIRATORY_TRACT | Status: DC | PRN

## 2023-06-03 MED ORDER — HYDRALAZINE HCL 25 MG PO TABS
25.0000 mg | ORAL_TABLET | Freq: Once | ORAL | Status: AC
  Administered 2023-06-03: 25 mg via ORAL
  Filled 2023-06-03: qty 1

## 2023-06-03 MED ORDER — ONDANSETRON HCL 4 MG/2ML IJ SOLN: 4.0000 mg | Freq: Four times a day (QID) | INTRAMUSCULAR | Status: DC | PRN

## 2023-06-03 MED ORDER — ASPIRIN 81 MG PO CHEW
324.0000 mg | CHEWABLE_TABLET | Freq: Once | ORAL | Status: AC
  Administered 2023-06-03: 324 mg via ORAL
  Filled 2023-06-03: qty 4

## 2023-06-03 NOTE — ED Notes (Signed)
CCMD called to add to monitor

## 2023-06-03 NOTE — ED Triage Notes (Signed)
Pt referred to ED from PCP's office. Pt reportedly was having visit for fatigue, weakness, memory issues. Pt had EKG and was noted to be bradycardic. Pt also had elevated BNP and troponins. Pt denies CP, SOB.

## 2023-06-03 NOTE — Telephone Encounter (Signed)
Spoke to patient son and advised him to take her to the ED they understood and is going to [facility]

## 2023-06-03 NOTE — Consult Note (Signed)
Cardiology Consultation:   Patient ID: LOLAH TAMES; [id number]; Oct 12, 1945   Admit date: 06/03/2023 Date of Consult: 06/03/2023  Primary Care Provider: Etta Grandchild, MD Primary Cardiologist: None  Primary Electrophysiologist:  nONE   Patient Profile:   Brenda Bautista is a 77 y.o. female without cardiac history who is being seen today for the evaluation of abnormal cardiac enzymes  at the request of Dr. Eloise Harman. Marland Kitchen  History of Present Illness:   Ms. Herzberger who was seen by her PCP today and had complaints of fatigue, and memory disorder.  She had some vague T wave inversion in the EKG that by my review are not significantly different than previous.  Her PCP drew enzymes that were mildly abnormal and flat.    BNP was borderline at 123.  She was instructed to present to the ED.  On presentation there were no abnormalities on the CXR.    She has a history of having a cath in 2017 and had no CAD reported.   She is here with her son and daughter in law who are quite agitated at the bedside because the the patient is not compliant with medical appointments and medications.  She does not take her medications because she feels good.  She cancels medical appointments because she feels good.  They say she is having some progressive memory disorder.  She lives by herself.  She does her own chores.  She does some vacuuming as recently as Christmas.  She denies any chest pressure, neck or arm discomfort.  She does not have any palpitations, presyncope or syncope.  Her son reports that she does have some wobbliness.  She has not had any falls or loss of consciousness by her report.  She sleeps on a couch because her bed is not comfortable.  She is not describing PND or orthopnea.  Past Medical History:  Diagnosis Date   Allergic rhinitis    Anxiety    Arthritis    Asthma    Cataract    Chicken pox    Depression    Hyperlipemia    Hypertension    Sinusitis    Vitamin D  deficiency     Past Surgical History:  Procedure Laterality Date   CARDIAC CATHETERIZATION N/A 12/19/2015   Procedure: Left Heart Cath and Coronary Angiography;  Surgeon: Marykay Lex, MD;  Location: [facility] INVASIVE CV LAB;  Service: Cardiovascular;  Laterality: N/A;   CATARACT EXTRACTION, BILATERAL Right 06/2015   CATARACT EXTRACTION, BILATERAL Left 07/2015   TRIGGER FINGER RELEASE     WISDOM TOOTH EXTRACTION       Home Medications:  Prior to Admission medications   Medication Sig Start Date End Date Taking? Authorizing Provider  albuterol (PROVENTIL HFA;VENTOLIN HFA) 108 (90 Base) MCG/ACT inhaler Inhale 2 puffs into the lungs every 6 (six) hours as needed for wheezing or shortness of breath.    [provider]  ALPRAZolam Prudy Feeler) 0.25 MG tablet Take 1 tablet (0.25 mg total) by mouth 3 (three) times daily as needed for anxiety. Patient not taking: Reported on 06/03/2023 04/16/21   Etta Grandchild, MD  aspirin 81 MG tablet Take 81 mg by mouth daily.    [provider]  atorvastatin (LIPITOR) 40 MG tablet Take 1 tablet (40 mg total) by mouth daily. Patient not taking: Reported on 06/03/2023 01/07/23   Etta Grandchild, MD  azelastine (ASTELIN) 0.1 % nasal spray Place 2 sprays into both nostrils 2 (two) times  daily. Use in each nostril as directed Patient not taking: Reported on 06/03/2023 05/20/17   Chilton Greathouse, MD  budesonide-formoterol (SYMBICORT) 80-4.5 MCG/ACT inhaler INHALE 2 PUFFS INTO THE LUNGS IN THE MORNING AND AT BEDTIME. 11/29/22   Mannam, Praveen, MD  calcium carbonate 1250 MG capsule Take 1,250 mg by mouth daily.    [provider]  carboxymethylcellulose (REFRESH PLUS) 0.5 % SOLN Place 1 drop into both eyes at bedtime.    [provider]  cholecalciferol (VITAMIN D) 1000 units tablet Take 1,000 Units by mouth daily. Patient not taking: Reported on 06/03/2023    [provider]  FLUoxetine (PROZAC) 40 MG capsule TAKE 1 CAPSULE (40 MG  TOTAL) BY MOUTH DAILY. Patient not taking: Reported on 06/03/2023 10/12/21   Etta Grandchild, MD  fluticasone (FLONASE) 50 MCG/ACT nasal spray PLACE 2 SPRAYS INTO BOTH NOSTRILS DAILY. Patient not taking: Reported on 06/03/2023 02/17/17   Chilton Greathouse, MD  montelukast (SINGULAIR) 10 MG tablet TAKE 1 TABLET BY MOUTH EVERY DAY Patient not taking: Reported on 06/03/2023 09/27/18   Chilton Greathouse, MD  Multiple Vitamins-Minerals (MULTIVITAMIN WITH MINERALS) tablet Take 1 tablet by mouth daily. Patient not taking: Reported on 06/03/2023    [provider]  Probiotic Product (PROBIOTIC ADVANCED PO) Take 1 capsule by mouth daily. Patient not taking: Reported on 06/03/2023    [provider]  REXULTI 0.25 MG TABS tablet TAKE 1 TABLET BY MOUTH EVERY DAY Patient not taking: Reported on 06/03/2023 01/27/22   Etta Grandchild, MD  risedronate (ACTONEL) 35 MG tablet TAKE 1 TABLET EVERY 7 DAYS W/WATER ON EMPTY STOMACH, NOTHING BY MOUTH OR LIE DOWN FOR NEXT Patient not taking: Reported on 06/03/2023 10/20/21   Etta Grandchild, MD    Inpatient Medications: Scheduled Meds:  Continuous Infusions:  PRN Meds:   Allergies:    Allergies  Allergen Reactions   Codeine    Bactrim [Sulfamethoxazole-Trimethoprim] Rash   Flagyl [Metronidazole] Rash   Meloxicam Other (See Comments)    Abdominal pain     Social History:   Social History   Socioeconomic History   Marital status: Widowed    Spouse name: Not on file   Number of children: Not on file   Years of education: Not on file   Highest education level: Not on file  Occupational History   Not on file  Tobacco Use   Smoking status: Never   Smokeless tobacco: Never  Vaping Use   Vaping status: Never Used  Substance and Sexual Activity   Alcohol use: No   Drug use: No   Sexual activity: Not on file  Other Topics Concern   Not on file  Social History Narrative   Widowed, spouse Gerlene Burdock passed away July 03, 2014 (he was a  former patient of Dr Vassie Loll and Rubye Oaks NP)   5 children, all live in [place]   OCCUPATION: retired, worked in a Best boy   Social Drivers of Corporate investment banker Strain: Not on Ship broker Insecurity: Not on file  Transportation Needs: Not on file  Physical Activity: Not on file  Stress: Not on file  Social Connections: Not on file  Intimate Partner Violence: Not on file    Family History:    Family History  Problem Relation Age of Onset   Coronary artery disease Mother 69       MI in early 63s   Sick sinus syndrome Mother        required PPM  Depression Mother    Heart attack Mother    Hyperlipidemia Mother    Hypertension Mother    Stroke Mother    Emphysema Father    Asthma Father    Heart attack Father    COPD Father    Hypertension Father    Hyperlipidemia Father    COPD Brother    Hyperlipidemia Brother    Hypertension Brother    Hearing loss Daughter    Breast cancer Daughter    Mitral valve prolapse Son      ROS:  Please see the history of present illness.  As stated in the HPI and negative for all other systems.   Physical Exam/Data:   Vitals:   06/03/23 1422 06/03/23 1423 06/03/23 1830 06/03/23 1836  BP: (!) 165/64  (!) 185/74   Pulse: 63  (!) 51   Resp: 18  11   Temp: 98 F (36.7 C)   97.9 F (36.6 C)  TempSrc: Oral   Oral  SpO2: 98%  100%   Weight:  52.6 kg    Height:  4\' 11"  (1.499 m)     No intake or output data in the 24 hours ending 06/03/23 Paulo Fruit Filed Weights   06/03/23 1423  Weight: 52.6 kg   Body mass index is 23.43 kg/m.  GENERAL:  Wellappearing HEENT:   Pupils equal round and reactive, fundi not visualized, oral mucosa unremarkable NECK:  No  jugular venous distention, waveform within normal limits, carotid upstroke brisk and symmetric, no bruits, no thyromegaly LYMPHATICS:  No cervical, inguinal adenopathy LUNGS:   Clear to auscultation bilaterally BACK:  No CVA tenderness CHEST:   Unremarkable HEART:  PMI  not displaced or sustained,S1 and S2 within normal limits, no S3, no S4, no clicks, no rubs, 2/6 brief apical systolic murmur, no diastolic  murmurs ABD:  Flat, positive bowel sounds normal in frequency in pitch, no bruits, no rebound, no guarding, no midline pulsatile mass, no hepatomegaly, no splenomegaly EXT:  2 plus pulses throughout, no  edema, no cyanosis no clubbing SKIN:  No rashes no nodules NEURO:   Cranial nerves II through XII grossly intact, motor grossly intact throughout PSYCH:    Cognitively intact, oriented to person place and time   EKG:  The EKG was personally reviewed and demonstrates:  SB, rate 59, axis WNL, non specific lateral T wave inversion.   Telemetry:  Telemetry was personally reviewed and demonstrates:  NA  Relevant CV Studies: None  Laboratory Data:  Chemistry Recent Labs  Lab 06/03/23 1102 06/03/23 1425  NA 141 138  K 4.3 3.2*  CL 102 104  CO2 30 28  GLUCOSE 87 138*  BUN 15 13  CREATININE 0.97 1.04*  CALCIUM 9.7 9.4  GFRNONAA  --  55*  ANIONGAP  --  6    Recent Labs  Lab 06/03/23 1102  PROT 7.4  ALBUMIN 4.4  AST 29  ALT 16  ALKPHOS 102  BILITOT 1.9*   Hematology Recent Labs  Lab 06/03/23 1102 06/03/23 1425  WBC 6.9 6.4  RBC 4.30 4.13  HGB 13.5 12.6  HCT 39.7 37.6  MCV 92.2 91.0  MCH  --  30.5  MCHC 34.1 33.5  RDW 13.8 13.2  PLT 300.0 313   Cardiac EnzymesNo results for input(s): "TROPONINI" in the last 168 hours. No results for input(s): "TROPIPOC" in the last 168 hours.  BNP Recent Labs  Lab 06/03/23 1102  PROBNP 123.0*    DDimer No results for input(s): "DDIMER"  in the last 168 hours.  Radiology/Studies:  DG Chest 2 View Result Date: 06/03/2023 CLINICAL DATA:  Chest pain. EXAM: CHEST - 2 VIEW COMPARISON:  08/18/2020. FINDINGS: There are mildly increased interstitial markings, which are nonspecific. No overt pulmonary edema. Bilateral lung fields are clear. No acute consolidation or lung collapse. Bilateral  costophrenic angles are clear. Normal cardio-mediastinal silhouette. No acute osseous abnormalities. The soft tissues are within normal limits. IMPRESSION: No active cardiopulmonary disease. Electronically Signed   By: Jules Schick M.D.   On: 06/03/2023 17:06    Assessment and Plan:   Elevated cardiac enzymes:  Flat and non diagnostic in the absence of diagnostic EKG changes and no cardiovascular complaints.  I am not suspecting an acute coronary syndrome.   Repeat an EKG in the AM.  No in patient work up planned other than below.  Murmur:  I suspect mild aortic stenosis.  Can order echo while she is here since she does not comply with follow up appts.    Memory disorder:  Work up per primary team.  Might need neurology referral as an outpatient.   Hypertension:  Surprisingly she is not on any out patient antihypertensives.  I would suggest Norvasc 5 mg daily.  Avoid AV nodal blocking agents and ACE/ARB as first choice given some CKD and bradycardia.  Bradycardia:   No symptoms.  Would be prudent to follow three day Zio at discharge.  We have not yet arranged this.     For questions or updates, please contact [facility] Please consult [email or web address] for contact info under Cardiology/STEMI.   Signed, Rollene Rotunda, MD  06/03/2023 6:38 PM

## 2023-06-03 NOTE — H&P (Signed)
History and Physical      Brenda Bautista:[id number] DOB: 01-07-1946 DOA: 06/03/2023; DOS: 06/03/2023  PCP: Etta Grandchild, MD  Patient coming from: home   I have personally briefly reviewed patient's old medical records in [facility]  Chief Complaint: generalized weakness  HPI: Brenda Bautista is a 77 y.o. female with medical history significant for essential hypertension, hyperlipidemia, chronic diastolic heart failure, mild intermittent asthma, who is admitted to [facility] on 06/03/2023 with elevated troponin after presenting from home to [facility] ED complaining of generalized weakness.   The patient presented to the office of her PCP earlier today for evaluation of 1 week of generalized weakness in the absence of any acute focal weakness.  She denies any associated subjective fever, chills, rigors, or generalized myalgias.  Not associate with any recent headache, neck stiffness, shortness of breath, cough, abdominal pain, diarrhea, dysuria or gross hematuria.  She also denies any recent chest pain, palpitations, diaphoresis, nausea, vomiting, dizziness, presyncope, or syncope.  She has no reported known history of coronary artery disease.  Per chart review, it appears that she underwent her most recent coronary angiography in July 2017 in the form of left-sided heart cath at that time.  Additionally, per chart review, most recent prior echocardiogram occurred in July 2017, it was notable for LVEF 66 5%, no evidence of focal wall motion abnormalities, will demonstrating evidence of grade 1 diastolic dysfunction.  In the setting of her generalized weakness, her PCP initiated associated outpatient workup that included urinalysis that showed 7-10 white blood cells, few bacteria, nitrate negative findings.  PCP also ordered high sensitive troponin I was found to be 40, with EKG performed at PCPs office reportedly showing some nonspecific T wave and ST changes, in the  absence of any evidence of ST elevation.  In the setting of the patient's generalized weakness with mildly elevated troponin and nonspecific EKG changes, PCP recommended to the patient that she present to the emergency department for further evaluation management thereof.     ED Course:  Vital signs in the ED were notable for the following: Afebrile; heart rates in the 50s to 60s; systolic blood pressures in the 160s to 190s; respiratory rate 15-18, oxygen saturation 98% on room air.  Labs were notable for the following: BMP was notable for the following: Sodium 138, potassium 3.2, creatinine 1.04 compared to most recent prior serum creatinine did point of 1.02 in June 2024, glucose 138.  High sensitive troponin I 39, with repeat value trending up slightly to 41.  CBC notable for white cell count 6400, hemoglobin 12.6.  Per my interpretation, EKG in ED demonstrated the following: In comparison to most recent prior EKG from 03/17/2021, today's EKG shows sinus bradycardia with heart rate 59, normal intervals, no evidence of T wave changes, while showing nonspecific less than 1 mm ST depression in V4, V5, which appears new relative to most recent prior EKG, showing no evidence of additional ST changes, including no evidence of ST elevation.  Imaging in the ED, per corresponding formal radiology read, was notable for the following: 2 view chest x-ray shows no evidence of acute cardiopulmonary process, including no evidence of Melika Reder, edema, effusion, or pneumothorax.  EDP discussed patient's case with on-call cardiology, Dr. Antoine Poche, who will formally consult and see the patient this evening.   While in the ED, the following were administered: Full dose aspirin x 1, hydralazine 25 mg p.o. x 1 dose.  Subsequently, the patient was  admitted for further evaluation management of presenting mildly elevated troponin in the setting of patient's report of generalized weakness, with additional labs notable  for mild hypokalemia.     Review of Systems: As per HPI otherwise 10 point review of systems negative.   Past Medical History:  Diagnosis Date   Allergic rhinitis    Anxiety    Arthritis    Asthma    Cataract    Depression    Hyperlipemia    Hypertension    Sinusitis    Vitamin D deficiency     Past Surgical History:  Procedure Laterality Date   CARDIAC CATHETERIZATION N/A 12/19/2015   Procedure: Left Heart Cath and Coronary Angiography;  Surgeon: Marykay Lex, MD;  Location: [facility] INVASIVE CV LAB;  Service: Cardiovascular;  Laterality: N/A;   CATARACT EXTRACTION, BILATERAL Right 06/2015   CATARACT EXTRACTION, BILATERAL Left 07/2015   TRIGGER FINGER RELEASE     WISDOM TOOTH EXTRACTION      Social History:  reports that she has never smoked. She has never used smokeless tobacco. She reports that she does not drink alcohol and does not use drugs.   Allergies  Allergen Reactions   Codeine    Bactrim [Sulfamethoxazole-Trimethoprim] Rash   Flagyl [Metronidazole] Rash   Meloxicam Other (See Comments)    Abdominal pain     Family History  Problem Relation Age of Onset   Coronary artery disease Mother 47       MI in early 79s   Sick sinus syndrome Mother        required PPM   Depression Mother    Heart attack Mother    Hyperlipidemia Mother    Hypertension Mother    Stroke Mother    Emphysema Father    Asthma Father    Heart attack Father    COPD Father    Hypertension Father    Hyperlipidemia Father    COPD Brother    Hyperlipidemia Brother    Hypertension Brother    Hearing loss Daughter    Breast cancer Daughter    Mitral valve prolapse Son     Family history reviewed and not pertinent    Prior to Admission medications   Medication Sig Start Date End Date Taking? Authorizing Provider  albuterol (PROVENTIL HFA;VENTOLIN HFA) 108 (90 Base) MCG/ACT inhaler Inhale 2 puffs into the lungs every 6 (six) hours as needed for wheezing or shortness of breath.     [provider]  ALPRAZolam Prudy Feeler) 0.25 MG tablet Take 1 tablet (0.25 mg total) by mouth 3 (three) times daily as needed for anxiety. Patient not taking: Reported on 06/03/2023 04/16/21   Etta Grandchild, MD  aspirin 81 MG tablet Take 81 mg by mouth daily.    [provider]  atorvastatin (LIPITOR) 40 MG tablet Take 1 tablet (40 mg total) by mouth daily. Patient not taking: Reported on 06/03/2023 01/07/23   Etta Grandchild, MD  azelastine (ASTELIN) 0.1 % nasal spray Place 2 sprays into both nostrils 2 (two) times daily. Use in each nostril as directed Patient not taking: Reported on 06/03/2023 05/20/17   Chilton Greathouse, MD  budesonide-formoterol (SYMBICORT) 80-4.5 MCG/ACT inhaler INHALE 2 PUFFS INTO THE LUNGS IN THE MORNING AND AT BEDTIME. 11/29/22   Mannam, Praveen, MD  calcium carbonate 1250 MG capsule Take 1,250 mg by mouth daily.    [provider]  carboxymethylcellulose (REFRESH PLUS) 0.5 % SOLN Place 1 drop into both eyes at bedtime.  [provider]  cholecalciferol (VITAMIN D) 1000 units tablet Take 1,000 Units by mouth daily. Patient not taking: Reported on 06/03/2023    [provider]  FLUoxetine (PROZAC) 40 MG capsule TAKE 1 CAPSULE (40 MG TOTAL) BY MOUTH DAILY. Patient not taking: Reported on 06/03/2023 10/12/21   Etta Grandchild, MD  fluticasone (FLONASE) 50 MCG/ACT nasal spray PLACE 2 SPRAYS INTO BOTH NOSTRILS DAILY. Patient not taking: Reported on 06/03/2023 02/17/17   Chilton Greathouse, MD  montelukast (SINGULAIR) 10 MG tablet TAKE 1 TABLET BY MOUTH EVERY DAY Patient not taking: Reported on 06/03/2023 09/27/18   Chilton Greathouse, MD  Multiple Vitamins-Minerals (MULTIVITAMIN WITH MINERALS) tablet Take 1 tablet by mouth daily. Patient not taking: Reported on 06/03/2023    [provider]  Probiotic Product (PROBIOTIC ADVANCED PO) Take 1 capsule by mouth daily. Patient not taking: Reported on 06/03/2023    [provider]  REXULTI 0.25 MG TABS tablet TAKE 1 TABLET BY MOUTH EVERY DAY Patient not taking: Reported on 06/03/2023 01/27/22   Etta Grandchild, MD  risedronate (ACTONEL) 35 MG tablet TAKE 1 TABLET EVERY 7 DAYS W/WATER ON EMPTY STOMACH, NOTHING BY MOUTH OR LIE DOWN FOR NEXT Patient not taking: Reported on 06/03/2023 10/20/21   Etta Grandchild, MD     Objective    Physical Exam: Vitals:   06/03/23 1830 06/03/23 1836 06/03/23 1852 06/03/23 1900  BP: (!) 185/74  (!) 188/78 (!) 196/70  Pulse: (!) 51   (!) 49  Resp: 11   15  Temp:  97.9 F (36.6 C)    TempSrc:  Oral    SpO2: 100%   98%  Weight:      Height:        General: appears to be stated age; alert, oriented Skin: warm, dry, no rash Head:  AT/[place] Mouth:  Oral mucosa membranes appear moist, normal dentition Neck: supple; trachea midline Heart:  RRR; did not appreciate any M/R/G Lungs: CTAB, did not appreciate any wheezes, rales, or rhonchi Abdomen: + BS; soft, ND, NT Vascular: 2+ pedal pulses b/l; 2+ radial pulses b/l Extremities: no peripheral edema, no muscle wasting Neuro: strength and sensation intact in upper and lower extremities b/l     Labs on Admission: I have personally reviewed following labs and imaging studies  CBC: Recent Labs  Lab 06/03/23 1102 06/03/23 1425  WBC 6.9 6.4  NEUTROABS 4.0  --   HGB 13.5 12.6  HCT 39.7 37.6  MCV 92.2 91.0  PLT 300.0 313   Basic Metabolic Panel: Recent Labs  Lab 06/03/23 1102 06/03/23 1425  NA 141 138  K 4.3 3.2*  CL 102 104  CO2 30 28  GLUCOSE 87 138*  BUN 15 13  CREATININE 0.97 1.04*  CALCIUM 9.7 9.4   GFR: Estimated Creatinine Clearance: 32.5 mL/min (A) (by C-G formula based on SCr of 1.04 mg/dL (H)). Liver Function Tests: Recent Labs  Lab 06/03/23 1102  AST 29  ALT 16  ALKPHOS 102  BILITOT 1.9*  PROT 7.4  ALBUMIN 4.4   No results for input(s): "LIPASE", "AMYLASE" in the last 168 hours. No results for input(s): "AMMONIA" in the last 168  hours. Coagulation Profile: No results for input(s): "INR", "PROTIME" in the last 168 hours. Cardiac Enzymes: No results for input(s): "CKTOTAL", "CKMB", "CKMBINDEX", "TROPONINI" in the last 168 hours. BNP (last 3 results) Recent Labs    06/03/23 1102  PROBNP 123.0*   HbA1C: No results for input(s): "HGBA1C" in the  last 72 hours. CBG: No results for input(s): "GLUCAP" in the last 168 hours. Lipid Profile: No results for input(s): "CHOL", "HDL", "LDLCALC", "TRIG", "CHOLHDL", "LDLDIRECT" in the last 72 hours. Thyroid Function Tests: No results for input(s): "TSH", "T4TOTAL", "FREET4", "T3FREE", "THYROIDAB" in the last 72 hours. Anemia Panel: No results for input(s): "VITAMINB12", "FOLATE", "FERRITIN", "TIBC", "IRON", "RETICCTPCT" in the last 72 hours. Urine analysis:    Component Value Date/Time   COLORURINE YELLOW 06/03/2023 1102   APPEARANCEUR CLEAR 06/03/2023 1102   LABSPEC >=1.030 (A) 06/03/2023 1102   PHURINE 5.5 06/03/2023 1102   GLUCOSEU NEGATIVE 06/03/2023 1102   HGBUR NEGATIVE 06/03/2023 1102   BILIRUBINUR NEGATIVE 06/03/2023 1102   KETONESUR NEGATIVE 06/03/2023 1102   UROBILINOGEN 0.2 06/03/2023 1102   NITRITE NEGATIVE 06/03/2023 1102   LEUKOCYTESUR SMALL (A) 06/03/2023 1102    Radiological Exams on Admission: DG Chest 2 View Result Date: 06/03/2023 CLINICAL DATA:  Chest pain. EXAM: CHEST - 2 VIEW COMPARISON:  08/18/2020. FINDINGS: There are mildly increased interstitial markings, which are nonspecific. No overt pulmonary edema. Bilateral lung fields are clear. No acute consolidation or lung collapse. Bilateral costophrenic angles are clear. Normal cardio-mediastinal silhouette. No acute osseous abnormalities. The soft tissues are within normal limits. IMPRESSION: No active cardiopulmonary disease. Electronically Signed   By: Jules Schick M.D.   On: 06/03/2023 17:06      Assessment/Plan   Principal Problem:   Elevated troponin Active Problems:   Essential  hypertension   Mild intermittent asthma   Generalized weakness   Hypokalemia   Chronic diastolic CHF (congestive heart failure) (HCC)     #) Elevated troponin: mildly elevated initial troponin as outpatient today noted to be 40, with ensuing values of 39 followed by 41 per labs performed in the ED this evening.  Otherwise, no prior high sensitivity troponin I value available for point of comparison.  Aside from her 1 week of generalized weakness in the absence of any acute focal neurologic deficits, she is otherwise asymptomatic, including no chest pain, shortness of breath, nausea, vomiting, and no additional symptoms that would be suggestive of a recent anginal equivalent.  Etiology of mildly elevated high sensitive troponin I is not entirely clear at this time, and baseline degree of elevation of her troponin level is unclear, in the absence of any prior high sensitive troponin I data points.  EKG shows no overt evidence of acute ischemic changes, including no evidence of STEMI, while showing some nonspecific less than 1 mm ST depression in V4 and V5. CXR showed no acute CP process, including no evidence of pneumothorax.  Overall, ACS is felt to be less likely at this time, given that this is a new finding for her, in the setting of multiple CAD risk factors, including her age, history of hypertension, history of hyperlipidemia, and no recent coronary ischemic evaluation since her coronary angiography in July 2017, we will admit this patient for overnight observation for further evaluation management of her mildly elevated troponin, including no known history of underlying CAD.  A recent prior MI is possible, and could potentially be contributory to her 1 week of generalized weakness.  Of note, EDP discussed patient's case with on-call cardiology, Dr. Antoine Poche, who will formally consult and see the patient this evening, with additional recommendations pending at this time.  Clinically,  presentation appears less suggestive of acute pulmonary embolism.  She has received a full dose aspirin x 1 in the ED this evening.  Plan: repeat troponin in the AM.  Monitor on telemetry. PRN EKG for development of chest pain. Check serum Mg level and repeat CMP in the morning.  Repeat CBC in the AM.  Echocardiogram in the morning.  Cardiology to formally consult, as above.  Further evaluation management of presenting hypokalemia, as below.  Refraining from beta-blocker in the setting of borderline bradycardia.  Check urinary drug screen.                   #) Generalized weakness: 1 week of generalized weakness, in the absence of any evidence of acute focal neurologic deficits, including no evidence of acute focal weakness to suggest acute CVA.  Unclear etiology at this time, while noting patient is being hospitalized overnight for further evaluation and management of mildly elevated troponin, felt to be less consistent with ACS, as above.    No overt evidence of underlying infectious process at this time, including outpatient urinalysis performed earlier today that is inconsistent with UTI, chest x-ray shows no evidence of acute cardiopulmonary process, including no evidence of infiltrate.  Will further eval for any additional contributions from endocrine/metabolic sources, as detailed below.   Plan: work-up and management of presenting mild elevated troponin, as described above. PT/OT consults ordered for the AM. Fall precautions. CMP/CBC in the AM. Check TSH, serum Mg level. Check B12 level.                     #) Hypokalemia: presenting potassium level noted to be 3.2, which is notable in the context of her presenting mildly elevated troponin.    Plan: monitor on tele. KCl  40 meq  po every 4 hours x 2 doses. Add-on serum mag level. CMP, mag level in the AM.                   #) Essential Hypertension: documented h/o such, which appears to be  managed via lifestyle modifications, in the absence of any use of outpatient antihypertensive medications.  SBP's in the ED today: In the 160s to 190s mmHg. she has received a single dose of hydralazine 25 mg p.o. in the ED this evening.  Of note, in the context of her borderline bradycardia, will pursue prn IV hydralazine, as opposed to prn IV labetalol, with parameters quantified below.  Plan: Close monitoring of subsequent BP via routine VS. as needed IV hydralazine for systolic blood pressure greater than 180 or diastolic blood pressure greater than 110 mmHg. monitor on symmetry.  Monitor strict I's and O's and daily weights.  Follow-up for results of echocardiogram ordered for the morning.                     #) Chronic diastolic heart failure: documented history of such, with most recent echocardiogram performed in July 2017, which is notable for LVEF 60 to 65%, no evidence of focal Measurin values, while showing evidence of grade 1 diastolic dysfunction. No clinical or radiographic evidence to suggest acutely decompensated heart failure at this time. home diuretic regimen reportedly consists of the following: None.   Plan: monitor strict I's & O's and daily weights. Repeat CMP in AM. Check serum mag level.  Follow-up result of updated echocardiogram ordered for the morning, as above.                  #) Mild intermittent asthma: documented history thereof, without clinical e/o to suggest current exacerbation. Outpatient respiratory regimen includes prn albuterol inhaler.   Plan: Check serum  magnesium level.  Prn albuterol nebulizer.     DVT prophylaxis: SCD's   Code Status: Full code Family Communication: none Disposition Plan: Per Rounding Team Consults called: EDP d/w on-call cardiology, Dr. Antoine Poche, who will formally consult, as further detailed above;  Admission status: Observation     I SPENT GREATER THAN 75  MINUTES IN CLINICAL CARE  TIME/MEDICAL DECISION-MAKING IN COMPLETING THIS ADMISSION.      Chaney Born Reakwon Barren DO [facility]  From 7PM - 7AM   06/03/2023, 7:46 PM

## 2023-06-03 NOTE — Progress Notes (Signed)
Subjective:  Patient ID: Brenda Bautista, female    DOB: 1945/07/15  Age: 77 y.o. MRN: [id number]  CC: Hypertension and Hyperlipidemia   HPI Brenda Bautista presents for f/up ----  Discussed the use of AI scribe software for clinical note transcription with the patient, who gave verbal consent to proceed.  History of Present Illness   The patient, with a history of hyperlipidemia, asthma, depression, osteoporosis, and anxiety, presents with concerns of declining memory. She has been out of her medications, including atorvastatin, Symbicort, Rexulti, Actonel, Prozac, and Xanax, due to repeated cancellations of appointments. The patient denies any feelings of weakness, dizziness, or lightheadedness despite a noted low heart rate of 50.  The patient's family has observed increasing forgetfulness, fatigue, irritability, and frustration. An example of the memory issue was provided where the patient forgot the purpose of a shopping trip within a short span of time. The patient, however, denies any memory issues and fatigue, and is able to correctly identify the day of the week, month, and date, but incorrectly identifies the year.  The patient denies any recent falls or near falls. She also denies any trouble breathing and reports regular bowel movements. The patient's blood pressure was noted to be within normal limits. The patient has not received a flu shot yet and expresses a desire to get one.       Outpatient Medications Prior to Visit  Medication Sig Dispense Refill   albuterol (PROVENTIL HFA;VENTOLIN HFA) 108 (90 Base) MCG/ACT inhaler Inhale 2 puffs into the lungs every 6 (six) hours as needed for wheezing or shortness of breath.     aspirin 81 MG tablet Take 81 mg by mouth daily.     budesonide-formoterol (SYMBICORT) 80-4.5 MCG/ACT inhaler INHALE 2 PUFFS INTO THE LUNGS IN THE MORNING AND AT BEDTIME. 30.6 g 3   calcium carbonate 1250 MG capsule Take 1,250 mg by mouth daily.      carboxymethylcellulose (REFRESH PLUS) 0.5 % SOLN Place 1 drop into both eyes at bedtime.     ALPRAZolam (XANAX) 0.25 MG tablet Take 1 tablet (0.25 mg total) by mouth 3 (three) times daily as needed for anxiety. (Patient not taking: Reported on 06/03/2023) 90 tablet 3   atorvastatin (LIPITOR) 40 MG tablet Take 1 tablet (40 mg total) by mouth daily. (Patient not taking: Reported on 06/03/2023) 90 tablet 1   azelastine (ASTELIN) 0.1 % nasal spray Place 2 sprays into both nostrils 2 (two) times daily. Use in each nostril as directed (Patient not taking: Reported on 06/03/2023) 30 mL 12   cholecalciferol (VITAMIN D) 1000 units tablet Take 1,000 Units by mouth daily. (Patient not taking: Reported on 06/03/2023)     FLUoxetine (PROZAC) 40 MG capsule TAKE 1 CAPSULE (40 MG TOTAL) BY MOUTH DAILY. (Patient not taking: Reported on 06/03/2023) 90 capsule 1   fluticasone (FLONASE) 50 MCG/ACT nasal spray PLACE 2 SPRAYS INTO BOTH NOSTRILS DAILY. (Patient not taking: Reported on 06/03/2023) 2 g 1   montelukast (SINGULAIR) 10 MG tablet TAKE 1 TABLET BY MOUTH EVERY DAY (Patient not taking: Reported on 06/03/2023) 30 tablet 0   Multiple Vitamins-Minerals (MULTIVITAMIN WITH MINERALS) tablet Take 1 tablet by mouth daily. (Patient not taking: Reported on 06/03/2023)     Probiotic Product (PROBIOTIC ADVANCED PO) Take 1 capsule by mouth daily. (Patient not taking: Reported on 06/03/2023)     REXULTI 0.25 MG TABS tablet TAKE 1 TABLET BY MOUTH EVERY DAY (Patient not taking: Reported on 06/03/2023) 90 tablet 1  risedronate (ACTONEL) 35 MG tablet TAKE 1 TABLET EVERY 7 DAYS W/WATER ON EMPTY STOMACH, NOTHING BY MOUTH OR LIE DOWN FOR NEXT (Patient not taking: Reported on 06/03/2023) 12 tablet 0   No facility-administered medications prior to visit.    ROS Review of Systems  Constitutional:  Positive for fatigue and unexpected weight change (wt loss). Negative for appetite change, chills, diaphoresis and fever.   HENT: Negative.    Eyes: Negative.   Respiratory: Negative.  Negative for cough, chest tightness, shortness of breath and wheezing.   Cardiovascular:  Negative for chest pain, palpitations and leg swelling.  Gastrointestinal: Negative.  Negative for abdominal pain, constipation, diarrhea, nausea and vomiting.  Genitourinary:  Negative for difficulty urinating.  Musculoskeletal: Negative.  Negative for arthralgias and myalgias.  Skin: Negative.   Neurological:  Negative for dizziness and weakness.  Hematological:  Negative for adenopathy. Does not bruise/bleed easily.  Psychiatric/Behavioral:  Positive for confusion and decreased concentration.     Objective:  BP (!) 152/74 (BP Location: Left Arm, Patient Position: Sitting)   Pulse (!) 50   Temp 97.6 F (36.4 C) (Temporal)   Resp 16   Ht 4\' 11"  (1.499 m)   Wt 116 lb (52.6 kg)   SpO2 98%   BMI 23.43 kg/m   BP Readings from Last 3 Encounters:  06/03/23 (!) 152/74  11/22/22 136/76  07/26/22 (!) 142/72    Wt Readings from Last 3 Encounters:  06/03/23 116 lb (52.6 kg)  11/22/22 125 lb (56.7 kg)  07/26/22 129 lb (58.5 kg)    Physical Exam Vitals reviewed.  Constitutional:      General: She is not in acute distress.    Appearance: She is not ill-appearing, toxic-appearing or diaphoretic.  HENT:     Nose: Nose normal.     Mouth/Throat:     Mouth: Mucous membranes are moist.  Eyes:     General: No scleral icterus.    Conjunctiva/sclera: Conjunctivae normal.  Cardiovascular:     Rate and Rhythm: Bradycardia present. Occasional Extrasystoles are present.    Heart sounds: No murmur heard.    No gallop.     Comments: EKG- SB with PAC's, 53 bpm TWI over the lateral leads is new No LVH or Q waves Pulmonary:     Effort: Pulmonary effort is normal.     Breath sounds: No stridor. No wheezing, rhonchi or rales.  Abdominal:     General: Abdomen is flat.     Palpations: There is no mass.     Tenderness: There is no  abdominal tenderness. There is no guarding.     Hernia: No hernia is present.  Musculoskeletal:        General: Normal range of motion.     Cervical back: Neck supple.     Right lower leg: No edema.     Left lower leg: No edema.  Lymphadenopathy:     Cervical: No cervical adenopathy.  Skin:    General: Skin is warm and dry.  Neurological:     General: No focal deficit present.     Mental Status: She is alert. Mental status is at baseline.  Psychiatric:        Mood and Affect: Mood normal.        Behavior: Behavior normal.     Lab Results  Component Value Date   WBC 7.1 11/22/2022   HGB 13.5 11/22/2022   HCT 40.5 11/22/2022   PLT 363.0 11/22/2022   GLUCOSE 88 11/22/2022  CHOL 194 11/22/2022   TRIG 175.0 (H) 11/22/2022   HDL 55.80 11/22/2022   LDLCALC 103 (H) 11/22/2022   ALT 19 11/22/2022   AST 31 11/22/2022   NA 139 11/22/2022   K 4.1 11/22/2022   CL 102 11/22/2022   CREATININE 1.02 11/22/2022   BUN 15 11/22/2022   CO2 29 11/22/2022   TSH 3.54 11/22/2022   INR 1.15 12/18/2015   HGBA1C 5.8 11/22/2022    MM 3D SCREEN BREAST BILATERAL Result Date: 03/05/2021 CLINICAL DATA:  Screening. EXAM: DIGITAL SCREENING BILATERAL MAMMOGRAM WITH TOMOSYNTHESIS AND CAD TECHNIQUE: Bilateral screening digital craniocaudal and mediolateral oblique mammograms were obtained. Bilateral screening digital breast tomosynthesis was performed. The images were evaluated with computer-aided detection. COMPARISON:  Previous exam(s). ACR Breast Density Category b: There are scattered areas of fibroglandular density. FINDINGS: There are no findings suspicious for malignancy. IMPRESSION: No mammographic evidence of malignancy. A result letter of this screening mammogram will be mailed directly to the patient. RECOMMENDATION: Screening mammogram in one year. (Code:SM-B-01Y) BI-RADS CATEGORY  1: Negative. Electronically Signed   By: Harmon Pier M.D.   On: 03/05/2021 08:57    Assessment & Plan:    Bradycardia- She is asymptomatic with this.  Will evaluate for hypothyroidism. -     Thyroid Panel With TSH; Future -     Hepatic function panel; Future -     EKG 12-Lead  Essential hypertension- Her blood pressure is adequately well-controlled. -     Urinalysis, Routine w reflex microscopic; Future -     Hepatic function panel; Future -     CBC with Differential/Platelet; Future -     Basic metabolic panel; Future  Stage 3b chronic kidney disease (HCC) - Will avoid nephrotoxic agents  -     Urinalysis, Routine w reflex microscopic; Future -     Hepatic function panel; Future -     Basic metabolic panel; Future  Need for influenza vaccination -     Flu Vaccine Trivalent High Dose (Fluad)  Abnormal electrocardiogram (ECG) (EKG)- She has changes on her EKG and her BNP and troponin are mildly elevated.  She was referred to the ED. -     Troponin I (High Sensitivity); Future -     Brain natriuretic peptide; Future     Follow-up: Return in about 3 months (around 09/01/2023).  Sanda Linger, MD

## 2023-06-03 NOTE — ED Provider Notes (Signed)
Brenda Bautista EMERGENCY DEPARTMENT AT [facility] Provider Note   CSN: [id number] Arrival date & time: 06/03/23  1412     History {Add pertinent medical, surgical, social history, OB history to HPI:1} Chief Complaint  Patient presents with   Abnormal ECG    Brenda Bautista is a 77 y.o. female.  77 year old female with a history of hypertension and hyperlipidemia who presents to the emergency department due to fatigue.  Patient was brought in by her son to her primary care appointment today for generalized fatigue has been worsening over weeks.  They had an EKG performed that showed concerning changes and referred her into the emergency department.  Also drew a high-sensitivity troponin that was 40.  Son says that she has not been going to her appointments recently and is concerned about her following up as an outpatient.  Mother had an MI at 73.  She had a heart cath in 2017 that showed minimal CAD.  Denies any chest pain, shortness of breath, dizziness, diaphoresis, or vomiting.       Home Medications Prior to Admission medications   Medication Sig Start Date End Date Taking? Authorizing Provider  albuterol (PROVENTIL HFA;VENTOLIN HFA) 108 (90 Base) MCG/ACT inhaler Inhale 2 puffs into the lungs every 6 (six) hours as needed for wheezing or shortness of breath.    [provider]  ALPRAZolam Prudy Feeler) 0.25 MG tablet Take 1 tablet (0.25 mg total) by mouth 3 (three) times daily as needed for anxiety. Patient not taking: Reported on 06/03/2023 04/16/21   Etta Grandchild, MD  aspirin 81 MG tablet Take 81 mg by mouth daily.    [provider]  atorvastatin (LIPITOR) 40 MG tablet Take 1 tablet (40 mg total) by mouth daily. Patient not taking: Reported on 06/03/2023 01/07/23   Etta Grandchild, MD  azelastine (ASTELIN) 0.1 % nasal spray Place 2 sprays into both nostrils 2 (two) times daily. Use in each nostril as directed Patient not taking: Reported on 06/03/2023  05/20/17   Chilton Greathouse, MD  budesonide-formoterol (SYMBICORT) 80-4.5 MCG/ACT inhaler INHALE 2 PUFFS INTO THE LUNGS IN THE MORNING AND AT BEDTIME. 11/29/22   Mannam, Praveen, MD  calcium carbonate 1250 MG capsule Take 1,250 mg by mouth daily.    [provider]  carboxymethylcellulose (REFRESH PLUS) 0.5 % SOLN Place 1 drop into both eyes at bedtime.    [provider]  cholecalciferol (VITAMIN D) 1000 units tablet Take 1,000 Units by mouth daily. Patient not taking: Reported on 06/03/2023    [provider]  FLUoxetine (PROZAC) 40 MG capsule TAKE 1 CAPSULE (40 MG TOTAL) BY MOUTH DAILY. Patient not taking: Reported on 06/03/2023 10/12/21   Etta Grandchild, MD  fluticasone (FLONASE) 50 MCG/ACT nasal spray PLACE 2 SPRAYS INTO BOTH NOSTRILS DAILY. Patient not taking: Reported on 06/03/2023 02/17/17   Chilton Greathouse, MD  montelukast (SINGULAIR) 10 MG tablet TAKE 1 TABLET BY MOUTH EVERY DAY Patient not taking: Reported on 06/03/2023 09/27/18   Chilton Greathouse, MD  Multiple Vitamins-Minerals (MULTIVITAMIN WITH MINERALS) tablet Take 1 tablet by mouth daily. Patient not taking: Reported on 06/03/2023    [provider]  Probiotic Product (PROBIOTIC ADVANCED PO) Take 1 capsule by mouth daily. Patient not taking: Reported on 06/03/2023    [provider]  REXULTI 0.25 MG TABS tablet TAKE 1 TABLET BY MOUTH EVERY DAY Patient not taking: Reported on 06/03/2023 01/27/22   Etta Grandchild, MD  risedronate (ACTONEL) 35 MG tablet TAKE  1 TABLET EVERY 7 DAYS W/WATER ON EMPTY STOMACH, NOTHING BY MOUTH OR LIE DOWN FOR NEXT Patient not taking: Reported on 06/03/2023 10/20/21   Etta Grandchild, MD      Allergies    Codeine, Bactrim [sulfamethoxazole-trimethoprim], Flagyl [metronidazole], and Meloxicam    Review of Systems   Review of Systems  Physical Exam Updated Vital Signs BP (!) 165/64   Pulse 63   Temp 98 F (36.7 C) (Oral)   Resp 18   Ht 4\' 11"   (1.499 m)   Wt 52.6 kg   SpO2 98%   BMI 23.43 kg/m  Physical Exam Vitals and nursing note reviewed.  Constitutional:      General: She is not in acute distress.    Appearance: She is well-developed.  HENT:     Head: Normocephalic and atraumatic.     Right Ear: External ear normal.     Left Ear: External ear normal.     Nose: Nose normal.  Eyes:     Extraocular Movements: Extraocular movements intact.     Conjunctiva/sclera: Conjunctivae normal.     Pupils: Pupils are equal, round, and reactive to light.  Cardiovascular:     Rate and Rhythm: Normal rate and regular rhythm.     Heart sounds: No murmur heard. Pulmonary:     Effort: Pulmonary effort is normal. No respiratory distress.     Breath sounds: Normal breath sounds.  Musculoskeletal:     Cervical back: Normal range of motion and neck supple.     Right lower leg: No edema.     Left lower leg: No edema.  Skin:    General: Skin is warm and dry.  Neurological:     Mental Status: She is alert and oriented to person, place, and time. Mental status is at baseline.  Psychiatric:        Mood and Affect: Mood normal.     ED Results / Procedures / Treatments   Labs (all labs ordered are listed, but only abnormal results are displayed) Labs Reviewed  BASIC METABOLIC PANEL - Abnormal; Notable for the following components:      Result Value   Potassium 3.2 (*)    Glucose, Bld 138 (*)    Creatinine, Ser 1.04 (*)    GFR, Estimated 55 (*)    All other components within normal limits  TROPONIN I (HIGH SENSITIVITY) - Abnormal; Notable for the following components:   Troponin I (High Sensitivity) 39 (*)    All other components within normal limits  CBC  TROPONIN I (HIGH SENSITIVITY)    EKG EKG Interpretation Date/Time:  Friday June 03 2023 14:08:50 EST Ventricular Rate:  59 PR Interval:  174 QRS Duration:  90 QT Interval:  408 QTC Calculation: 403 R Axis:   46  Text Interpretation: Sinus bradycardia ST & T wave  abnormality, consider anterolateral ischemia Abnormal ECG When compared with ECG of 18-Dec-2015 10:10, ST depressions in anterolateral leads more prominent Confirmed by Vonita Moss [phone]) on 06/03/2023 6:11:01 PM  Radiology DG Chest 2 View Result Date: 06/03/2023 CLINICAL DATA:  Chest pain. EXAM: CHEST - 2 VIEW COMPARISON:  08/18/2020. FINDINGS: There are mildly increased interstitial markings, which are nonspecific. No overt pulmonary edema. Bilateral lung fields are clear. No acute consolidation or lung collapse. Bilateral costophrenic angles are clear. Normal cardio-mediastinal silhouette. No acute osseous abnormalities. The soft tissues are within normal limits. IMPRESSION: No active cardiopulmonary disease. Electronically Signed   By: Timoteo Expose.D.  On: 06/03/2023 17:06    Procedures Procedures  {Document cardiac monitor, telemetry assessment procedure when appropriate:1}  Medications Ordered in ED Medications - No data to display  ED Course/ Medical Decision Making/ A&P Clinical Course as of 06/03/23 1841  Fri Jun 03, 2023  1837 Dr Antoine Poche [RP]    Clinical Course User Index [RP] Rondel Baton, MD   {   Click here for ABCD2, HEART and other calculatorsREFRESH Note before signing :1}                              Medical Decision Making  ***  {Document critical care time when appropriate:1} {Document review of labs and clinical decision tools ie heart score, Chads2Vasc2 etc:1}  {Document your independent review of radiology images, and any outside records:1} {Document your discussion with family members, caretakers, and with consultants:1} {Document social determinants of health affecting pt's care:1} {Document your decision making why or why not admission, treatments were needed:1} Final Clinical Impression(s) / ED Diagnoses Final diagnoses:  None    Rx / DC Orders ED Discharge Orders     None

## 2023-06-03 NOTE — ED Provider Triage Note (Signed)
Emergency Medicine Provider Triage Evaluation Note  Brenda Bautista , a 77 y.o. female  was evaluated in triage.  Pt complains of elevated troponin.  Review of Systems  Positive:  Negative:   Physical Exam  BP (!) 165/64   Pulse 63   Temp 98 F (36.7 C) (Oral)   Resp 18   SpO2 98%  Gen:   Awake, no distress   Resp:  Normal effort  MSK:   Moves extremities without difficulty  Other:    Medical Decision Making  Medically screening exam initiated at 2:23 PM.  Appropriate orders placed.  Brenda Bautista was informed that the remainder of the evaluation will be completed by another provider, this initial triage assessment does not replace that evaluation, and the importance of remaining in the ED until their evaluation is complete.  Patient went to PCP today d/t recent fatigue. PCP took troponin which was elevated at 40. Family at bedside stating that patient has also been having memory and gait problem steadily worsening over a long period of time.  Denies fever, chest pain, dyspnea, cough, nausea, vomiting, diarrhea.   Dorthy Cooler, [place] 06/03/23 1424

## 2023-06-03 NOTE — Patient Instructions (Signed)
Bradycardia, Adult Bradycardia is a slower-than-normal heartbeat. A normal resting heart rate for an adult ranges from 60 to 100 beats per minute. With bradycardia, the resting heart rate is less than 60 beats per minute. Bradycardia can prevent enough oxygen from reaching certain areas of your body when you are active. It can be serious if it keeps enough oxygen from reaching your brain and other parts of your body. Bradycardia is not a problem for everyone. For some healthy adults, a slow resting heart rate is normal. What are the causes? This condition may be caused by: A problem with the heart, including: A problem with the heart's electrical system, such as a heart block. With a heart block, electrical signals between the chambers of the heart are partially or completely blocked, so they are not able to work as they should. A problem with the heart's natural pacemaker (sinus node). Heart disease. A heart attack. Heart damage. Lyme disease. A heart infection. A heart condition that is present at birth (congenital heart defect). Certain medicines that treat heart conditions. Certain conditions, such as hypothyroidism and obstructive sleep apnea. Problems with the balance of chemicals and other substances, like potassium, in the blood. Trauma. Radiation therapy. What increases the risk? You are more likely to develop this condition if you: Are age 65 or older. Have high blood pressure (hypertension), high cholesterol (hyperlipidemia), or diabetes. Drink heavily, use tobacco or nicotine products, or use drugs. What are the signs or symptoms? Symptoms of this condition include: Light-headedness. Feeling faint or fainting. Fatigue and weakness. Trouble with activity or exercise. Shortness of breath. Chest pain (angina). Drowsiness. Confusion. Dizziness. How is this diagnosed? This condition may be diagnosed based on: Your symptoms. Your medical history. A physical exam. During  the exam, your health care provider will listen to your heartbeat and check your pulse. To confirm the diagnosis, your health care provider may order tests, such as: Blood tests. An electrocardiogram (ECG). This test records the heart's electrical activity. The test can show how fast your heart is beating and whether the heartbeat is steady. A test in which you wear a portable device (event recorder or Holter monitor) to record your heart's electrical activity while you go about your day. An exercise test. How is this treated? Treatment for this condition depends on the cause of the condition and how severe your symptoms are. Treatment may involve: Treatment of the underlying condition. Changing your medicines or how much medicine you take. Having a small, battery-operated device called a pacemaker implanted under the skin. When bradycardia occurs, this device can be used to increase your heart rate and help your heart beat in a regular rhythm. Follow these instructions at home: Lifestyle Manage any health conditions that contribute to bradycardia as told by your health care provider. Follow a heart-healthy diet. A nutrition specialist (dietitian) can help educate you about healthy food options and changes. Follow an exercise program that is approved by your health care provider. Maintain a healthy weight. Try to reduce or manage your stress, such as with yoga or meditation. If you need help reducing stress, ask your health care provider. Do not use any products that contain nicotine or tobacco. These products include cigarettes, chewing tobacco, and vaping devices, such as e-cigarettes. If you need help quitting, ask your health care provider. Do not use illegal drugs. Alcohol use If you drink alcohol: Limit how much you have to: 0-1 drink a day for women who are not pregnant. 0-2 drinks a day   for men. Know how much alcohol is in a drink. In the [place] one drink equals one 12 oz bottle of  beer (355 mL), one 5 oz glass of wine (148 mL), or one 1 oz glass of hard liquor (44 mL). General instructions Take over-the-counter and prescription medicines only as told by your health care provider. Keep all follow-up visits. This is important. How is this prevented? In some cases, bradycardia may be prevented by: Treating underlying medical problems. Stopping behaviors or medicines that can trigger the condition. Contact a health care provider if: You feel light-headed or dizzy. You almost faint. You feel weak or are easily fatigued during physical activity. You experience confusion or have memory problems. Get help right away if: You faint. You have chest pains or an irregular heartbeat (palpitations). You have trouble breathing. These symptoms may represent a serious problem that is an emergency. Do not wait to see if the symptoms will go away. Get medical help right away. Call your local emergency services (911 in the [place] Do not drive yourself to the hospital. Summary Bradycardia is a slower-than-normal heartbeat. With bradycardia, the resting heart rate is less than 60 beats per minute. Treatment for this condition depends on the cause. Manage any health conditions that contribute to bradycardia as told by your health care provider. Do not use any products that contain nicotine or tobacco. These products include cigarettes, chewing tobacco, and vaping devices, such as e-cigarettes. Keep all follow-up visits. This is important. This information is not intended to replace advice given to you by your health care provider. Make sure you discuss any questions you have with your health care provider. Document Revised: 09/14/2020 Document Reviewed: 09/14/2020 Elsevier Patient Education  2024 [employer]  

## 2023-06-04 ENCOUNTER — Observation Stay (HOSPITAL_COMMUNITY): Payer: 59

## 2023-06-04 DIAGNOSIS — R7989 Other specified abnormal findings of blood chemistry: Secondary | ICD-10-CM | POA: Diagnosis not present

## 2023-06-04 DIAGNOSIS — R011 Cardiac murmur, unspecified: Secondary | ICD-10-CM | POA: Diagnosis not present

## 2023-06-04 DIAGNOSIS — I10 Essential (primary) hypertension: Secondary | ICD-10-CM | POA: Diagnosis not present

## 2023-06-04 LAB — COMPREHENSIVE METABOLIC PANEL
ALT: 16 U/L (ref 0–44)
AST: 25 U/L (ref 15–41)
Albumin: 3.4 g/dL — ABNORMAL LOW (ref 3.5–5.0)
Alkaline Phosphatase: 78 U/L (ref 38–126)
Anion gap: 9 (ref 5–15)
BUN: 16 mg/dL (ref 8–23)
CO2: 22 mmol/L (ref 22–32)
Calcium: 9 mg/dL (ref 8.9–10.3)
Chloride: 108 mmol/L (ref 98–111)
Creatinine, Ser: 0.96 mg/dL (ref 0.44–1.00)
GFR, Estimated: >60 mL/min (ref >60)
Glucose, Bld: 94 mg/dL (ref 70–99)
Potassium: 3.9 mmol/L (ref 3.5–5.1)
Sodium: 139 mmol/L (ref 135–145)
Total Bilirubin: 1.4 mg/dL — ABNORMAL HIGH (ref <1.2)
Total Protein: 6.1 g/dL — ABNORMAL LOW (ref 6.5–8.1)

## 2023-06-04 LAB — THYROID PANEL WITH TSH
Free Thyroxine Index: 2.1 (ref 1.4–3.8)
T3 Uptake: 27 % (ref 22–35)
T4, Total: 7.9 ug/dL (ref 5.1–11.9)
TSH: 2.69 m[IU]/L (ref 0.40–4.50)

## 2023-06-04 LAB — CBC WITH DIFFERENTIAL/PLATELET
Abs Immature Granulocytes: 0.01 10*3/uL (ref 0.00–0.07)
Basophils Absolute: 0.1 10*3/uL (ref 0.0–0.1)
Basophils Relative: 1 %
Eosinophils Absolute: 0.1 10*3/uL (ref 0.0–0.5)
Eosinophils Relative: 1 %
HCT: 36.5 % (ref 36.0–46.0)
Hemoglobin: 12.3 g/dL (ref 12.0–15.0)
Immature Granulocytes: 0 %
Lymphocytes Relative: 26 %
Lymphs Abs: 1.8 10*3/uL (ref 0.7–4.0)
MCH: 30.8 pg (ref 26.0–34.0)
MCHC: 33.7 g/dL (ref 30.0–36.0)
MCV: 91.5 fL (ref 80.0–100.0)
Monocytes Absolute: 0.8 10*3/uL (ref 0.1–1.0)
Monocytes Relative: 12 %
Neutro Abs: 4 10*3/uL (ref 1.7–7.7)
Neutrophils Relative %: 60 %
Platelets: 248 10*3/uL (ref 150–400)
RBC: 3.99 MIL/uL (ref 3.87–5.11)
RDW: 13.2 % (ref 11.5–15.5)
WBC: 6.7 10*3/uL (ref 4.0–10.5)
nRBC: 0 % (ref 0.0–0.2)

## 2023-06-04 LAB — MAGNESIUM
Magnesium: 1.9 mg/dL (ref 1.7–2.4)
Magnesium: 1.8 mg/dL (ref 1.7–2.4)

## 2023-06-04 LAB — VITAMIN B12: Vitamin B-12: 524 pg/mL (ref 180–914)

## 2023-06-04 LAB — TROPONIN I (HIGH SENSITIVITY): Troponin I (High Sensitivity): 38 ng/L — ABNORMAL HIGH (ref <18)

## 2023-06-04 MED ORDER — AMLODIPINE BESYLATE 5 MG PO TABS
5.0000 mg | ORAL_TABLET | Freq: Every day | ORAL | Status: DC
  Administered 2023-06-04 – 2023-06-05 (×2): 5 mg via ORAL
  Filled 2023-06-04 (×2): qty 1

## 2023-06-04 NOTE — Evaluation (Signed)
Physical Therapy Evaluation and Discharge Patient Details Name: Brenda Bautista MRN: [id number] DOB: [birth year] Today's Date: 06/04/2023  History of Present Illness  77 y.o. female who is admitted to [facility] on 06/03/2023 with elevated troponin after presenting from home to [facility] ED complaining of generalized weakness. Medical history significant for essential hypertension, hyperlipidemia, chronic diastolic heart failure, mild intermittent asthma,.  Clinical Impression  Patient evaluated by Physical Therapy with no further acute PT needs identified. All education has been completed and the patient has no further questions. Pt lives alone and cares for herself independently. Family assists with grocery shopping. She has some memory deficits confirmed by family and appears to be at baseline cognitive level.  She is at a mod I to independent level with all mobility assessed today. No evidence of imbalance, ambulating 150 feet without an assistive device. VSS. Adequate for d/c from a mobility standpoint. No PT follow-up indicated. PT is signing off. Thank you for this referral.         If plan is discharge home, recommend the following: Assist for transportation   Can travel by private vehicle        Equipment Recommendations None recommended by PT  Recommendations for Other Services       Functional Status Assessment Patient has not had a recent decline in their functional status     Precautions / Restrictions Precautions Precautions: Fall Restrictions Weight Bearing Restrictions Per Provider Order: No      Mobility  Bed Mobility Overal bed mobility: Independent             General bed mobility comments: no assist    Transfers Overall transfer level: Modified independent Equipment used: None               General transfer comment: Stable upon rising, assisted with leads only    Ambulation/Gait Ambulation/Gait assistance: Modified independent  (Device/Increase time) Gait Distance (Feet): 150 Feet Assistive device: None Gait Pattern/deviations: WFL(Within Functional Limits) Gait velocity: WFL Gait velocity interpretation: >2.62 ft/sec, indicative of community ambulatory   General Gait Details: Grossly within functional limits, only required a few cues for direction recall. No evidence of LOB or buckling. Stable without assistive device. VSS.  Stairs            Wheelchair Mobility     Tilt Bed    Modified Rankin (Stroke Patients Only)       Balance Overall balance assessment: Modified Independent                                           Pertinent Vitals/Pain Pain Assessment Pain Assessment: No/denies pain    Home Living Family/patient expects to be discharged to:: Private residence Living Arrangements: Alone Available Help at Discharge: Family;Available PRN/intermittently (Intermittent assist from family) Type of Home: House Home Access: Level entry       Home Layout: One level Home Equipment: Agricultural consultant (2 wheels);Shower seat;Grab bars - tub/shower      Prior Function Prior Level of Function : Needs assist             Mobility Comments: Pt reports ind at home, has family do the shopping ADLs Comments: Reports ind, but son takes her shopping for groceries     Extremity/Trunk Assessment   Upper Extremity Assessment Upper Extremity Assessment: Defer to OT evaluation;Right hand dominant    Lower Extremity  Assessment Lower Extremity Assessment: Generalized weakness       Communication   Communication Communication: No apparent difficulties Cueing Techniques: Verbal cues  Cognition Arousal: Alert Behavior During Therapy: WFL for tasks assessed/performed Overall Cognitive Status: History of cognitive impairments - at baseline                                 General Comments: Per family, pt has memory issues at baseline. Today she is oriented to  self, location, and month, but gives incorrect year "2065"        General Comments General comments (skin integrity, edema, etc.): BP 144/87, HR 50s-60s, SpO 97%+ on RA.    Exercises     Assessment/Plan    PT Assessment Patient does not need any further PT services  PT Problem List         PT Treatment Interventions      PT Goals (Current goals can be found in the Care Plan section)  Acute Rehab PT Goals Patient Stated Goal: Go home today PT Goal Formulation: All assessment and education complete, DC therapy    Frequency       Co-evaluation               AM-PAC PT "6 Clicks" Mobility  Outcome Measure Help needed turning from your back to your side while in a flat bed without using bedrails?: None Help needed moving from lying on your back to sitting on the side of a flat bed without using bedrails?: None Help needed moving to and from a bed to a chair (including a wheelchair)?: None Help needed standing up from a chair using your arms (e.g., wheelchair or bedside chair)?: None Help needed to walk in hospital room?: None Help needed climbing 3-5 steps with a railing? : None 6 Click Score: 24    End of Session   Activity Tolerance: Patient tolerated treatment well Patient left: in bed;with call bell/phone within reach;with family/visitor present   PT Visit Diagnosis: Muscle weakness (generalized) (M62.81)    Time: 4098-1191 PT Time Calculation (min) (ACUTE ONLY): 14 min   Charges:   PT Evaluation $PT Eval Low Complexity: 1 Low   PT General Charges $$ ACUTE PT VISIT: 1 Visit         Kathlyn Sacramento, PT, DPT [facility]  Rehabilitation Services Physical Therapist Office: [phone] Website: [email or web address]   Berton Mount 06/04/2023, 9:58 AM

## 2023-06-04 NOTE — Plan of Care (Signed)
  Problem: Clinical Measurements: Goal: Will remain free from infection Outcome: Progressing   Problem: Safety: Goal: Ability to remain free from injury will improve Outcome: Progressing   

## 2023-06-04 NOTE — Progress Notes (Signed)
[facility] PROGRESS NOTE  CHRISIE BOYER (DOB: 1946/01/09) GLO:[id number] PCP: Etta Grandchild, MD  Brief Narrative: Brenda Bautista is a 77 y.o. female with a history of chronic HFpEF, HTN, HLD, asthma who presented to the ED on 06/03/2023 with generalized weakness having found to have elevated troponin and BNP, nonspecific T wave changes on ECG as an outpatient and referred by PCP.   Subjective: Son and DIL at bedside, pt still in ED this AM. No chest pain or dyspnea, wants to leave. Willing to stay for echo.   Objective: BP (!) 157/69   Pulse (!) 55   Temp 98.1 F (36.7 C)   Resp 15   Ht 4\' 11"  (1.499 m)   Wt 51.8 kg   SpO2 97%   BMI 23.07 kg/m   Gen: No distress Pulm: Clear, nonlabored  CV: Regular borderline bradycardia, II/VI murmur at cardiac base, systolic, no rub or gallop or edema or JVD GI: Soft, NT, ND, +BS  Neuro: Alert and oriented. No new focal deficits. Ext: Warm, no deformities. Skin: No rashes, lesions or ulcers on visualized skin   Assessment & Plan: Elevated troponin: Nondiagnostic. Tn has plateaued, staying between 38-41. No ongoing chest pain. - Await echo to inform next step in work up per cardiology. This still hasn't been done this evening, so will stay overnight tonight.   HTN:  - BP remains elevated, will initiate norvasc 5mg   Bradycardia: TSH wnl. - Avoid BB.   Tyrone Nine, MD [facility] [email or web address] 06/04/2023, 6:08 PM

## 2023-06-04 NOTE — ED Notes (Signed)
Pt alert, NAD, calm, interactive, denies sx or complaints, pending echo, "ready to go home", family x2 at Bs.

## 2023-06-04 NOTE — Care Management Obs Status (Signed)
MEDICARE OBSERVATION STATUS NOTIFICATION   Patient Details  Name: Brenda Bautista MRN: [id number] Date of Birth: June 21, 1945   Medicare Observation Status Notification Given:  Yes  Notice was given telephonically to [phone] by Gilda Crease RNCM to Son of patient Brenda Bautista. . Explained the MOON letter , son verbalized understanding and is in agreement to Verbal permission to sign .A copy will be presented to the room  Lockie Pares, RN 06/04/2023, 10:19 AM

## 2023-06-04 NOTE — Progress Notes (Signed)
DAILY PROGRESS NOTE   Patient Name: Brenda Bautista Date of Encounter: 06/04/2023 Cardiologist: None  Chief Complaint   No complaints  Patient Profile   Brenda Bautista is a 77 y.o. female without cardiac history who is being seen today for the evaluation of abnormal cardiac enzymes  at the request of Dr. Eloise Harman.   Subjective   Bradycardic overnight - BP has improved after hydralazine. Troponin remained flat minimally elevated - BNP also borderline.  Echo pending today.  Dr. Antoine Poche has recommended a 3 day Zio at discharge. Hypokalemia has been repleted.  Family at the bedside.  She is awaiting her echo and wants to go home.  Objective   Vitals:   06/04/23 0320 06/04/23 0435 06/04/23 0600 06/04/23 0702  BP: 138/71 (!) 142/59 (!) 119/56   Pulse: (!) 57 (!) 55 (!) 49   Resp: 12 16 16    Temp: 98.6 F (37 C)   98.6 F (37 C)  TempSrc: Oral     SpO2: 95% 95% 94%   Weight:      Height:       No intake or output data in the 24 hours ending 06/04/23 0751 Filed Weights   06/03/23 1423  Weight: 52.6 kg    Physical Exam   General appearance: alert and no distress Neck: no carotid bruit, no JVD, and thyroid not enlarged, symmetric, no tenderness/mass/nodules Lungs: clear to auscultation bilaterally Heart: Regular bradycardia, 2 out of 6 systolic ejection murmur at the right upper sternal border Abdomen: soft, non-tender; bowel sounds normal; no masses,  no organomegaly Extremities: extremities normal, atraumatic, no cyanosis or edema Pulses: 2+ and symmetric Skin: Skin color, texture, turgor normal. No rashes or lesions Neurologic: Grossly normal Psych: Pleasant  Inpatient Medications    Scheduled Meds:  potassium chloride  40 mEq Oral Once    Continuous Infusions:   PRN Meds: acetaminophen **OR** acetaminophen, albuterol, hydrALAZINE, melatonin, ondansetron (ZOFRAN) IV   Labs   Results for orders placed or performed during the hospital  encounter of 06/03/23 (from the past 48 hours)  Basic metabolic panel     Status: Abnormal   Collection Time: 06/03/23  2:25 PM  Result Value Ref Range   Sodium 138 135 - 145 mmol/L   Potassium 3.2 (L) 3.5 - 5.1 mmol/L   Chloride 104 98 - 111 mmol/L   CO2 28 22 - 32 mmol/L   Glucose, Bld 138 (H) 70 - 99 mg/dL    Comment: Glucose reference range applies only to samples taken after fasting for at least 8 hours.   BUN 13 8 - 23 mg/dL   Creatinine, Ser 1.02 (H) 0.44 - 1.00 mg/dL   Calcium 9.4 8.9 - 72.5 mg/dL   GFR, Estimated 55 (L) >60 mL/min    Comment: (NOTE) Calculated using the CKD-EPI Creatinine Equation (2021)    Anion gap 6 5 - 15    Comment: Performed at [facility], [address], [place], [place] [address]  CBC     Status: None   Collection Time: 06/03/23  2:25 PM  Result Value Ref Range   WBC 6.4 4.0 - 10.5 K/uL   RBC 4.13 3.87 - 5.11 MIL/uL   Hemoglobin 12.6 12.0 - 15.0 g/dL   HCT 03.4 74.2 - 59.5 %   MCV 91.0 80.0 - 100.0 fL   MCH 30.5 26.0 - 34.0 pg   MCHC 33.5 30.0 - 36.0 g/dL   RDW 63.8 75.6 - 43.3 %  Platelets 313 150 - 400 K/uL   nRBC 0.0 0.0 - 0.2 %    Comment: Performed at [facility], [address] [place], [place] [address]  Troponin I (High Sensitivity)     Status: Abnormal   Collection Time: 06/03/23  2:25 PM  Result Value Ref Range   Troponin I (High Sensitivity) 39 (H) <18 ng/L    Comment: (NOTE) Elevated high sensitivity troponin I (hsTnI) values and significant  changes across serial measurements may suggest ACS but many other  chronic and acute conditions are known to elevate hsTnI results.  Refer to the "Links" section for chest pain algorithms and additional  guidance. Performed at [facility], [address] [place], [place] [address]   TSH     Status: None   Collection Time: 06/03/23  6:36 PM  Result Value Ref Range   TSH 2.713 0.350 - 4.500 uIU/mL    Comment: Performed by a 3rd Generation assay with a  functional sensitivity of <=0.01 uIU/mL. Performed at [facility], [address] [place], [place] [address]   Troponin I (High Sensitivity)     Status: Abnormal   Collection Time: 06/03/23  6:37 PM  Result Value Ref Range   Troponin I (High Sensitivity) 41 (H) <18 ng/L    Comment: (NOTE) Elevated high sensitivity troponin I (hsTnI) values and significant  changes across serial measurements may suggest ACS but many other  chronic and acute conditions are known to elevate hsTnI results.  Refer to the "Links" section for chest pain algorithms and additional  guidance. Performed at [facility], [address] [place], [place] [address]   Magnesium     Status: None   Collection Time: 06/03/23  6:50 PM  Result Value Ref Range   Magnesium 1.9 1.7 - 2.4 mg/dL    Comment: Performed at [facility], [address] [place], [place] [address]  CBC with Differential/Platelet     Status: None   Collection Time: 06/04/23  4:33 AM  Result Value Ref Range   WBC 6.7 4.0 - 10.5 K/uL   RBC 3.99 3.87 - 5.11 MIL/uL   Hemoglobin 12.3 12.0 - 15.0 g/dL   HCT 10.6 26.9 - 48.5 %   MCV 91.5 80.0 - 100.0 fL   MCH 30.8 26.0 - 34.0 pg   MCHC 33.7 30.0 - 36.0 g/dL   RDW 46.2 70.3 - 50.0 %   Platelets 248 150 - 400 K/uL   nRBC 0.0 0.0 - 0.2 %   Neutrophils Relative % 60 %   Neutro Abs 4.0 1.7 - 7.7 K/uL   Lymphocytes Relative 26 %   Lymphs Abs 1.8 0.7 - 4.0 K/uL   Monocytes Relative 12 %   Monocytes Absolute 0.8 0.1 - 1.0 K/uL   Eosinophils Relative 1 %   Eosinophils Absolute 0.1 0.0 - 0.5 K/uL   Basophils Relative 1 %   Basophils Absolute 0.1 0.0 - 0.1 K/uL   Immature Granulocytes 0 %   Abs Immature Granulocytes 0.01 0.00 - 0.07 K/uL    Comment: Performed at [facility], [address] [place], [place] [address]  Comprehensive metabolic panel     Status: Abnormal   Collection Time: 06/04/23  4:33 AM  Result Value Ref Range   Sodium 139 135 - 145 mmol/L   Potassium  3.9 3.5 - 5.1 mmol/L   Chloride 108 98 - 111 mmol/L   CO2 22 22 -  32 mmol/L   Glucose, Bld 94 70 - 99 mg/dL    Comment: Glucose reference range applies only to samples taken after fasting for at least 8 hours.   BUN 16 8 - 23 mg/dL   Creatinine, Ser 0.10 0.44 - 1.00 mg/dL   Calcium 9.0 8.9 - 27.2 mg/dL   Total Protein 6.1 (L) 6.5 - 8.1 g/dL   Albumin 3.4 (L) 3.5 - 5.0 g/dL   AST 25 15 - 41 U/L   ALT 16 0 - 44 U/L   Alkaline Phosphatase 78 38 - 126 U/L   Total Bilirubin 1.4 (H) <1.2 mg/dL   GFR, Estimated >53 >66 mL/min    Comment: (NOTE) Calculated using the CKD-EPI Creatinine Equation (2021)    Anion gap 9 5 - 15    Comment: Performed at [facility], [address], [place], [place] [address]  Magnesium     Status: None   Collection Time: 06/04/23  4:33 AM  Result Value Ref Range   Magnesium 1.8 1.7 - 2.4 mg/dL    Comment: Performed at [facility], [address], [place], [place] [address]  Troponin I (High Sensitivity)     Status: Abnormal   Collection Time: 06/04/23  4:33 AM  Result Value Ref Range   Troponin I (High Sensitivity) 38 (H) <18 ng/L    Comment: (NOTE) Elevated high sensitivity troponin I (hsTnI) values and significant  changes across serial measurements may suggest ACS but many other  chronic and acute conditions are known to elevate hsTnI results.  Refer to the "Links" section for chest pain algorithms and additional  guidance. Performed at [facility], [address], [place], [place] [address]     ECG   Ordered but not yet performed  Telemetry   Sinus bradycardia - Personally Reviewed  Radiology    DG Chest 2 View Result Date: 06/03/2023 CLINICAL DATA:  Chest pain. EXAM: CHEST - 2 VIEW COMPARISON:  08/18/2020. FINDINGS: There are mildly increased interstitial markings, which are nonspecific. No overt pulmonary edema. Bilateral lung fields are clear. No acute consolidation or lung collapse. Bilateral costophrenic angles are  clear. Normal cardio-mediastinal silhouette. No acute osseous abnormalities. The soft tissues are within normal limits. IMPRESSION: No active cardiopulmonary disease. Electronically Signed   By: Jules Schick M.D.   On: 06/03/2023 17:06    Cardiac Studies   Echo pending  Assessment   Principal Problem:   Elevated troponin Active Problems:   Essential hypertension   Mild intermittent asthma   Generalized weakness   Hypokalemia   Chronic diastolic CHF (congestive heart failure) (HCC)   Plan   Ms. Standridge had an elevated outpatient troponin that was ordered without symptoms of chest pain.  This is flat mildly abnormal.  Low suspicion for ACS.  Repeat EKG has been ordered.  She has maintained sinus rhythm or sinus bradycardia overnight with nonspecific T wave changes.  T waves appear improved.  I ordered a repeat twelve-lead EKG.  She does have a systolic murmur which may be mild aortic stenosis or aortic sclerosis.  Echo is pending.  Blood pressure improved today.  She was given hydralazine.  Could benefit from low-dose amlodipine if blood pressure rises again.  Otherwise would recommend a 3-day monitor at discharge.  Likely could be discharged today if her echo does not show any severe findings such as significant cardiomyopathy, severe valvular disease or regional wall motion abnormalities.  Time Spent Directly with Patient:  I  have spent a total of 25 minutes with the patient reviewing hospital notes, telemetry, EKGs, labs and examining the patient as well as establishing an assessment and plan that was discussed personally with the patient.  > 50% of time was spent in direct patient care.  Length of Stay:  LOS: 0 days   Chrystie Nose, MD, [facility], FACP    [facility]  Medical Director of the [facility]  Cardiovascular Risk Reduction Clinic Diplomate of the American Board of Clinical Lipidology Attending Cardiologist  Direct Dial: [phone]   Fax: [phone]  Website:  [email or web address] 06/04/2023, 7:51 AM

## 2023-06-04 NOTE — Progress Notes (Signed)
OT Cancellation Note  Patient Details Name: Brenda Bautista MRN: [id number] DOB: 05-20-1946   Cancelled Treatment:    Reason Eval/Treat Not Completed: OT screened, no needs identified, will sign off  Suzanna Obey 06/04/2023, 9:51 AM  06/04/2023  RP, OTR/L  Acute Rehabilitation Services  Office:  [phone]

## 2023-06-05 ENCOUNTER — Observation Stay (HOSPITAL_BASED_OUTPATIENT_CLINIC_OR_DEPARTMENT_OTHER): Payer: 59

## 2023-06-05 DIAGNOSIS — I10 Essential (primary) hypertension: Secondary | ICD-10-CM | POA: Diagnosis not present

## 2023-06-05 DIAGNOSIS — R7989 Other specified abnormal findings of blood chemistry: Secondary | ICD-10-CM

## 2023-06-05 DIAGNOSIS — I5032 Chronic diastolic (congestive) heart failure: Secondary | ICD-10-CM | POA: Diagnosis not present

## 2023-06-05 DIAGNOSIS — R9431 Abnormal electrocardiogram [ECG] [EKG]: Secondary | ICD-10-CM | POA: Diagnosis not present

## 2023-06-05 LAB — ECHOCARDIOGRAM COMPLETE
Area-P 1/2: 3.49 cm2
Calc EF: 72.7 %
Height: 59 in
P 1/2 time: 753 ms
S' Lateral: 2 cm
Single Plane A2C EF: 70.7 %
Single Plane A4C EF: 73.3 %
Weight: 1827.17 [oz_av]

## 2023-06-05 MED ORDER — AMLODIPINE BESYLATE 5 MG PO TABS: 5.0000 mg | ORAL_TABLET | Freq: Every day | ORAL | 0 refills | Status: DC

## 2023-06-05 NOTE — Progress Notes (Addendum)
DAILY PROGRESS NOTE   Patient Name: Brenda Bautista Date of Encounter: 06/05/2023 Cardiologist: None  Chief Complaint   No complaints  Patient Profile   Brenda Bautista is a 77 y.o. female without cardiac history who is being seen today for the evaluation of abnormal cardiac enzymes  at the request of Dr. Eloise Harman.   Subjective   Echo personally reviewed today - there is moderate basal septal hypertrophy without clear HOCM physiology - LVEF 70-75%, no regional WMA's - aortic sclerosis with trivial AI. Blood pressure appears controlled today.   Objective   Vitals:   06/04/23 1946 06/05/23 0045 06/05/23 0637 06/05/23 0800  BP: 132/72 129/69 136/74 128/68  Pulse: 62 (!) 58 (!) 58 (!) 56  Resp: 16 17 15 15   Temp: 98.1 F (36.7 C) 98.4 F (36.9 C) (!) 97.3 F (36.3 C) 98 F (36.7 C)  TempSrc: Oral Oral Oral Oral  SpO2: 95% 95% 95% 95%  Weight:      Height:        Intake/Output Summary (Last 24 hours) at 06/05/2023 1005 Last data filed at 06/05/2023 0800 Gross per 24 hour  Intake 614 ml  Output --  Net 614 ml   Filed Weights   06/03/23 1423 06/04/23 1512  Weight: 52.6 kg 51.8 kg    Physical Exam   General appearance: alert and no distress Neck: no carotid bruit, no JVD, and thyroid not enlarged, symmetric, no tenderness/mass/nodules Lungs: clear to auscultation bilaterally Heart: Regular bradycardia, 2 out of 6 systolic ejection murmur at the right upper sternal border Abdomen: soft, non-tender; bowel sounds normal; no masses,  no organomegaly Extremities: extremities normal, atraumatic, no cyanosis or edema Pulses: 2+ and symmetric Skin: Skin color, texture, turgor normal. No rashes or lesions Neurologic: Grossly normal Psych: Pleasant  Inpatient Medications    Scheduled Meds:  amLODipine  5 mg Oral Daily   potassium chloride  40 mEq Oral Once    Continuous Infusions:   PRN Meds: acetaminophen **OR** acetaminophen, albuterol,  hydrALAZINE, melatonin, ondansetron (ZOFRAN) IV   Labs   Results for orders placed or performed during the hospital encounter of 06/03/23 (from the past 48 hours)  Basic metabolic panel     Status: Abnormal   Collection Time: 06/03/23  2:25 PM  Result Value Ref Range   Sodium 138 135 - 145 mmol/L   Potassium 3.2 (L) 3.5 - 5.1 mmol/L   Chloride 104 98 - 111 mmol/L   CO2 28 22 - 32 mmol/L   Glucose, Bld 138 (H) 70 - 99 mg/dL    Comment: Glucose reference range applies only to samples taken after fasting for at least 8 hours.   BUN 13 8 - 23 mg/dL   Creatinine, Ser 9.56 (H) 0.44 - 1.00 mg/dL   Calcium 9.4 8.9 - 21.3 mg/dL   GFR, Estimated 55 (L) >60 mL/min    Comment: (NOTE) Calculated using the CKD-EPI Creatinine Equation (2021)    Anion gap 6 5 - 15    Comment: Performed at [facility], [address], [place], [place] [address]  CBC     Status: None   Collection Time: 06/03/23  2:25 PM  Result Value Ref Range   WBC 6.4 4.0 - 10.5 K/uL   RBC 4.13 3.87 - 5.11 MIL/uL   Hemoglobin 12.6 12.0 - 15.0 g/dL   HCT 84.6 96.2 - 95.2 %   MCV 91.0 80.0 - 100.0 fL   MCH 30.5 26.0 - 34.0  pg   MCHC 33.5 30.0 - 36.0 g/dL   RDW 11.9 14.7 - 82.9 %   Platelets 313 150 - 400 K/uL   nRBC 0.0 0.0 - 0.2 %    Comment: Performed at [facility], [address], [place], [place] [address]  Troponin I (High Sensitivity)     Status: Abnormal   Collection Time: 06/03/23  2:25 PM  Result Value Ref Range   Troponin I (High Sensitivity) 39 (H) <18 ng/L    Comment: (NOTE) Elevated high sensitivity troponin I (hsTnI) values and significant  changes across serial measurements may suggest ACS but many other  chronic and acute conditions are known to elevate hsTnI results.  Refer to the "Links" section for chest pain algorithms and additional  guidance. Performed at [facility], [address] [place], [place] [address]   TSH     Status: None   Collection Time: 06/03/23  6:36 PM   Result Value Ref Range   TSH 2.713 0.350 - 4.500 uIU/mL    Comment: Performed by a 3rd Generation assay with a functional sensitivity of <=0.01 uIU/mL. Performed at [facility], [address] [place], [place] [address]   Troponin I (High Sensitivity)     Status: Abnormal   Collection Time: 06/03/23  6:37 PM  Result Value Ref Range   Troponin I (High Sensitivity) 41 (H) <18 ng/L    Comment: (NOTE) Elevated high sensitivity troponin I (hsTnI) values and significant  changes across serial measurements may suggest ACS but many other  chronic and acute conditions are known to elevate hsTnI results.  Refer to the "Links" section for chest pain algorithms and additional  guidance. Performed at [facility], [address], [place], [place] [address]   Magnesium     Status: None   Collection Time: 06/03/23  6:50 PM  Result Value Ref Range   Magnesium 1.9 1.7 - 2.4 mg/dL    Comment: Performed at [facility], [address] [place], [place] [address]  CBC with Differential/Platelet     Status: None   Collection Time: 06/04/23  4:33 AM  Result Value Ref Range   WBC 6.7 4.0 - 10.5 K/uL   RBC 3.99 3.87 - 5.11 MIL/uL   Hemoglobin 12.3 12.0 - 15.0 g/dL   HCT 40.1 02.7 - 25.3 %   MCV 91.5 80.0 - 100.0 fL   MCH 30.8 26.0 - 34.0 pg   MCHC 33.7 30.0 - 36.0 g/dL   RDW 66.4 40.3 - 47.4 %   Platelets 248 150 - 400 K/uL   nRBC 0.0 0.0 - 0.2 %   Neutrophils Relative % 60 %   Neutro Abs 4.0 1.7 - 7.7 K/uL   Lymphocytes Relative 26 %   Lymphs Abs 1.8 0.7 - 4.0 K/uL   Monocytes Relative 12 %   Monocytes Absolute 0.8 0.1 - 1.0 K/uL   Eosinophils Relative 1 %   Eosinophils Absolute 0.1 0.0 - 0.5 K/uL   Basophils Relative 1 %   Basophils Absolute 0.1 0.0 - 0.1 K/uL   Immature Granulocytes 0 %   Abs Immature Granulocytes 0.01 0.00 - 0.07 K/uL    Comment: Performed at [facility], [address] [place], [place] [address]  Comprehensive metabolic panel     Status:  Abnormal   Collection Time: 06/04/23  4:33 AM  Result Value Ref Range   Sodium 139 135 - 145 mmol/L   Potassium  3.9 3.5 - 5.1 mmol/L   Chloride 108 98 - 111 mmol/L   CO2 22 22 - 32 mmol/L   Glucose, Bld 94 70 - 99 mg/dL    Comment: Glucose reference range applies only to samples taken after fasting for at least 8 hours.   BUN 16 8 - 23 mg/dL   Creatinine, Ser 2.44 0.44 - 1.00 mg/dL   Calcium 9.0 8.9 - 01.0 mg/dL   Total Protein 6.1 (L) 6.5 - 8.1 g/dL   Albumin 3.4 (L) 3.5 - 5.0 g/dL   AST 25 15 - 41 U/L   ALT 16 0 - 44 U/L   Alkaline Phosphatase 78 38 - 126 U/L   Total Bilirubin 1.4 (H) <1.2 mg/dL   GFR, Estimated >27 >25 mL/min    Comment: (NOTE) Calculated using the CKD-EPI Creatinine Equation (2021)    Anion gap 9 5 - 15    Comment: Performed at [facility], [address], [place], [place] [address]  Magnesium     Status: None   Collection Time: 06/04/23  4:33 AM  Result Value Ref Range   Magnesium 1.8 1.7 - 2.4 mg/dL    Comment: Performed at [facility], [address] [place], [place] [address]  Troponin I (High Sensitivity)     Status: Abnormal   Collection Time: 06/04/23  4:33 AM  Result Value Ref Range   Troponin I (High Sensitivity) 38 (H) <18 ng/L    Comment: (NOTE) Elevated high sensitivity troponin I (hsTnI) values and significant  changes across serial measurements may suggest ACS but many other  chronic and acute conditions are known to elevate hsTnI results.  Refer to the "Links" section for chest pain algorithms and additional  guidance. Performed at [facility], [address], [place], [place] [address]   Vitamin B12     Status: None   Collection Time: 06/04/23  4:42 PM  Result Value Ref Range   Vitamin B-12 524 180 - 914 pg/mL    Comment: (NOTE) This assay is not validated for testing neonatal or myeloproliferative syndrome specimens for Vitamin B12 levels. Performed at [facility], [address], [place],  [place] [address]     ECG   Ordered but not yet performed  Telemetry   Sinus bradycardia - Personally Reviewed  Radiology    DG Chest 2 View Result Date: 06/03/2023 CLINICAL DATA:  Chest pain. EXAM: CHEST - 2 VIEW COMPARISON:  08/18/2020. FINDINGS: There are mildly increased interstitial markings, which are nonspecific. No overt pulmonary edema. Bilateral lung fields are clear. No acute consolidation or lung collapse. Bilateral costophrenic angles are clear. Normal cardio-mediastinal silhouette. No acute osseous abnormalities. The soft tissues are within normal limits. IMPRESSION: No active cardiopulmonary disease. Electronically Signed   By: Jules Schick M.D.   On: 06/03/2023 17:06    Cardiac Studies   Echo pending  Assessment   Principal Problem:   Elevated troponin Active Problems:   Essential hypertension   Mild intermittent asthma   Generalized weakness   Hypokalemia   Chronic diastolic CHF (congestive heart failure) (HCC)   Plan   Ms. Standridge has basal septal hypertrophy on echo, but no clear HOCM. BP controlled on amlodipine - avoid diuretics. No BB d/t bradycardia. Appears euvolemic. I suspect elevated troponin related to diastolic heart failure. Ok to [employer] home today from cardiology standpoint.  Follow-up with Dr. Antoine Poche or APP after discharge. I have messaged our schedulers to reach out  to her next week. No need for monitoring at discharge.  Time Spent Directly with Patient:  I have spent a total of 25 minutes with the patient reviewing hospital notes, telemetry, EKGs, labs and examining the patient as well as establishing an assessment and plan that was discussed personally with the patient.  > 50% of time was spent in direct patient care.  Length of Stay:  LOS: 0 days   Chrystie Nose, MD, [facility], FACP  [place]  [facility]  Medical Director of the [facility]  [facility] Diplomate of the American Board of  Clinical Lipidology Attending Cardiologist  Direct Dial: [phone]  Fax: [phone]  Website:  [email or web address] 06/05/2023, 10:05 AM

## 2023-06-05 NOTE — Plan of Care (Signed)
  Problem: Education: Goal: Knowledge of General Education information will improve Description: Including pain rating scale, medication(s)/side effects and non-pharmacologic comfort measures Outcome: Adequate for Discharge   

## 2023-06-05 NOTE — Discharge Summary (Signed)
Physician Discharge Summary  Brenda Bautista NUU:[id number] DOB: 12-Nov-1945 DOA: 06/03/2023  PCP: Etta Grandchild, MD  Admit date: 06/03/2023 Discharge date: 06/05/2023  Time spent: 45 minutes  Recommendations for Outpatient Follow-up:  Cardiology Dr. Antoine Poche or APP in 2 weeks   Discharge Diagnoses:  Principal Problem:   Elevated troponin Active Problems:   Essential hypertension   Mild intermittent asthma   Generalized weakness   Hypokalemia   Chronic diastolic CHF (congestive heart failure) [facility])   Discharge Condition: Improved  Diet recommendation heart healthy  Filed Weights   06/03/23 1423 06/04/23 1512  Weight: 52.6 kg 51.8 kg    History of present illness:  77 y.o. female with a history of chronic HFpEF, HTN, HLD, asthma who presented to the ED on 06/03/2023 with generalized weakness having found to have elevated troponin and BNP, nonspecific T wave changes on ECG as an outpatient and referred by PCP.   Hospital Course:   Elevated troponin:  -Unclear why this was checked, she had no cardiac symptoms -High-sensitivity troponin was mildly elevated at 38, 41, no chest pain, no dyspnea on exertion -Seen by cardiology in consultation, echo today noted basal septal hypertrophy, no clear HOCM, EF preserved -Recommended to continue amlodipine, follow-up with Dr. Antoine Poche or APP in few weeks   HTN:  - BP modestly elevated, started on Norvasc   Mild sinus bradycardia: TSH wnl. - Avoid BB.   Discharge Exam: Vitals:   06/05/23 0800 06/05/23 1105  BP: 128/68 122/64  Pulse: (!) 56 60  Resp: 15 16  Temp: 98 F (36.7 C) 98.1 F (36.7 C)  SpO2: 95% 95%   Gen: Awake, Alert, Oriented X 3,  HEENT: no JVD Lungs: Good air movement bilaterally, CTAB CVS: S1S2/RRR Abd: soft, Non tender, non distended, BS present Extremities: No edema Skin: no new rashes on exposed skin   Discharge Instructions   Discharge Instructions     Diet - low sodium heart healthy    Complete by: As directed    Increase activity slowly   Complete by: As directed       Allergies as of 06/05/2023       Reactions   Codeine    Bactrim [sulfamethoxazole-trimethoprim] Rash   Flagyl [metronidazole] Rash   Meloxicam Other (See Comments)   Abdominal pain        Medication List     TAKE these medications    albuterol 108 (90 Base) MCG/ACT inhaler Commonly known as: VENTOLIN HFA Inhale 2 puffs into the lungs every 6 (six) hours as needed for wheezing or shortness of breath.   amLODipine 5 MG tablet Commonly known as: NORVASC Take 1 tablet (5 mg total) by mouth daily. Start taking on: June 06, 2023       Allergies  Allergen Reactions   Codeine    Bactrim [Sulfamethoxazole-Trimethoprim] Rash   Flagyl [Metronidazole] Rash   Meloxicam Other (See Comments)    Abdominal pain       The results of significant diagnostics from this hospitalization (including imaging, microbiology, ancillary and laboratory) are listed below for reference.    Significant Diagnostic Studies: ECHOCARDIOGRAM COMPLETE Result Date: 06/05/2023    ECHOCARDIOGRAM REPORT   Patient Name:   Brenda Bautista Date of Exam: 06/05/2023 Medical Rec #:  [id number]             Height:       59.0 in Accession #:    [id number]  Weight:       114.2 lb Date of Birth:  November 10, 1945             BSA:          1.453 m Patient Age:    77 years              BP:           136/74 mmHg Patient Gender: F                     HR:           63 bpm. Exam Location:  Inpatient Procedure: 2D Echo, Color Doppler and Cardiac Doppler Indications:    Elevated Troponin  History:        Patient has no prior history of Echocardiogram examinations.                 CHF, Arrythmias:Bradycardia; Risk Factors:Hypertension.  Sonographer:    Webb Laws Referring Phys: [id number] HAO MENG IMPRESSIONS  1. Left ventricular ejection fraction, by estimation, is 70 to 75%. Left ventricular ejection fraction by 2D MOD  biplane is 72.7 %. The left ventricle has hyperdynamic function. The left ventricle has no regional wall motion abnormalities. There is moderate asymmetric left ventricular hypertrophy of the basal-septal segment. Left ventricular diastolic parameters are consistent with Grade I diastolic dysfunction (impaired relaxation).  2. Right ventricular systolic function is normal. The right ventricular size is normal. Tricuspid regurgitation signal is inadequate for assessing PA pressure.  3. The mitral valve is grossly normal. Trivial mitral valve regurgitation.  4. The aortic valve is tricuspid. Aortic valve regurgitation is trivial. Aortic valve sclerosis is present, with no evidence of aortic valve stenosis. Aortic regurgitation PHT measures 753 msec.  5. The inferior vena cava is normal in size with greater than 50% respiratory variability, suggesting right atrial pressure of 3 mmHg. Comparison(s): No prior Echocardiogram. FINDINGS  Left Ventricle: Left ventricular ejection fraction, by estimation, is 70 to 75%. Left ventricular ejection fraction by 2D MOD biplane is 72.7 %. The left ventricle has hyperdynamic function. The left ventricle has no regional wall motion abnormalities. The left ventricular internal cavity size was normal in size. There is moderate asymmetric left ventricular hypertrophy of the basal-septal segment. Left ventricular diastolic parameters are consistent with Grade I diastolic dysfunction (impaired relaxation). Indeterminate filling pressures. Right Ventricle: The right ventricular size is normal. No increase in right ventricular wall thickness. Right ventricular systolic function is normal. Tricuspid regurgitation signal is inadequate for assessing PA pressure. Left Atrium: Left atrial size was normal in size. Right Atrium: Right atrial size was normal in size. Pericardium: There is no evidence of pericardial effusion. Mitral Valve: The mitral valve is grossly normal. Trivial mitral valve  regurgitation. Tricuspid Valve: The tricuspid valve is normal in structure. Tricuspid valve regurgitation is not demonstrated. Aortic Valve: The aortic valve is tricuspid. Aortic valve regurgitation is trivial. Aortic regurgitation PHT measures 753 msec. Aortic valve sclerosis is present, with no evidence of aortic valve stenosis. Pulmonic Valve: The pulmonic valve was normal in structure. Pulmonic valve regurgitation is not visualized. Aorta: The aortic root and ascending aorta are structurally normal, with no evidence of dilitation. Venous: The inferior vena cava is normal in size with greater than 50% respiratory variability, suggesting right atrial pressure of 3 mmHg. IAS/Shunts: No atrial level shunt detected by color flow Doppler.  LEFT VENTRICLE PLAX 2D  Biplane EF (MOD) LVIDd:         3.50 cm         LV Biplane EF:   Left LVIDs:         2.00 cm                          ventricular LV PW:         0.70 cm                          ejection LV IVS:        1.40 cm                          fraction by LVOT diam:     1.50 cm                          2D MOD LV SV:         50                               biplane is LV SV Index:   34                               72.7 %. LVOT Area:     1.77 cm                                Diastology                                LV e' medial:    3.70 cm/s LV Volumes (MOD)               LV E/e' medial:  21.7 LV vol d, MOD    39.3 ml       LV e' lateral:   7.40 cm/s A2C:                           LV E/e' lateral: 10.9 LV vol d, MOD    38.6 ml A4C: LV vol s, MOD    11.5 ml A2C: LV vol s, MOD    10.3 ml A4C: LV SV MOD A2C:   27.8 ml LV SV MOD A4C:   38.6 ml LV SV MOD BP:    28.9 ml RIGHT VENTRICLE             IVC RV Basal diam:  2.60 cm     IVC diam: 0.90 cm RV S prime:     12.00 cm/s TAPSE (M-mode): 3.0 cm LEFT ATRIUM             Index        RIGHT ATRIUM           Index LA diam:        2.80 cm 1.93 cm/m   RA Area:     10.90 cm LA Vol (A2C):   32.2 ml 22.15  ml/m  RA Volume:   20.70 ml  14.24 ml/m LA Vol (A4C):   28.8 ml 19.81 ml/m LA Biplane Vol: 30.7 ml  21.12 ml/m  AORTIC VALVE LVOT Vmax:   114.00 cm/s LVOT Vmean:  76.300 cm/s LVOT VTI:    0.281 m AI PHT:      753 msec  AORTA Ao Root diam: 2.80 cm Ao Asc diam:  3.20 cm MITRAL VALVE MV Area (PHT): 3.49 cm     SHUNTS MV E velocity: 80.40 cm/s   Systemic VTI:  0.28 m MV A velocity: 101.00 cm/s  Systemic Diam: 1.50 cm MV E/A ratio:  0.80 Zoila Shutter MD Electronically signed by Zoila Shutter MD Signature Date/Time: 06/05/2023/10:25:03 AM    Final    DG Chest 2 View Result Date: 06/03/2023 CLINICAL DATA:  Chest pain. EXAM: CHEST - 2 VIEW COMPARISON:  08/18/2020. FINDINGS: There are mildly increased interstitial markings, which are nonspecific. No overt pulmonary edema. Bilateral lung fields are clear. No acute consolidation or lung collapse. Bilateral costophrenic angles are clear. Normal cardio-mediastinal silhouette. No acute osseous abnormalities. The soft tissues are within normal limits. IMPRESSION: No active cardiopulmonary disease. Electronically Signed   By: Jules Schick M.D.   On: 06/03/2023 17:06    Microbiology: No results found for this or any previous visit (from the past 240 hours).   Labs: Basic Metabolic Panel: Recent Labs  Lab 06/03/23 1102 06/03/23 1425 06/03/23 1850 06/04/23 0433  NA 141 138  --  139  K 4.3 3.2*  --  3.9  CL 102 104  --  108  CO2 30 28  --  22  GLUCOSE 87 138*  --  94  BUN 15 13  --  16  CREATININE 0.97 1.04*  --  0.96  CALCIUM 9.7 9.4  --  9.0  MG  --   --  1.9 1.8   Liver Function Tests: Recent Labs  Lab 06/03/23 1102 06/04/23 0433  AST 29 25  ALT 16 16  ALKPHOS 102 78  BILITOT 1.9* 1.4*  PROT 7.4 6.1*  ALBUMIN 4.4 3.4*   No results for input(s): "LIPASE", "AMYLASE" in the last 168 hours. No results for input(s): "AMMONIA" in the last 168 hours. CBC: Recent Labs  Lab 06/03/23 1102 06/03/23 1425 06/04/23 0433  WBC 6.9 6.4 6.7   NEUTROABS 4.0  --  4.0  HGB 13.5 12.6 12.3  HCT 39.7 37.6 36.5  MCV 92.2 91.0 91.5  PLT 300.0 313 248   Cardiac Enzymes: No results for input(s): "CKTOTAL", "CKMB", "CKMBINDEX", "TROPONINI" in the last 168 hours. BNP: BNP (last 3 results) No results for input(s): "BNP" in the last 8760 hours.  ProBNP (last 3 results) Recent Labs    06/03/23 1102  PROBNP 123.0*    CBG: No results for input(s): "GLUCAP" in the last 168 hours.     Signed:  Zannie Cove MD.  [facility] 06/05/2023, 11:30 AM

## 2023-06-27 NOTE — Progress Notes (Unsigned)
  Cardiology Office Note:   Date:  06/29/2023  ID:  Brenda Bautista, DOB [birth year], MRN [id number] PCP: Etta Grandchild, MD  [facility] Providers Cardiologist:  Rollene Rotunda, MD {  History of Present Illness:   Brenda Bautista is a 78 y.o. female who was in the hospital in Dec with weakness and was found to have sinus bradycardia.  She was managed for HTN and heart failure with diastolic dysfunction.  Since she was in the hospital she has done well.  The patient denies any new symptoms such as chest discomfort, neck or arm discomfort. There has been no new shortness of breath, PND or orthopnea. There have been no reported palpitations, presyncope or syncope.   She does her vacuuming.  She has no limitations with activities.  ROS: As stated in the HPI and negative for all other systems.  Studies Reviewed:    EKG:   NA  Risk Assessment/Calculations:              Physical Exam:   VS:  BP 138/76   Pulse 61   Ht 4\' 11"  (1.499 m)   Wt 113 lb 9.6 oz (51.5 kg)   SpO2 97%   BMI 22.94 kg/m    Wt Readings from Last 3 Encounters:  06/29/23 113 lb 9.6 oz (51.5 kg)  06/04/23 114 lb 3.2 oz (51.8 kg)  06/03/23 116 lb (52.6 kg)     GEN: Well nourished, well developed in no acute distress NECK: No JVD; No carotid bruits CARDIAC: RRR, soft apical systolic murmur that does not increase with the strain phase of Valsalva, no diastolic murmurs, rubs, gallops RESPIRATORY:  Clear to auscultation without rales, wheezing or rhonchi  ABDOMEN: Soft, non-tender, non-distended EXTREMITIES:  No edema; No deformity   ASSESSMENT AND PLAN:    Bradycardia: The patient has no symptoms related to this.  No change in therapy.  Hypertension: Her blood pressure is well-controlled on the amlodipine.  No change in therapy.  Hypertrophic cardiomyopathy: The patient has nonobstructive septal thickening.  She has no symptoms that are high risk.  I did discuss this with her son and he is  already had echocardiography and can discuss it with his siblings.  No further testing is indicated.     Follow up with me as needed.   Signed, Rollene Rotunda, MD

## 2023-06-29 ENCOUNTER — Ambulatory Visit: Payer: 59 | Attending: Cardiology | Admitting: Cardiology

## 2023-06-29 ENCOUNTER — Encounter: Payer: Self-pay | Admitting: Cardiology

## 2023-06-29 VITALS — BP 138/76 | HR 61 | Ht 59.0 in | Wt 113.6 lb

## 2023-06-29 DIAGNOSIS — R001 Bradycardia, unspecified: Secondary | ICD-10-CM

## 2023-06-29 DIAGNOSIS — R531 Weakness: Secondary | ICD-10-CM | POA: Diagnosis not present

## 2023-06-29 NOTE — Patient Instructions (Signed)
Medication Instructions:  No changes.  *If you need a refill on your cardiac medications before your next appointment, please call your pharmacy*   Follow-Up: At [facility], you and your health needs are our priority.  As part of our continuing mission to provide you with exceptional heart care, we have created designated Provider Care Teams.  These Care Teams include your primary Cardiologist (physician) and Advanced Practice Providers (APPs -  Physician Assistants and Nurse Practitioners) who all work together to provide you with the care you need, when you need it.  We recommend signing up for the patient portal called "MyChart".  Sign up information is provided on this After Visit Summary.  MyChart is used to connect with patients for Virtual Visits (Telemedicine).  Patients are able to view lab/test results, encounter notes, upcoming appointments, etc.  Non-urgent messages can be sent to your provider as well.   To learn more about what you can do with MyChart, go to [email or web address].    Your next appointment:    As Needed.   Provider:   Rollene Rotunda, MD

## 2023-07-04 ENCOUNTER — Ambulatory Visit: Payer: 59 | Admitting: Internal Medicine

## 2023-07-04 VITALS — BP 134/82 | HR 86 | Temp 97.6°F | Ht 59.0 in | Wt 116.4 lb

## 2023-07-04 DIAGNOSIS — R413 Other amnesia: Secondary | ICD-10-CM | POA: Insufficient documentation

## 2023-07-04 DIAGNOSIS — I10 Essential (primary) hypertension: Secondary | ICD-10-CM

## 2023-07-04 DIAGNOSIS — E785 Hyperlipidemia, unspecified: Secondary | ICD-10-CM

## 2023-07-04 DIAGNOSIS — N1832 Chronic kidney disease, stage 3b: Secondary | ICD-10-CM

## 2023-07-04 MED ORDER — ATORVASTATIN CALCIUM 40 MG PO TABS: 40.0000 mg | ORAL_TABLET | Freq: Every day | ORAL | 3 refills | Status: AC

## 2023-07-04 MED ORDER — AMLODIPINE BESYLATE 5 MG PO TABS: 5.0000 mg | ORAL_TABLET | Freq: Every day | ORAL | 3 refills | Status: DC

## 2023-07-04 NOTE — Patient Instructions (Signed)
Ok to restart the lipitor for cholesterol  Ok to continue the amlodipine  Please continue all other medications as before.  Please have the pharmacy call with any other refills you may need.  Please continue your efforts at being more active, low cholesterol diet, and weight control.  Please keep your appointments with your specialists as you may have planned  You will be contacted regarding the referral for: MRI brain, and Neurology  Please see Dr Yetta Barre in March as you have scheduled, as you will need blood testing at that time

## 2023-07-04 NOTE — Progress Notes (Unsigned)
Patient ID: Brenda Bautista, female   DOB: 28-Aug-1945, 78 y.o.   MRN: [id number]        Chief Complaint: follow up HTN, HLD, memory changes, [id number]       HPI:  Brenda Bautista is a 78 y.o. female here with family, seen in ED recently with elevated sbp - started amlodipine 5 mg every day with good compliance and tolerating well, Has seen cardiology.   Pt denies chest pain, increased sob or doe, wheezing, orthopnea, PND, increased LE swelling, palpitations, dizziness or syncope.   Pt denies polydipsia, polyuria, or new focal neuro s/s.    Pt denies fever, wt loss, night sweats, loss of appetite, or other constitutional symptoms  Not taking statin recently.  Also has worsening memory changes recently in the past 3 months, mostly ST memory and asking same questions sevewral tmes.         Wt Readings from Last 3 Encounters:  07/04/23 116 lb 6 oz (52.8 kg)  06/29/23 113 lb 9.6 oz (51.5 kg)  06/04/23 114 lb 3.2 oz (51.8 kg)   BP Readings from Last 3 Encounters:  07/04/23 134/82  06/29/23 138/76  06/05/23 122/64         Past Medical History:  Diagnosis Date   Allergic rhinitis    Anxiety    Arthritis    Asthma    Cataract    Depression    Hyperlipemia    Hypertension    Sinusitis    Vitamin D deficiency    Past Surgical History:  Procedure Laterality Date   CARDIAC CATHETERIZATION N/A 12/19/2015   Procedure: Left Heart Cath and Coronary Angiography;  Surgeon: Marykay Lex, MD;  Location: [facility] INVASIVE CV LAB;  Service: Cardiovascular;  Laterality: N/A;   CATARACT EXTRACTION, BILATERAL Right 06/2015   CATARACT EXTRACTION, BILATERAL Left 07/2015   TRIGGER FINGER RELEASE     WISDOM TOOTH EXTRACTION      reports that she has never smoked. She has never used smokeless tobacco. She reports that she does not drink alcohol and does not use drugs. family history includes Asthma in her father; Breast cancer in her daughter; COPD in her brother and father; Coronary artery disease  (age of onset: 35) in her mother; Depression in her mother; Emphysema in her father; Hearing loss in her daughter; Heart attack in her father and mother; Hyperlipidemia in her brother, father, and mother; Hypertension in her brother, father, and mother; Mitral valve prolapse in her son; Sick sinus syndrome in her mother; Stroke in her mother. Allergies  Allergen Reactions   Codeine    Bactrim [Sulfamethoxazole-Trimethoprim] Rash   Flagyl [Metronidazole] Rash   Meloxicam Other (See Comments)    Abdominal pain    Current Outpatient Medications on File Prior to Visit  Medication Sig Dispense Refill   albuterol (PROVENTIL HFA;VENTOLIN HFA) 108 (90 Base) MCG/ACT inhaler Inhale 2 puffs into the lungs every 6 (six) hours as needed for wheezing or shortness of breath.     No current facility-administered medications on file prior to visit.        ROS:  All others reviewed and negative.  Objective        PE:  BP 134/82   Pulse 86   Temp 97.6 F (36.4 C) (Temporal)   Ht 4\' 11"  (1.499 m)   Wt 116 lb 6 oz (52.8 kg)   SpO2 98%   BMI 23.50 kg/m  Constitutional: Pt appears in NAD               HENT: Head: NCAT.                Right Ear: External ear normal.                 Left Ear: External ear normal.                Eyes: . Pupils are equal, round, and reactive to light. Conjunctivae and EOM are normal               Nose: without d/c or deformity               Neck: Neck supple. Gross normal ROM               Cardiovascular: Normal rate and regular rhythm.                 Pulmonary/Chest: Effort normal and breath sounds without rales or wheezing.                Abd:  Soft, NT, ND, + BS, no organomegaly               Neurological: Pt is alert. At baseline orientation, motor grossly intact               Skin: Skin is warm. No rashes, no other new lesions, LE edema - none               Psychiatric: Pt behavior is normal without agitation   Micro: none  Cardiac tracings I  have personally interpreted today:  none  Pertinent Radiological findings (summarize): none   Lab Results  Component Value Date   WBC 6.7 06/04/2023   HGB 12.3 06/04/2023   HCT 36.5 06/04/2023   PLT 248 06/04/2023   GLUCOSE 94 06/04/2023   CHOL 194 11/22/2022   TRIG 175.0 (H) 11/22/2022   HDL 55.80 11/22/2022   LDLCALC 103 (H) 11/22/2022   ALT 16 06/04/2023   AST 25 06/04/2023   NA 139 06/04/2023   K 3.9 06/04/2023   CL 108 06/04/2023   CREATININE 0.96 06/04/2023   BUN 16 06/04/2023   CO2 22 06/04/2023   TSH 2.713 06/03/2023   INR 1.15 12/18/2015   HGBA1C 5.8 11/22/2022   Assessment/Plan:  Brenda Bautista is a 78 y.o. White or Caucasian [1] female with  has a past medical history of Allergic rhinitis, Anxiety, Arthritis, Asthma, Cataract, Depression, Hyperlipemia, Hypertension, Sinusitis, and Vitamin D deficiency.  Essential hypertension BP Readings from Last 3 Encounters:  07/04/23 134/82  06/29/23 138/76  06/05/23 122/64   improved, pt to continue medical treatment norvasc 5 mg qd   Hyperlipidemia with target LDL less than 100 Lab Results  Component Value Date   LDLCALC 103 (H) 11/22/2022   Uncontrolled,  pt to restart statin lipitor 40 mg qd   Memory changes Mild to mod, for MRI brain, refer neurology,  to f/u any worsening symptoms or concerns  Stage 3b chronic kidney disease (HCC) Lab Results  Component Value Date   CREATININE 0.96 06/04/2023   Stable overall, cont to avoid nephrotoxins  Followup: Return in about 2 months (around 09/01/2023).  Oliver Barre, MD 07/07/2023 8:09 PM [facility]

## 2023-07-07 ENCOUNTER — Encounter: Payer: Self-pay | Admitting: Internal Medicine

## 2023-07-07 NOTE — Assessment & Plan Note (Signed)
Lab Results  Component Value Date   CREATININE 0.96 06/04/2023   Stable overall, cont to avoid nephrotoxins

## 2023-07-07 NOTE — Assessment & Plan Note (Signed)
BP Readings from Last 3 Encounters:  07/04/23 134/82  06/29/23 138/76  06/05/23 122/64   improved, pt to continue medical treatment norvasc 5 mg qd

## 2023-07-07 NOTE — Assessment & Plan Note (Signed)
Mild to mod, for MRI brain, refer neurology,  to f/u any worsening symptoms or concerns

## 2023-07-07 NOTE — Assessment & Plan Note (Signed)
Lab Results  Component Value Date   LDLCALC 103 (H) 11/22/2022   Uncontrolled,  pt to restart statin lipitor 40 mg qd

## 2023-07-12 ENCOUNTER — Encounter: Payer: Self-pay | Admitting: Physician Assistant

## 2023-07-21 ENCOUNTER — Encounter: Payer: Self-pay | Admitting: Internal Medicine

## 2023-07-21 MED ORDER — AMLODIPINE BESYLATE 5 MG PO TABS
5.0000 mg | ORAL_TABLET | Freq: Every day | ORAL | 0 refills | Status: AC
Start: 2023-07-21 — End: ?

## 2023-07-22 ENCOUNTER — Telehealth: Payer: Self-pay

## 2023-07-22 ENCOUNTER — Ambulatory Visit
Admission: RE | Admit: 2023-07-22 | Discharge: 2023-07-22 | Disposition: A | Discharge status: Home / Self Care | Payer: 59 | Source: Ambulatory Visit | Attending: Internal Medicine | Admitting: Internal Medicine

## 2023-07-22 DIAGNOSIS — R413 Other amnesia: Secondary | ICD-10-CM

## 2023-07-22 NOTE — Telephone Encounter (Signed)
Copied from CRM [id number]. Topic: Clinical - Prescription Issue >> Jul 22, 2023 10:46 AM Deaijah H wrote: Reason for CRM: Patient son Fayrene Fearing called in stating [facility] is saying they do not have patients prescription amLODipine (NORVASC) 5 MG tablet ' advised it was approved and sent yesterday.

## 2023-08-04 ENCOUNTER — Encounter: Payer: Self-pay | Admitting: Internal Medicine

## 2023-08-07 NOTE — Progress Notes (Addendum)
 Assessment/Plan:     Brenda Bautista is a very pleasant 78 y.o. year old RH female with a history of hypertension, hyperlipidemia, anxiety, depression,  asthma, vitamin D deficiency, seen today for evaluation of memory loss. MoCA today is 12/30 . Findings are concerning for Alzheimer's etiology.  Most recent MRI of the brain February 2025, personally reviewed remarkable for mild generalized parenchymal volume loss, mild hippocampal atrophy,  mild chronic microvascular changes, no acute findings.Patient is able to participate on ADLs  without difficulties, mood is good. Discussed initiating antidementia medication in an effort to slow down the memory decline, they agree to proceed.    Memory Impairment, concerned for Alzheimer's disease   Start Donepezil 10 mg :Take half tablet (5 mg) daily for 2 weeks, then increase to the full tablet at 10 mg daily. Side effects discussed   Recommend good control of cardiovascular risk factors.   Continue to control mood as per PCP Folllow up in 3 months   Subjective:    The patient is accompanied by her son who supplements the history.    How long did patient have memory difficulties?  Son reports that her memory issues have been present for about 2 years, worse over the last 3 months.  Patient reports that she shows more difficulty remembering new information, recent conversations, names. LTM is "ok in some aspects". Enjoys doing chores around the house, going to [employer], Dance movement psychotherapist and crossword puzzles.  repeats oneself?  Endorsed, especially with appointments. Disoriented when walking into a room?  Patient denies    Leaving objects in unusual places?  She may lose her books, she is less organized than before.    Wandering behavior? denies   Any personality changes, or depression, anxiety? She has a history of depression after the death of her husband. Never had psychotherapist. She has always talked to herself.  Hallucinations or paranoia? "She  reports that people come to the house take stuff". Occasionally she has felt the presence of someone Seizures? denies    Any sleep changes?  Sleeps well. Son denies, he reports that at times she tells her son that she may wake up several times a night.  Denies vivid dreams, REM behavior or sleepwalking   Sleep apnea? Denies.   Any hygiene concerns?  Denies.   Independent of bathing and dressing? Endorsed  Does the patient need help with medications? Son  is in charge   Who is in charge of the finances? Son is in charge    Any changes in appetite?   Denies, but son states that her appetite is decreased. Does not drink enough water.    Patient have trouble swallowing?  GERD.  Does the patient cook? Yes, she may have forgotten common recipes for the last 6 months. Denies any kitchen accidents Any headaches?  Denies.   Chronic pain? She has osteoporosis, followed by PCP Ambulates with difficulty? Denies  Recent falls or head injuries? Denies.     Vision changes?  Denies any new issues.  Any strokelike symptoms? Denies.   Any tremors? Denies.  Any anosmia? Denies.   Any incontinence of urine? Denies.   Any bowel dysfunction? Denies.      Patient lives alone with son's supervision.  History of heavy alcohol intake? Denies.   History of heavy tobacco use? Denies.   Family history of dementia?  Denies  Does patient drive? Never drove   Recent labs December 2024 B12 524, magnesium 1.8, normal CBC, TSH 2.713 Hot  wax candle maker.  MRI of the brain July 22 2023, reviewed by radiology on 08/04/2023, personally reviewed remarkable for mild generalized parenchymal volume loss, no disproportionate atrophy of the hippocampi or medial temporal lobes mild chronic microvascular changes, no acute findings.   Allergies  Allergen Reactions   Codeine    Bactrim [Sulfamethoxazole-Trimethoprim] Rash   Flagyl [Metronidazole] Rash   Meloxicam Other (See Comments)    Abdominal pain     Current  Outpatient Medications  Medication Instructions   albuterol (PROVENTIL HFA;VENTOLIN HFA) 108 (90 Base) MCG/ACT inhaler 2 puffs, Every 6 hours PRN   amLODipine (NORVASC) 5 mg, Oral, Daily   atorvastatin (LIPITOR) 40 mg, Oral, Daily   donepezil (ARICEPT) 10 MG tablet Take half tablet (5 mg) daily for 2 weeks, then increase to the full tablet at 10 mg daily   SYMBICORT 80-4.5 MCG/ACT inhaler SMARTSIG:2 Puff(s) Via Inhaler Morning-Night     VITALS:   Vitals:   08/08/23 0735  BP: (!) 152/90  Pulse: 60  Resp: 20  SpO2: 95%  Weight: 117 lb (53.1 kg)  Height: 4\' 11"  (1.499 m)      PHYSICAL EXAM   HEENT:  Normocephalic, atraumatic.  The superficial temporal arteries are without ropiness or tenderness. Cardiovascular: Regular rate and rhythm. Lungs: Clear to auscultation bilaterally. Neck: There are no carotid bruits noted bilaterally.  NEUROLOGICAL:    08/08/2023    8:00 AM  Montreal Cognitive Assessment   Visuospatial/ Executive (0/5) 1  Naming (0/3) 2  Attention: Read list of digits (0/2) 2  Attention: Read list of letters (0/1) 1  Attention: Serial 7 subtraction starting at 100 (0/3) 0  Language: Repeat phrase (0/2) 1  Language : Fluency (0/1) 0  Abstraction (0/2) 1  Delayed Recall (0/5) 0  Orientation (0/6) 3  Total 11  Adjusted Score (based on education) 12        No data to display           Orientation:  Alert and oriented to person, not to place or time.No aphasia or dysarthria. Fund of knowledge is appropriate. Recent and remote memory impaired.  Attention and concentration are reduced.  Able to name objects 2/3 and  repeat phrases1/2 . Delayed recall 0/5  Cranial nerves: There is good facial symmetry. Extraocular muscles are intact and visual fields are full to confrontational testing. Speech is fluent and clear. No tongue deviation. Hearing is intact to conversational tone.  Tone: Tone is good throughout. Sensation: Sensation is intact to light touch.   Vibration is intact at the bilateral big toe.  Coordination: The patient has no difficulty with RAM's or FNF bilaterally. Normal finger to nose  Motor: Strength is 5/5 in the bilateral upper and lower extremities. There is no pronator drift. There are no fasciculations noted. DTR's: Deep tendon reflexes are 2/4 bilaterally. Gait and Station: The patient is able to ambulate without difficulty. Gait is cautious and narrow. Stride length is normal        Thank you for allowing [place] the opportunity to participate in the care of this nice patient. Please do not hesitate to contact us for any questions or concerns.   Total time spent on today's visit was 64 minutes dedicated to this patient today, preparing to see patient, examining the patient, ordering tests and/or medications and counseling the patient, documenting clinical information in the EHR or other health record, independently interpreting results and communicating results to the patient/family, discussing treatment and goals, answering patient's questions and coordinating  care.  Cc:  Etta Grandchild, MD  Marlowe Kays 08/08/2023 8:36 AM

## 2023-08-08 ENCOUNTER — Ambulatory Visit (INDEPENDENT_AMBULATORY_CARE_PROVIDER_SITE_OTHER): Payer: 59 | Admitting: Physician Assistant

## 2023-08-08 ENCOUNTER — Encounter: Payer: Self-pay | Admitting: Physician Assistant

## 2023-08-08 ENCOUNTER — Ambulatory Visit: Payer: 59

## 2023-08-08 VITALS — BP 140/75 | HR 60 | Resp 20 | Ht 59.0 in | Wt 117.0 lb

## 2023-08-08 DIAGNOSIS — R413 Other amnesia: Secondary | ICD-10-CM | POA: Diagnosis not present

## 2023-08-08 MED ORDER — DONEPEZIL HCL 10 MG PO TABS: ORAL_TABLET | ORAL | 3 refills | Status: DC

## 2023-08-08 NOTE — Patient Instructions (Addendum)
 It was a pleasure to see you today at our office.   Recommendations:  We will start donepezil half tablet (5mg ) daily for 2  weeks. then increase the dose to a full tablet of 10 mg daily.    Follow up in 3  months Recommend visiting the website : " [facility]" to better understand some behaviors related to memory loss.    For psychiatric meds, mood meds: Please have your primary care physician manage these medications.  If you have any severe symptoms of a stroke, or other severe issues such as confusion,severe chills or fever, etc call 911 or go to the ER as you may need to be evaluated further   For guidance regarding [facility] and if placement were needed at the facility, contact Social Worker tel: [phone]  For assessment of decision of mental capacity and competency:  Call Dr. Erick Blinks, geriatric psychiatrist at [phone]  Counseling regarding caregiver distress, including caregiver depression, anxiety and issues regarding community resources, adult day care programs, adult living facilities, or memory care questions:  please contact  or Social Worker   Whom to call: Memory  decline, memory medications: Call our office [phone]    [email or web address]   RECOMMENDATIONS FOR ALL PATIENTS WITH MEMORY PROBLEMS: 1. Continue to exercise (Recommend 30 minutes of walking everyday, or 3 hours every week) 2. Increase social interactions - continue going to [place] and enjoy social gatherings with friends and family 3. Eat healthy, avoid fried foods and eat more fruits and vegetables 4. Maintain adequate blood pressure, blood sugar, and blood cholesterol level. Reducing the risk of stroke and cardiovascular disease also helps promoting better memory. 5. Avoid stressful situations. Live a simple life and avoid aggravations. Organize your time and prepare for the next day in anticipation. 6.  Sleep well, avoid any interruptions of sleep and avoid any distractions in the bedroom that may interfere with adequate sleep quality 7. Avoid sugar, avoid sweets as there is a strong link between excessive sugar intake, diabetes, and cognitive impairment We discussed the Mediterranean diet, which has been shown to help patients reduce the risk of progressive memory disorders and reduces cardiovascular risk. This includes eating fish, eat fruits and green leafy vegetables, nuts like almonds and hazelnuts, walnuts, and also use olive oil. Avoid fast foods and fried foods as much as possible. Avoid sweets and sugar as sugar use has been linked to worsening of memory function.  There is always a concern of gradual progression of memory problems. If this is the case, then we may need to adjust level of care according to patient needs. Support, both to the patient and caregiver, should then be put into place.            FALL PRECAUTIONS: Be cautious when walking. Scan the area for obstacles that may increase the risk of trips and falls. When getting up in the mornings, sit up at the edge of the bed for a few minutes before getting out of bed. Consider elevating the bed at the head end to avoid drop of blood pressure when getting up. Walk always in a well-lit room (use night lights in the walls). Avoid area rugs or power cords from appliances in the middle of the walkways. Use a walker or a cane if necessary and consider physical therapy for balance exercise. Get your eyesight checked regularly.  FINANCIAL OVERSIGHT: Supervision, especially oversight when making financial decisions or transactions is also recommended.  HOME SAFETY: Consider  the safety of the kitchen when operating appliances like stoves, microwave oven, and blender. Consider having supervision and share cooking responsibilities until no longer able to participate in those. Accidents with firearms and other hazards in the house should be  identified and addressed as well.   ABILITY TO BE LEFT ALONE: If patient is unable to contact 911 operator, consider using LifeLine, or when the need is there, arrange for someone to stay with patients. Smoking is a fire hazard, consider supervision or cessation. Risk of wandering should be assessed by caregiver and if detected at any point, supervision and safe proof recommendations should be instituted.  MEDICATION SUPERVISION: Inability to self-administer medication needs to be constantly addressed. Implement a mechanism to ensure safe administration of the medications.      Mediterranean Diet A Mediterranean diet refers to food and lifestyle choices that are based on the traditions of countries located on the [employer]. This way of eating has been shown to help prevent certain conditions and improve outcomes for people who have chronic diseases, like kidney disease and heart disease. What are tips for following this plan? Lifestyle  Cook and eat meals together with your family, when possible. Drink enough fluid to keep your urine clear or pale yellow. Be physically active every day. This includes: Aerobic exercise like running or swimming. Leisure activities like gardening, walking, or housework. Get 7-8 hours of sleep each night. If recommended by your health care provider, drink red wine in moderation. This means 1 glass a day for nonpregnant women and 2 glasses a day for men. A glass of wine equals 5 oz (150 mL). Reading food labels  Check the serving size of packaged foods. For foods such as rice and pasta, the serving size refers to the amount of cooked product, not dry. Check the total fat in packaged foods. Avoid foods that have saturated fat or trans fats. Check the ingredients list for added sugars, such as corn syrup. Shopping  At the grocery store, buy most of your food from the areas near the walls of the store. This includes: Fresh fruits and vegetables  (produce). Grains, beans, nuts, and seeds. Some of these may be available in unpackaged forms or large amounts (in bulk). Fresh seafood. Poultry and eggs. Low-fat dairy products. Buy whole ingredients instead of prepackaged foods. Buy fresh fruits and vegetables in-season from local farmers markets. Buy frozen fruits and vegetables in resealable bags. If you do not have access to quality fresh seafood, buy precooked frozen shrimp or canned fish, such as tuna, salmon, or sardines. Buy small amounts of raw or cooked vegetables, salads, or olives from the deli or salad bar at your store. Stock your pantry so you always have certain foods on hand, such as olive oil, canned tuna, canned tomatoes, rice, pasta, and beans. Cooking  Cook foods with extra-virgin olive oil instead of using butter or other vegetable oils. Have meat as a side dish, and have vegetables or grains as your main dish. This means having meat in small portions or adding small amounts of meat to foods like pasta or stew. Use beans or vegetables instead of meat in common dishes like chili or lasagna. Experiment with different cooking methods. Try roasting or broiling vegetables instead of steaming or sauteing them. Add frozen vegetables to soups, stews, pasta, or rice. Add nuts or seeds for added healthy fat at each meal. You can add these to yogurt, salads, or vegetable dishes. Marinate fish or vegetables using olive oil,  lemon juice, garlic, and fresh herbs. Meal planning  Plan to eat 1 vegetarian meal one day each week. Try to work up to 2 vegetarian meals, if possible. Eat seafood 2 or more times a week. Have healthy snacks readily available, such as: Vegetable sticks with hummus. Greek yogurt. Fruit and nut trail mix. Eat balanced meals throughout the week. This includes: Fruit: 2-3 servings a day Vegetables: 4-5 servings a day Low-fat dairy: 2 servings a day Fish, poultry, or lean meat: 1 serving a day Beans and  legumes: 2 or more servings a week Nuts and seeds: 1-2 servings a day Whole grains: 6-8 servings a day Extra-virgin olive oil: 3-4 servings a day Limit red meat and sweets to only a few servings a month What are my food choices? Mediterranean diet Recommended Grains: Whole-grain pasta. Brown rice. Bulgar wheat. Polenta. Couscous. Whole-wheat bread. Orpah Cobb. Vegetables: Artichokes. Beets. Broccoli. Cabbage. Carrots. Eggplant. Green beans. Chard. Kale. Spinach. Onions. Leeks. Peas. Squash. Tomatoes. Peppers. Radishes. Fruits: Apples. Apricots. Avocado. Berries. Bananas. Cherries. Dates. Figs. Grapes. Lemons. Melon. Oranges. Peaches. Plums. Pomegranate. Meats and other protein foods: Beans. Almonds. Sunflower seeds. Pine nuts. Peanuts. Cod. Salmon. Scallops. Shrimp. Tuna. Tilapia. Clams. Oysters. Eggs. Dairy: Low-fat milk. Cheese. Greek yogurt. Beverages: Water. Red wine. Herbal tea. Fats and oils: Extra virgin olive oil. Avocado oil. Grape seed oil. Sweets and desserts: [place] yogurt with honey. Baked apples. Poached pears. Trail mix. Seasoning and other foods: Basil. Cilantro. Coriander. Cumin. Mint. Parsley. Sage. Rosemary. Tarragon. Garlic. Oregano. Thyme. Pepper. Balsalmic vinegar. Tahini. Hummus. Tomato sauce. Olives. Mushrooms. Limit these Grains: Prepackaged pasta or rice dishes. Prepackaged cereal with added sugar. Vegetables: Deep fried potatoes (french fries). Fruits: Fruit canned in syrup. Meats and other protein foods: Beef. Pork. Lamb. Poultry with skin. Hot dogs. Tomasa Blase. Dairy: Ice cream. Sour cream. Whole milk. Beverages: Juice. Sugar-sweetened soft drinks. Beer. Liquor and spirits. Fats and oils: Butter. Canola oil. Vegetable oil. Beef fat (tallow). Lard. Sweets and desserts: Cookies. Cakes. Pies. Candy. Seasoning and other foods: Mayonnaise. Premade sauces and marinades. The items listed may not be a complete list. Talk with your dietitian about what dietary choices  are right for you. Summary The Mediterranean diet includes both food and lifestyle choices. Eat a variety of fresh fruits and vegetables, beans, nuts, seeds, and whole grains. Limit the amount of red meat and sweets that you eat. Talk with your health care provider about whether it is safe for you to drink red wine in moderation. This means 1 glass a day for nonpregnant women and 2 glasses a day for men. A glass of wine equals 5 oz (150 mL). This information is not intended to replace advice given to you by your health care provider. Make sure you discuss any questions you have with your health care provider. Document Released: 01/15/2016 Document Revised: 02/17/2016 Document Reviewed: 01/15/2016 Elsevier Interactive Patient Education  2017 [employer].

## 2023-09-01 ENCOUNTER — Ambulatory Visit: Payer: 59 | Admitting: Internal Medicine

## 2023-09-26 ENCOUNTER — Ambulatory Visit (INDEPENDENT_AMBULATORY_CARE_PROVIDER_SITE_OTHER): Admitting: Internal Medicine

## 2023-09-26 ENCOUNTER — Encounter: Payer: Self-pay | Admitting: Internal Medicine

## 2023-09-26 VITALS — BP 134/62 | HR 55 | Temp 98.0°F | Ht 59.0 in | Wt 110.2 lb

## 2023-09-26 DIAGNOSIS — N1832 Chronic kidney disease, stage 3b: Secondary | ICD-10-CM

## 2023-09-26 DIAGNOSIS — E785 Hyperlipidemia, unspecified: Secondary | ICD-10-CM | POA: Diagnosis not present

## 2023-09-26 DIAGNOSIS — I10 Essential (primary) hypertension: Secondary | ICD-10-CM | POA: Diagnosis not present

## 2023-09-26 DIAGNOSIS — K219 Gastro-esophageal reflux disease without esophagitis: Secondary | ICD-10-CM

## 2023-09-26 DIAGNOSIS — R001 Bradycardia, unspecified: Secondary | ICD-10-CM | POA: Diagnosis not present

## 2023-09-26 DIAGNOSIS — R9431 Abnormal electrocardiogram [ECG] [EKG]: Secondary | ICD-10-CM

## 2023-09-26 LAB — LIPID PANEL
Cholesterol: 170 mg/dL (ref 0–200)
HDL: 73.6 mg/dL (ref >39.00)
LDL Cholesterol: 67 mg/dL (ref 0–99)
NonHDL: 96.66
Total CHOL/HDL Ratio: 2
Triglycerides: 148 mg/dL (ref 0.0–149.0)
VLDL: 29.6 mg/dL (ref 0.0–40.0)

## 2023-09-26 LAB — CBC WITH DIFFERENTIAL/PLATELET
Basophils Absolute: 0.1 10*3/uL (ref 0.0–0.1)
Basophils Relative: 0.7 % (ref 0.0–3.0)
Eosinophils Absolute: 0.1 10*3/uL (ref 0.0–0.7)
Eosinophils Relative: 1 % (ref 0.0–5.0)
HCT: 41.1 % (ref 36.0–46.0)
Hemoglobin: 13.7 g/dL (ref 12.0–15.0)
Lymphocytes Relative: 22.6 % (ref 12.0–46.0)
Lymphs Abs: 2.1 10*3/uL (ref 0.7–4.0)
MCHC: 33.4 g/dL (ref 30.0–36.0)
MCV: 93.1 fl (ref 78.0–100.0)
Monocytes Absolute: 0.7 10*3/uL (ref 0.1–1.0)
Monocytes Relative: 7.9 % (ref 3.0–12.0)
Neutro Abs: 6.3 10*3/uL (ref 1.4–7.7)
Neutrophils Relative %: 67.8 % (ref 43.0–77.0)
Platelets: 287 10*3/uL (ref 150.0–400.0)
RBC: 4.41 Mil/uL (ref 3.87–5.11)
RDW: 14.4 % (ref 11.5–15.5)
WBC: 9.3 10*3/uL (ref 4.0–10.5)

## 2023-09-26 LAB — BASIC METABOLIC PANEL WITH GFR
BUN: 17 mg/dL (ref 6–23)
CO2: 32 meq/L (ref 19–32)
Calcium: 10.4 mg/dL (ref 8.4–10.5)
Chloride: 98 meq/L (ref 96–112)
Creatinine, Ser: 1.05 mg/dL (ref 0.40–1.20)
GFR: 50.36 mL/min — ABNORMAL LOW (ref >60.00)
Glucose, Bld: 96 mg/dL (ref 70–99)
Potassium: 4 meq/L (ref 3.5–5.1)
Sodium: 139 meq/L (ref 135–145)

## 2023-09-26 NOTE — Patient Instructions (Signed)
 Bradycardia, Adult Bradycardia is a slower-than-normal heartbeat. A normal resting heart rate for an adult ranges from 60 to 100 beats per minute. With bradycardia, the resting heart rate is less than 60 beats per minute. Bradycardia can prevent enough oxygen from reaching certain areas of your body when you are active. It can be serious if it keeps enough oxygen from reaching your brain and other parts of your body. Bradycardia is not a problem for everyone. For some healthy adults, a slow resting heart rate is normal. What are the causes? This condition may be caused by: A problem with the heart, including: A problem with the heart's electrical system, such as a heart block. With a heart block, electrical signals between the chambers of the heart are partially or completely blocked, so they are not able to work as they should. A problem with the heart's natural pacemaker (sinus node). Heart disease. A heart attack. Heart damage. Lyme disease. A heart infection. A heart condition that is present at birth (congenital heart defect). Certain medicines that treat heart conditions. Certain conditions, such as hypothyroidism and obstructive sleep apnea. Problems with the balance of chemicals and other substances, like potassium, in the blood. Trauma. Radiation therapy. What increases the risk? You are more likely to develop this condition if you: Are age 80 or older. Have high blood pressure (hypertension), high cholesterol (hyperlipidemia), or diabetes. Drink heavily, use tobacco or nicotine products, or use drugs. What are the signs or symptoms? Symptoms of this condition include: Light-headedness. Feeling faint or fainting. Fatigue and weakness. Trouble with activity or exercise. Shortness of breath. Chest pain (angina). Drowsiness. Confusion. Dizziness. How is this diagnosed? This condition may be diagnosed based on: Your symptoms. Your medical history. A physical exam. During  the exam, your health care provider will listen to your heartbeat and check your pulse. To confirm the diagnosis, your health care provider may order tests, such as: Blood tests. An electrocardiogram (ECG). This test records the heart's electrical activity. The test can show how fast your heart is beating and whether the heartbeat is steady. A test in which you wear a portable device (event recorder or Holter monitor) to record your heart's electrical activity while you go about your day. An exercise test. How is this treated? Treatment for this condition depends on the cause of the condition and how severe your symptoms are. Treatment may involve: Treatment of the underlying condition. Changing your medicines or how much medicine you take. Having a small, battery-operated device called a pacemaker implanted under the skin. When bradycardia occurs, this device can be used to increase your heart rate and help your heart beat in a regular rhythm. Follow these instructions at home: Lifestyle Manage any health conditions that contribute to bradycardia as told by your health care provider. Follow a heart-healthy diet. A nutrition specialist (dietitian) can help educate you about healthy food options and changes. Follow an exercise program that is approved by your health care provider. Maintain a healthy weight. Try to reduce or manage your stress, such as with yoga or meditation. If you need help reducing stress, ask your health care provider. Do not use any products that contain nicotine or tobacco. These products include cigarettes, chewing tobacco, and vaping devices, such as e-cigarettes. If you need help quitting, ask your health care provider. Do not use illegal drugs. Alcohol use If you drink alcohol: Limit how much you have to: 0-1 drink a day for women who are not pregnant. 0-2 drinks a day  for men. Know how much alcohol is in a drink. In the [place] one drink equals one 12 oz bottle of  beer (355 mL), one 5 oz glass of wine (148 mL), or one 1 oz glass of hard liquor (44 mL). General instructions Take over-the-counter and prescription medicines only as told by your health care provider. Keep all follow-up visits. This is important. How is this prevented? In some cases, bradycardia may be prevented by: Treating underlying medical problems. Stopping behaviors or medicines that can trigger the condition. Contact a health care provider if: You feel light-headed or dizzy. You almost faint. You feel weak or are easily fatigued during physical activity. You experience confusion or have memory problems. Get help right away if: You faint. You have chest pains or an irregular heartbeat (palpitations). You have trouble breathing. These symptoms may represent a serious problem that is an emergency. Do not wait to see if the symptoms will go away. Get medical help right away. Call your local emergency services (911 in the [place] Do not drive yourself to the hospital. Summary Bradycardia is a slower-than-normal heartbeat. With bradycardia, the resting heart rate is less than 60 beats per minute. Treatment for this condition depends on the cause. Manage any health conditions that contribute to bradycardia as told by your health care provider. Do not use any products that contain nicotine or tobacco. These products include cigarettes, chewing tobacco, and vaping devices, such as e-cigarettes. Keep all follow-up visits. This is important. This information is not intended to replace advice given to you by your health care provider. Make sure you discuss any questions you have with your health care provider. Document Revised: 09/14/2020 Document Reviewed: 09/14/2020 Elsevier Patient Education  2024 [employer].

## 2023-09-26 NOTE — Progress Notes (Signed)
 Subjective:  Patient ID: Brenda Bautista, female    DOB: 1945/11/29  Age: 78 y.o. MRN: [id number]  CC: Hypertension   HPI Brenda Bautista presents for f/up ---  Discussed the use of AI scribe software for clinical note transcription with the patient, who gave verbal consent to proceed.  History of Present Illness   Brenda Bautista is a 78 year old female with dementia who presents with dizziness and lightheadedness.  She experiences dizziness and lightheadedness, although she initially denies these symptoms. Her caregiver confirms these symptoms, noting her reluctance to admit them due to a desire to avoid interventions. There have been no recent falls, and the dizziness and lightheadedness are consistent with previous episodes.  She has lost seven pounds in the last month, attributed to not eating when alone. Her caregiver observes that she eats well when accompanied but refuses to eat or drink water when alone. She prefers to drink [facility], consuming one 16-ounce bottle per day, and is resistant to drinking water unless flavored with [facility] No trouble breathing, chest pain, shortness of breath, or swelling in her legs or feet. Her caregiver suspects dehydration, as she often feels unwell and it is difficult to obtain blood from her finger for glucose testing.       Outpatient Medications Prior to Visit  Medication Sig Dispense Refill   albuterol  (PROVENTIL  HFA;VENTOLIN  HFA) 108 (90 Base) MCG/ACT inhaler Inhale 2 puffs into the lungs every 6 (six) hours as needed for wheezing or shortness of breath.     amLODipine  (NORVASC ) 5 MG tablet Take 1 tablet (5 mg total) by mouth daily. 30 tablet 0   atorvastatin  (LIPITOR ) 40 MG tablet Take 1 tablet (40 mg total) by mouth daily. 90 tablet 3   donepezil  (ARICEPT ) 10 MG tablet Take half tablet (5 mg) daily for 2 weeks, then increase to the full tablet at 10 mg daily 90 tablet 3   SYMBICORT  80-4.5 MCG/ACT inhaler Inhale  2 puffs into the lungs as needed.     No facility-administered medications prior to visit.    ROS Review of Systems  Constitutional:  Positive for unexpected weight change (wt loss). Negative for appetite change, chills, diaphoresis and fatigue.  HENT: Negative.    Eyes: Negative.   Respiratory:  Negative for cough, chest tightness, shortness of breath and wheezing.   Cardiovascular:  Negative for chest pain, palpitations and leg swelling.  Gastrointestinal:  Negative for abdominal pain, constipation, diarrhea, nausea and vomiting.  Endocrine: Negative.   Genitourinary: Negative.  Negative for difficulty urinating.  Musculoskeletal: Negative.  Negative for arthralgias.  Skin: Negative.   Neurological:  Positive for dizziness and light-headedness. Negative for weakness and numbness.  Hematological:  Negative for adenopathy. Does not bruise/bleed easily.  Psychiatric/Behavioral:  Positive for decreased concentration and dysphoric mood.     Objective:  BP 134/62 (BP Location: Left Arm, Patient Position: Sitting, Cuff Size: Small)   Pulse (!) 55   Temp 98 F (36.7 C) (Oral)   Ht 4\' 11"  (1.499 m)   Wt 110 lb 3.2 oz (50 kg)   SpO2 96%   BMI 22.26 kg/m   BP Readings from Last 3 Encounters:  09/26/23 134/62  08/08/23 (!) 140/75  07/04/23 134/82    Wt Readings from Last 3 Encounters:  09/26/23 110 lb 3.2 oz (50 kg)  08/08/23 117 lb (53.1 kg)  07/04/23 116 lb 6 oz (52.8 kg)    Physical Exam Vitals reviewed.  Constitutional:  Appearance: Normal appearance.  HENT:     Mouth/Throat:     Mouth: Mucous membranes are moist.  Eyes:     General: No scleral icterus.    Conjunctiva/sclera: Conjunctivae normal.  Cardiovascular:     Rate and Rhythm: Regular rhythm. Bradycardia present.     Heart sounds: Murmur heard.     Systolic murmur is present with a grade of 2/6.     No diastolic murmur is present.     Comments: EKG---- SB, 56 bpm TWI in lateral leads ? Q wave in  V1 unchanged  Pulmonary:     Effort: Pulmonary effort is normal.     Breath sounds: No stridor. No wheezing, rhonchi or rales.  Abdominal:     General: Abdomen is flat.     Palpations: There is no mass.     Tenderness: There is no abdominal tenderness. There is no guarding.     Hernia: No hernia is present.  Musculoskeletal:     Cervical back: Neck supple.     Right lower leg: No edema.     Left lower leg: No edema.  Lymphadenopathy:     Cervical: No cervical adenopathy.  Skin:    General: Skin is warm and dry.  Neurological:     Mental Status: She is alert. Mental status is at baseline.     Lab Results  Component Value Date   WBC 9.3 09/26/2023   HGB 13.7 09/26/2023   HCT 41.1 09/26/2023   PLT 287.0 09/26/2023   GLUCOSE 96 09/26/2023   CHOL 170 09/26/2023   TRIG 148.0 09/26/2023   HDL 73.60 09/26/2023   LDLCALC 67 09/26/2023   ALT 16 06/04/2023   AST 25 06/04/2023   NA 139 09/26/2023   K 4.0 09/26/2023   CL 98 09/26/2023   CREATININE 1.05 09/26/2023   BUN 17 09/26/2023   CO2 32 09/26/2023   TSH 2.713 06/03/2023   INR 1.15 12/18/2015   HGBA1C 5.8 11/22/2022    MR Brain Wo Contrast Result Date: 08/04/2023 CLINICAL DATA:  Memory loss EXAM: MRI HEAD WITHOUT CONTRAST TECHNIQUE: Multiplanar, multiecho pulse sequences of the brain and surrounding structures were obtained without intravenous contrast. COMPARISON:  None Available. FINDINGS: Brain: Vascular: Skull base flow voids are visualized. Skull and upper cervical spine: No focal abnormality. Sinuses/Orbits: Orbits are symmetric. Mild mucosal thickening noted in the maxillary sinuses. Other: Mastoid air cells are clear. IMPRESSION: *No acute intracranial abnormality. *Mild generalized parenchymal volume loss. No disproportionate atrophy of the hippocampi or medial temporal lobes. *Nonspecific scattered foci of FLAIR hyperintensity in the subcortical and periventricular white matter. Electronically Signed   By: Fredrich Jefferson M.D.   On: 08/04/2023 09:49    Assessment & Plan:   Bradycardia- She is asx. -     EKG 12-Lead  Essential hypertension- Her BP is well controlled. -     CBC with Differential/Platelet; Future -     Basic metabolic panel with GFR; Future  Stage 3b chronic kidney disease (HCC)- Will avoid nephrotoxic agents  -     Basic metabolic panel with GFR; Future -     Farxiga ; Take 1 tablet (10 mg total) by mouth daily before breakfast.  Dispense: 90 tablet; Refill: 1  Abnormal electrocardiogram (ECG) (EKG)  Gastroesophageal reflux disease without esophagitis -     CBC with Differential/Platelet; Future  Hyperlipidemia with target LDL less than 100 -     Lipid panel; Future     Follow-up: Return in about 6  months (around 03/27/2024).  Sandra Crouch, MD

## 2023-09-27 ENCOUNTER — Encounter: Payer: Self-pay | Admitting: Physician Assistant

## 2023-09-27 ENCOUNTER — Encounter: Payer: Self-pay | Admitting: Internal Medicine

## 2023-09-27 MED ORDER — FARXIGA 10 MG PO TABS
10.0000 mg | ORAL_TABLET | Freq: Every day | ORAL | 1 refills | Status: DC
Start: 2023-09-27 — End: 2023-12-07

## 2023-09-29 ENCOUNTER — Other Ambulatory Visit: Payer: Self-pay | Admitting: Internal Medicine

## 2023-10-27 ENCOUNTER — Ambulatory Visit (INDEPENDENT_AMBULATORY_CARE_PROVIDER_SITE_OTHER)

## 2023-10-27 ENCOUNTER — Encounter: Payer: Self-pay | Admitting: Emergency Medicine

## 2023-10-27 ENCOUNTER — Ambulatory Visit (INDEPENDENT_AMBULATORY_CARE_PROVIDER_SITE_OTHER): Admitting: Emergency Medicine

## 2023-10-27 VITALS — BP 120/68 | HR 56 | Temp 98.4°F | Ht 59.0 in | Wt 108.0 lb

## 2023-10-27 VITALS — Ht 59.0 in | Wt 110.0 lb

## 2023-10-27 DIAGNOSIS — Z0000 Encounter for general adult medical examination without abnormal findings: Secondary | ICD-10-CM | POA: Diagnosis not present

## 2023-10-27 DIAGNOSIS — S300XXA Contusion of lower back and pelvis, initial encounter: Secondary | ICD-10-CM

## 2023-10-27 DIAGNOSIS — M545 Low back pain, unspecified: Secondary | ICD-10-CM

## 2023-10-27 MED ORDER — TRAMADOL HCL 50 MG PO TABS: 50.0000 mg | ORAL_TABLET | Freq: Three times a day (TID) | ORAL | 1 refills | Status: AC | PRN

## 2023-10-27 NOTE — Patient Instructions (Signed)
Contusion A contusion is a deep bruise. This is a result of an injury that causes bleeding under the skin. Symptoms of bruising include pain, swelling, and discolored skin. The skin may turn blue, purple, or yellow. Follow these instructions at home: Managing pain, stiffness, and swelling You may use RICE. This stands for: Resting. Icing. Compression, or putting pressure on the injured area. Elevating, or raising the injured area. To follow this method, do these actions: Rest the injured area. If told, put ice on the injured area. To do this: Put ice in a plastic bag. Place a towel between your skin and the bag. Leave the ice on for 20 minutes, 2-3 times per day. If your skin turns bright red, take off the ice right away to prevent skin damage. The risk of skin damage is higher if you cannot feel pain, heat, or cold. If told, apply compression on the injured area using an elastic bandage. Make sure the bandage is not too tight. If the area tingles or has a loss of feeling (numbness), remove it and put it back on as told by your doctor. If possible, elevate the injured area above the level of your heart while you are sitting or lying down.  General instructions Take over-the-counter and prescription medicines only as told by your doctor. Keep all follow-up visits. Your doctor may want to see how your contusion is healing with treatment. Contact a doctor if: Your symptoms do not get better after several days of treatment. Your symptoms get worse. You have trouble moving the injured area. Get help right away if: You have very bad pain. You have a loss of feeling (numbness) in a hand or foot. Your hand or foot turns pale or cold. This information is not intended to replace advice given to you by your health care provider. Make sure you discuss any questions you have with your health care provider. Document Revised: 11/09/2021 Document Reviewed: 11/09/2021 Elsevier Patient Education  2024  [employer].

## 2023-10-27 NOTE — Assessment & Plan Note (Signed)
 Pain management discussed Recommend Tylenol for mild to moderate pain and tramadol for moderate to severe pain

## 2023-10-27 NOTE — Progress Notes (Signed)
 Subjective:  Please attest and cosign this visit due to patients primary care provider not being in the office at the time the visit was completed.  (Pt of Dr Sandra Crouch)   Brenda Bautista is a 78 y.o. who presents for a Medicare Wellness preventive visit.  As a reminder, Annual Wellness Visits don't include a physical exam, and some assessments may be limited, especially if this visit is performed virtually. We may recommend an in-person follow-up visit with your provider if needed.  Visit Complete: In person  VideoDeclined- This patient declined Interactive audio and Acupuncturist. Therefore the visit was completed with audio only.  Persons Participating in Visit: Patient assisted by Missy, Baksh.  AWV Questionnaire: Yes: Patient Medicare AWV questionnaire was completed by the patient on 10/26/2023; I have confirmed that all information answered by patient is correct and no changes since this date.  Cardiac Risk Factors include: advanced age (>63men, >23 women);dyslipidemia;hypertension     Objective:     Today's Vitals   10/27/23 1303  Weight: 110 lb (49.9 kg)  Height: 4\' 11"  (1.499 m)   Body mass index is 22.22 kg/m.     10/27/2023    1:01 PM 08/08/2023    7:37 AM 06/04/2023    3:16 PM 06/03/2023    2:23 PM 12/18/2015   10:33 AM  Advanced Directives  Does Patient Have a Medical Advance Directive? Yes Yes Yes Yes No  Type of Estate agent of [place];Living will Healthcare Power of [employer] of [place];Living will Healthcare Power of Attorney   Does patient want to make changes to medical advance directive?  No - Patient declined No - Patient declined    Copy of Healthcare Power of Attorney in Chart? No - copy requested No - copy requested No - copy requested    Would patient like information on creating a medical advance directive?     No - patient declined information    Current Medications  (verified) Outpatient Encounter Medications as of 10/27/2023  Medication Sig   amLODipine  (NORVASC ) 5 MG tablet Take 1 tablet (5 mg total) by mouth daily.   atorvastatin  (LIPITOR ) 40 MG tablet Take 1 tablet (40 mg total) by mouth daily.   donepezil  (ARICEPT ) 10 MG tablet Take half tablet (5 mg) daily for 2 weeks, then increase to the full tablet at 10 mg daily   FARXIGA  10 MG TABS tablet Take 1 tablet (10 mg total) by mouth daily before breakfast.   SYMBICORT  80-4.5 MCG/ACT inhaler Inhale 2 puffs into the lungs as needed.   albuterol  (PROVENTIL  HFA;VENTOLIN  HFA) 108 (90 Base) MCG/ACT inhaler Inhale 2 puffs into the lungs every 6 (six) hours as needed for wheezing or shortness of breath. (Patient not taking: Reported on 10/27/2023)   No facility-administered encounter medications on file as of 10/27/2023.    Allergies (verified) Codeine, Bactrim [sulfamethoxazole-trimethoprim], Flagyl [metronidazole], and Meloxicam   History: Past Medical History:  Diagnosis Date   Allergic rhinitis    Anxiety    Arthritis    Asthma    Cataract    Depression    Hyperlipemia    Hypertension    Sinusitis    Vitamin D  deficiency    Past Surgical History:  Procedure Laterality Date   CARDIAC CATHETERIZATION N/A 12/19/2015   Procedure: Left Heart Cath and Coronary Angiography;  Surgeon: Arleen Lacer, MD;  Location: [facility] INVASIVE CV LAB;  Service: Cardiovascular;  Laterality: N/A;   CATARACT EXTRACTION, BILATERAL Right  06/2015   CATARACT EXTRACTION, BILATERAL Left 07/2015   TRIGGER FINGER RELEASE     WISDOM TOOTH EXTRACTION     Family History  Problem Relation Age of Onset   Coronary artery disease Mother 30       MI in early 102s   Sick sinus syndrome Mother        required PPM   Depression Mother    Heart attack Mother    Hyperlipidemia Mother    Hypertension Mother    Stroke Mother    Emphysema Father    Asthma Father    Heart attack Father    COPD Father    Hypertension Father     Hyperlipidemia Father    COPD Brother    Hyperlipidemia Brother    Hypertension Brother    Hearing loss Daughter    Breast cancer Daughter    Mitral valve prolapse Son    Social History   Socioeconomic History   Marital status: Widowed    Spouse name: Not on file   Number of children: 3   Years of education: 64   Highest education level: 12th grade  Occupational History   Not on file  Tobacco Use   Smoking status: Never    Passive exposure: Never   Smokeless tobacco: Never  Vaping Use   Vaping status: Never Used  Substance and Sexual Activity   Alcohol use: No   Drug use: No   Sexual activity: Not Currently  Other Topics Concern   Not on file  Social History Narrative   Widowed, spouse Rich Champ passed away Nov 09, 2014 (he was a former patient of Dr Villa Greaser and Roena Clark NP)   5 children, all live in [place]   OCCUPATION: retired, 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   Right handed   One floor home   3 sons   Lives alone   Retired   Drinks caffeine    Social Drivers of Health   Financial Resource Strain: Low Risk  (10/27/2023)   Overall Financial Resource Strain (CARDIA)    Difficulty of Paying Living Expenses: Not very hard  Food Insecurity: No Food Insecurity (10/27/2023)   Hunger Vital Sign    Worried About Running Out of Food in the Last Year: Never true    Ran Out of Food in the Last Year: Never true  Transportation Needs: No Transportation Needs (10/27/2023)   PRAPARE - Administrator, Civil Service (Medical): No    Lack of Transportation (Non-Medical): No  Physical Activity: Inactive (10/27/2023)   Exercise Vital Sign    Days of Exercise per Week: 0 days    Minutes of Exercise per Session: 0 min  Stress: Stress Concern Present (10/27/2023)   [employer] of Occupational Health - Occupational Stress Questionnaire    Feeling of Stress : To some extent  Social Connections: Moderately Isolated (10/27/2023)   Social Connection and Isolation Panel [NHANES]     Frequency of Communication with Friends and Family: More than three times a week    Frequency of Social Gatherings with Friends and Family: More than three times a week    Attends Religious Services: More than 4 times per year    Active Member of [employer] or Organizations: No    Attends Banker Meetings: Never    Marital Status: Widowed    Tobacco Counseling Counseling given: No    Clinical Intake:  Pre-visit preparation completed: Yes  Pain : No/denies pain     BMI -  recorded: 22.22 Nutritional Risks: None Diabetes: No  Lab Results  Component Value Date   HGBA1C 5.8 11/22/2022   HGBA1C 5.9 03/17/2021   HGBA1C 5.8 05/15/2020     How often do you need to have someone help you when you read instructions, pamphlets, or other written materials from your doctor or pharmacy?: 5 - Always (son assists)  Interpreter Needed?: No  Information entered by :: Kandy Orris, CMA   Activities of Daily Living     10/27/2023    1:09 PM 10/26/2023    1:27 PM  In your present state of health, do you have any difficulty performing the following activities:  Hearing? 0 0  Vision? 0 0  Difficulty concentrating or making decisions? 1 1  Walking or climbing stairs? 0 0  Dressing or bathing? 0 0  Doing errands, shopping? 1 1  Comment Son Leisure centre manager and eating ? Y Y  Comment Son assists   Using the Toilet? N N  In the past six months, have you accidently leaked urine? N N  Do you have problems with loss of bowel control? N N  Managing your Medications? Y Y  Comment Son assists sometimes   Managing your Finances? Colie Dawes  Comment Son Clinical biochemist or managing your Housekeeping? N N    Patient Care Team: Arcadio Knuckles, MD as PCP - General (Internal Medicine) Eilleen Grates, MD as PCP - Cardiology (Cardiology)  Indicate any recent Medical Services you may have received from other than [facility] providers in the past year (date may be approximate).      Assessment:    This is a routine wellness examination for [facility].  Hearing/Vision screen Hearing Screening - Comments:: Denies hearing difficulties   Vision Screening - Comments:: Wears rx glasses    Goals Addressed               This Visit's Progress     Patient Stated (pt-stated)        Patient stated she wants to stay active.       Depression Screen     10/27/2023    1:12 PM 09/26/2023    8:29 AM 06/03/2023   10:11 AM 11/22/2022    1:38 PM 09/17/2020    9:07 AM  PHQ 2/9 Scores  PHQ - 2 Score 0 2 0 0 0  PHQ- 9 Score 0 6 4 0     Fall Risk     10/27/2023    1:11 PM 10/26/2023    1:27 PM 09/26/2023    8:29 AM 08/08/2023    7:40 AM 11/22/2022    1:38 PM  Fall Risk   Falls in the past year? 1 0 0 0 0  Number falls in past yr: 0  0 0 0  Comment 1      Injury with Fall? 1  0 0 0  Comment appt scheduled for today w/Dr Sagardia at 3pm (back pain/swollen area)      Risk for fall due to :   No Fall Risks  No Fall Risks  Follow up Falls evaluation completed;Falls prevention discussed  Falls evaluation completed Falls evaluation completed Falls evaluation completed    MEDICARE RISK AT HOME:  Medicare Risk at Home Any stairs in or around the home?: Yes If so, are there any without handrails?: No Home free of loose throw rugs in walkways, pet beds, electrical cords, etc?: Yes Adequate lighting in your home to reduce risk of  falls?: Yes Life alert?: No Use of a cane, walker or w/c?: No Grab bars in the bathroom?: Yes Shower chair or bench in shower?: No Elevated toilet seat or a handicapped toilet?: No  TIMED UP AND GO:  Was the test performed?  No  Cognitive Function: Impaired: Patient has current diagnosis of cognitive impairment.    10/27/2023    1:15 PM  MMSE - Mini Mental State Exam  Not completed: Unable to complete      08/08/2023    8:00 AM  Montreal Cognitive Assessment   Visuospatial/ Executive (0/5) 1  Naming (0/3) 2  Attention: Read list of  digits (0/2) 2  Attention: Read list of letters (0/1) 1  Attention: Serial 7 subtraction starting at 100 (0/3) 0  Language: Repeat phrase (0/2) 1  Language : Fluency (0/1) 0  Abstraction (0/2) 1  Delayed Recall (0/5) 0  Orientation (0/6) 3  Total 11  Adjusted Score (based on education) 12      Immunizations Immunization History  Administered Date(s) Administered   Fluad Trivalent(High Dose 65+) 06/03/2023   Influenza Split 03/07/2002, 02/11/2012, 03/19/2013, 03/08/2015   Influenza, High Dose Seasonal PF 03/24/2016, 03/03/2020   Influenza,inj,Quad PF,6+ Mos 01/24/2017   Influenza,inj,Quad PF,6-35 Mos 02/09/2018   Influenza,inj,quad, With Preservative 04/09/2014, 04/04/2015   Influenza-Unspecified 02/19/2017, 04/20/2021   PFIZER(Purple Top)SARS-COV-2 Vaccination 06/30/2019, 07/21/2019, 03/13/2020   Pneumococcal Conjugate-13 10/22/2014   Pneumococcal Polysaccharide-23 06/24/2010, 09/17/2020   Td 03/05/1998, 06/24/2010   Zoster, Live 08/10/2011, 03/03/2020    Screening Tests Health Maintenance  Topic Date Due   Zoster Vaccines- Shingrix  (1 of 2) 09/26/1993   DTaP/Tdap/Td (3 - Tdap) 06/24/2020   COVID-19 Vaccine (4 - 2024-25 season) 02/06/2023   INFLUENZA VACCINE  01/06/2024   Medicare Annual Wellness (AWV)  10/26/2024   Pneumonia Vaccine 65+ Years old  Completed   DEXA SCAN  Completed   HPV VACCINES  Aged Out   Meningococcal B Vaccine  Aged Out   Colonoscopy  Discontinued   Hepatitis C Screening  Discontinued    Health Maintenance  Health Maintenance Due  Topic Date Due   Zoster Vaccines- Shingrix  (1 of 2) 09/26/1993   DTaP/Tdap/Td (3 - Tdap) 06/24/2020   COVID-19 Vaccine (4 - 2024-25 season) 02/06/2023   Health Maintenance Items Addressed:10/27/2023   Additional Screening:  Vision Screening: Recommended annual ophthalmology exams for early detection of glaucoma and other disorders of the eye.  Dental Screening: Recommended annual dental exams for proper oral  hygiene  Community Resource Referral / Chronic Care Management: CRR required this visit?  No   CCM required this visit?  No   Plan:    I have personally reviewed and noted the following in the patient's chart:   Medical and social history Use of alcohol, tobacco or illicit drugs  Current medications and supplements including opioid prescriptions. Patient is not currently taking opioid prescriptions. Functional ability and status Nutritional status Physical activity Advanced directives List of other physicians Hospitalizations, surgeries, and ER visits in previous 12 months Vitals Screenings to include cognitive, depression, and falls Referrals and appointments  In addition, I have reviewed and discussed with patient certain preventive protocols, quality metrics, and best practice recommendations. A written personalized care plan for preventive services as well as general preventive health recommendations were provided to patient.   Patria Bookbinder, CMA   10/27/2023   After Visit Summary: (MyChart) Due to this being a telephonic visit, the after visit summary with patients personalized plan was offered to patient  via MyChart   Notes: Nothing significant to report at this time.

## 2023-10-27 NOTE — Progress Notes (Signed)
 Brenda Bautista 78 y.o.   Chief Complaint  Patient presents with   Fall    Pt fell and hurt her back, patient states she is taking Tylenol     HISTORY OF PRESENT ILLNESS: This is a 78 y.o. female lost her balance and fell backwards yesterday.  Injured her left low back.  Complaining of pain since.  Tylenol  not helping much. Denies blood in the urine or blood in the stools.  Denies head or neck injury.  No other associated symptoms. Constant steady pain without radiation.  Sharp and not associated with any other symptoms.  Worse with movement.  Fall Pertinent negatives include no abdominal pain, fever, headaches, hematuria, nausea or vomiting.     Prior to Admission medications   Medication Sig Start Date End Date Taking? Authorizing Provider  amLODipine  (NORVASC ) 5 MG tablet Take 1 tablet (5 mg total) by mouth daily. 07/21/23  Yes Roslyn Coombe, MD  atorvastatin  (LIPITOR ) 40 MG tablet Take 1 tablet (40 mg total) by mouth daily. 07/04/23  Yes Roslyn Coombe, MD  donepezil  (ARICEPT ) 10 MG tablet Take half tablet (5 mg) daily for 2 weeks, then increase to the full tablet at 10 mg daily 08/08/23  Yes Wertman, Sara E, PA-C  FARXIGA  10 MG TABS tablet Take 1 tablet (10 mg total) by mouth daily before breakfast. 09/27/23  Yes Arcadio Knuckles, MD  SYMBICORT  80-4.5 MCG/ACT inhaler Inhale 2 puffs into the lungs as needed. 07/19/23  Yes [provider]  albuterol  (PROVENTIL  HFA;VENTOLIN  HFA) 108 (90 Base) MCG/ACT inhaler Inhale 2 puffs into the lungs every 6 (six) hours as needed for wheezing or shortness of breath. Patient not taking: Reported on 10/27/2023    [provider]    Allergies  Allergen Reactions   Codeine    Bactrim [Sulfamethoxazole-Trimethoprim] Rash   Flagyl [Metronidazole] Rash   Meloxicam Other (See Comments)    Abdominal pain     Patient Active Problem List   Diagnosis Date Noted   Bradycardia 06/03/2023   Need for influenza vaccination 06/03/2023    Abnormal electrocardiogram (ECG) (EKG) 06/03/2023   Chronic diastolic CHF (congestive heart failure) (HCC) 06/03/2023   Estrogen deficiency 11/23/2022   Stage 3b chronic kidney disease (HCC) 11/23/2022   Encounter for general adult medical examination with abnormal findings 11/22/2022   Major depressive disorder in partial remission (HCC) 05/06/2021   Age-related osteoporosis without current pathological fracture 05/05/2021   Chronic hyperglycemia 03/17/2021   Mild intermittent asthma 09/17/2020   Allergic rhinitis 08/18/2020   Essential hypertension 12/18/2015   GERD (gastroesophageal reflux disease) 12/18/2015   Hyperlipidemia with target LDL less than 100 12/18/2015   OA (osteoarthritis) 12/18/2015    Past Medical History:  Diagnosis Date   Allergic rhinitis    Anxiety    Arthritis    Asthma    Cataract    Depression    Hyperlipemia    Hypertension    Sinusitis    Vitamin D  deficiency     Past Surgical History:  Procedure Laterality Date   CARDIAC CATHETERIZATION N/A 12/19/2015   Procedure: Left Heart Cath and Coronary Angiography;  Surgeon: Arleen Lacer, MD;  Location: [facility] INVASIVE CV LAB;  Service: Cardiovascular;  Laterality: N/A;   CATARACT EXTRACTION, BILATERAL Right 06/2015   CATARACT EXTRACTION, BILATERAL Left 07/2015   TRIGGER FINGER RELEASE     WISDOM TOOTH EXTRACTION      Social History   Socioeconomic History   Marital status: Widowed  Spouse name: Not on file   Number of children: 3   Years of education: 54   Highest education level: 12th grade  Occupational History   Not on file  Tobacco Use   Smoking status: Never    Passive exposure: Never   Smokeless tobacco: Never  Vaping Use   Vaping status: Never Used  Substance and Sexual Activity   Alcohol use: No   Drug use: No   Sexual activity: Not Currently  Other Topics Concern   Not on file  Social History Narrative   Widowed, spouse Rich Champ passed away 2014/11/12 (he was a former patient of  Dr Villa Greaser and Roena Clark NP)   5 children, all live in [place]   OCCUPATION: retired, 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   Right handed   One floor home   3 sons   Lives alone   Retired   Drinks caffeine    Social Drivers of Health   Financial Resource Strain: Low Risk  (10/27/2023)   Overall Financial Resource Strain (CARDIA)    Difficulty of Paying Living Expenses: Not very hard  Food Insecurity: No Food Insecurity (10/27/2023)   Hunger Vital Sign    Worried About Running Out of Food in the Last Year: Never true    Ran Out of Food in the Last Year: Never true  Transportation Needs: No Transportation Needs (10/27/2023)   PRAPARE - Administrator, Civil Service (Medical): No    Lack of Transportation (Non-Medical): No  Physical Activity: Inactive (10/27/2023)   Exercise Vital Sign    Days of Exercise per Week: 0 days    Minutes of Exercise per Session: 0 min  Stress: Stress Concern Present (10/27/2023)   [employer] of Occupational Health - Occupational Stress Questionnaire    Feeling of Stress : To some extent  Social Connections: Moderately Isolated (10/27/2023)   Social Connection and Isolation Panel [NHANES]    Frequency of Communication with Friends and Family: More than three times a week    Frequency of Social Gatherings with Friends and Family: More than three times a week    Attends Religious Services: More than 4 times per year    Active Member of [employer] or Organizations: No    Attends Banker Meetings: Never    Marital Status: Widowed  Intimate Partner Violence: Not At Risk (10/27/2023)   Humiliation, Afraid, Rape, and Kick questionnaire    Fear of Current or Ex-Partner: No    Emotionally Abused: No    Physically Abused: No    Sexually Abused: No    Family History  Problem Relation Age of Onset   Coronary artery disease Mother 32       MI in early 2s   Sick sinus syndrome Mother        required PPM   Depression Mother    Heart attack Mother     Hyperlipidemia Mother    Hypertension Mother    Stroke Mother    Emphysema Father    Asthma Father    Heart attack Father    COPD Father    Hypertension Father    Hyperlipidemia Father    COPD Brother    Hyperlipidemia Brother    Hypertension Brother    Hearing loss Daughter    Breast cancer Daughter    Mitral valve prolapse Son      Review of Systems  Constitutional: Negative.  Negative for chills and fever.  HENT: Negative.  Negative for congestion  and sore throat.   Respiratory: Negative.  Negative for cough and shortness of breath.   Cardiovascular: Negative.  Negative for chest pain and palpitations.  Gastrointestinal:  Negative for abdominal pain, diarrhea, nausea and vomiting.  Genitourinary: Negative.  Negative for dysuria and hematuria.  Musculoskeletal:  Positive for back pain.  Skin: Negative.   Neurological: Negative.  Negative for dizziness and headaches.  All other systems reviewed and are negative.   Vitals:   10/27/23 1520  BP: 120/68  Pulse: (!) 56  Temp: 98.4 F (36.9 C)  SpO2: 98%    Physical Exam Vitals reviewed.  Constitutional:      Appearance: Normal appearance.  HENT:     Head: Normocephalic.  Eyes:     Extraocular Movements: Extraocular movements intact.  Cardiovascular:     Rate and Rhythm: Normal rate.  Pulmonary:     Effort: Pulmonary effort is normal.  Musculoskeletal:     Comments: Positive tenderness left lumbar area                Skin:    General: Skin is warm and dry.  Neurological:     Mental Status: She is alert and oriented to person, place, and time.  Psychiatric:        Mood and Affect: Mood normal.        Behavior: Behavior normal.      ASSESSMENT & PLAN: A total of 34 minutes was spent with the patient and counseling/coordination of care regarding preparing for this visit, review of most recent office visit notes, review of multiple chronic medical conditions and their management, review of all medications,  diagnosis of back contusion and pain management, review of x-ray images done today, review of most recent bloodwork results, prognosis, documentation, and need for follow up.   Problem List Items Addressed This Visit       Other   Contusion of lower back - Primary   Clinically stable.  No red flag signs or symptoms Recommend x-rays today.  Will review images when available Pain management discussed Recommend Tylenol  for mild to moderate pain and tramadol for moderate to severe pain      Relevant Orders   DG Lumbar Spine 2-3 Views   DG HIPS BILAT WITH PELVIS MIN 5 VIEWS   Acute left-sided low back pain without sciatica   Pain management discussed. Recommend Tylenol  for mild to moderate pain and tramadol for moderate to severe pain      Relevant Medications   traMADol (ULTRAM) 50 MG tablet   Patient Instructions  Contusion A contusion is a deep bruise. This is a result of an injury that causes bleeding under the skin. Symptoms of bruising include pain, swelling, and discolored skin. The skin may turn blue, purple, or yellow. Follow these instructions at home: Managing pain, stiffness, and swelling You may use RICE. This stands for: Resting. Icing. Compression, or putting pressure on the injured area. Elevating, or raising the injured area. To follow this method, do these actions: Rest the injured area. If told, put ice on the injured area. To do this: Put ice in a plastic bag. Place a towel between your skin and the bag. Leave the ice on for 20 minutes, 2-3 times per day. If your skin turns bright red, take off the ice right away to prevent skin damage. The risk of skin damage is higher if you cannot feel pain, heat, or cold. If told, apply compression on the injured area using an elastic bandage.  Make sure the bandage is not too tight. If the area tingles or has a loss of feeling (numbness), remove it and put it back on as told by your doctor. If possible, elevate the  injured area above the level of your heart while you are sitting or lying down.  General instructions Take over-the-counter and prescription medicines only as told by your doctor. Keep all follow-up visits. Your doctor may want to see how your contusion is healing with treatment. Contact a doctor if: Your symptoms do not get better after several days of treatment. Your symptoms get worse. You have trouble moving the injured area. Get help right away if: You have very bad pain. You have a loss of feeling (numbness) in a hand or foot. Your hand or foot turns pale or cold. This information is not intended to replace advice given to you by your health care provider. Make sure you discuss any questions you have with your health care provider. Document Revised: 11/09/2021 Document Reviewed: 11/09/2021 Elsevier Patient Education  2024 Elsevier Inc.    Maryagnes Small, MD [facility]

## 2023-10-27 NOTE — Assessment & Plan Note (Signed)
 Clinically stable.  No red flag signs or symptoms Recommend x-rays today.  Will review images when available Pain management discussed Recommend Tylenol  for mild to moderate pain and tramadol for moderate to severe pain

## 2023-10-27 NOTE — Patient Instructions (Signed)
 Brenda Bautista , Thank you for taking time out of your busy schedule to complete your Annual Wellness Visit with me. I enjoyed our conversation and look forward to speaking with you again next year. I, as well as your care team,  appreciate your ongoing commitment to your health goals. Please review the following plan we discussed and let me know if I can assist you in the future. Your Game plan/ To Do List   Follow up Visits: Next Medicare AWV with our clinical staff: 11/01/2024   Have you seen your provider in the last 6 months (3 months if uncontrolled diabetes)? Yes Next Office Visit with your provider: 03/27/2024  Clinician Recommendations:  Aim for 30 minutes of exercise or brisk walking, 6-8 glasses of water, and 5 servings of fruits and vegetables each day. Educated and advised on getting the Tdap, COVID, and Shingles vaccines in 2025.      This is a list of the screening recommended for you and due dates:  Health Maintenance  Topic Date Due   Zoster (Shingles) Vaccine (1 of 2) 09/26/1993   DTaP/Tdap/Td vaccine (3 - Tdap) 06/24/2020   COVID-19 Vaccine (4 - 2024-25 season) 02/06/2023   Flu Shot  01/06/2024   Medicare Annual Wellness Visit  10/26/2024   Pneumonia Vaccine  Completed   DEXA scan (bone density measurement)  Completed   HPV Vaccine  Aged Out   Meningitis B Vaccine  Aged Out   Colon Cancer Screening  Discontinued   Hepatitis C Screening  Discontinued    Advanced directives: (Copy Requested) Please bring a copy of your health care power of attorney and living will to the office to be added to your chart at your convenience. You can mail to [facility] [address] [place], [place] [address] or email to [email or web address] Advance Care Planning is important because it:  [x]  Makes sure you receive the medical care that is consistent with your values, goals, and preferences  [x]  It provides guidance to your family and loved ones and reduces their  decisional burden about whether or not they are making the right decisions based on your wishes.  Follow the link provided in your after visit summary or read over the paperwork we have mailed to you to help you started getting your Advance Directives in place. If you need assistance in completing these, please reach out to us  so that we can help you!

## 2023-10-28 ENCOUNTER — Ambulatory Visit: Payer: Self-pay | Admitting: Emergency Medicine

## 2023-11-09 ENCOUNTER — Encounter: Payer: Self-pay | Admitting: Physician Assistant

## 2023-11-09 ENCOUNTER — Ambulatory Visit (INDEPENDENT_AMBULATORY_CARE_PROVIDER_SITE_OTHER): Admitting: Physician Assistant

## 2023-11-09 VITALS — BP 147/70 | HR 60 | Resp 20 | Ht 59.0 in | Wt 106.0 lb

## 2023-11-09 DIAGNOSIS — R413 Other amnesia: Secondary | ICD-10-CM

## 2023-11-09 MED ORDER — MIRTAZAPINE 7.5 MG PO TABS: 7.5000 mg | ORAL_TABLET | Freq: Every day | ORAL | 11 refills | Status: DC

## 2023-11-09 MED ORDER — MEMANTINE HCL 5 MG PO TABS: ORAL_TABLET | ORAL | 11 refills | Status: DC

## 2023-11-09 NOTE — Patient Instructions (Signed)
 It was a pleasure to see you today at our office.   Recommendations:  Discontinue donepezil   Follow up in 6 months  Start Remeron 7. 5 mg at night  Start memantine 5 mg for 2 weeks, then increase to 5 mg twice a day, but not now, around June 18   Recommend visiting the website : " Dementia Success Path" to better understand some behaviors related to memory loss.    For psychiatric meds, mood meds: Please have your primary care physician manage these medications.  If you have any severe symptoms of a stroke, or other severe issues such as confusion,severe chills or fever, etc call 911 or go to the ER as you may need to be evaluated further   For guidance regarding [facility] and if placement were needed at the facility, contact Social Worker tel: [phone]  For assessment of decision of mental capacity and competency:  Call Dr. Laverne Potter, geriatric psychiatrist at [phone]  Counseling regarding caregiver distress, including caregiver depression, anxiety and issues regarding community resources, adult day care programs, adult living facilities, or memory care questions:  please contact  or Social Worker   Whom to call: Memory  decline, memory medications: Call our office [phone]    [email or web address]   RECOMMENDATIONS FOR ALL PATIENTS WITH MEMORY PROBLEMS: 1. Continue to exercise (Recommend 30 minutes of walking everyday, or 3 hours every week) 2. Increase social interactions - continue going to [place] and enjoy social gatherings with friends and family 3. Eat healthy, avoid fried foods and eat more fruits and vegetables 4. Maintain adequate blood pressure, blood sugar, and blood cholesterol level. Reducing the risk of stroke and cardiovascular disease also helps promoting better memory. 5. Avoid stressful situations. Live a simple life and avoid aggravations. Organize your time and prepare for  the next day in anticipation. 6. Sleep well, avoid any interruptions of sleep and avoid any distractions in the bedroom that may interfere with adequate sleep quality 7. Avoid sugar, avoid sweets as there is a strong link between excessive sugar intake, diabetes, and cognitive impairment We discussed the Mediterranean diet, which has been shown to help patients reduce the risk of progressive memory disorders and reduces cardiovascular risk. This includes eating fish, eat fruits and green leafy vegetables, nuts like almonds and hazelnuts, walnuts, and also use olive oil. Avoid fast foods and fried foods as much as possible. Avoid sweets and sugar as sugar use has been linked to worsening of memory function.  There is always a concern of gradual progression of memory problems. If this is the case, then we may need to adjust level of care according to patient needs. Support, both to the patient and caregiver, should then be put into place.            FALL PRECAUTIONS: Be cautious when walking. Scan the area for obstacles that may increase the risk of trips and falls. When getting up in the mornings, sit up at the edge of the bed for a few minutes before getting out of bed. Consider elevating the bed at the head end to avoid drop of blood pressure when getting up. Walk always in a well-lit room (use night lights in the walls). Avoid area rugs or power cords from appliances in the middle of the walkways. Use a walker or a cane if necessary and consider physical therapy for balance exercise. Get your eyesight checked regularly.  FINANCIAL OVERSIGHT: Supervision, especially oversight when making financial decisions or transactions  is also recommended.  HOME SAFETY: Consider the safety of the kitchen when operating appliances like stoves, microwave oven, and blender. Consider having supervision and share cooking responsibilities until no longer able to participate in those. Accidents with firearms and  other hazards in the house should be identified and addressed as well.   ABILITY TO BE LEFT ALONE: If patient is unable to contact 911 operator, consider using LifeLine, or when the need is there, arrange for someone to stay with patients. Smoking is a fire hazard, consider supervision or cessation. Risk of wandering should be assessed by caregiver and if detected at any point, supervision and safe proof recommendations should be instituted.  MEDICATION SUPERVISION: Inability to self-administer medication needs to be constantly addressed. Implement a mechanism to ensure safe administration of the medications.      Mediterranean Diet A Mediterranean diet refers to food and lifestyle choices that are based on the traditions of countries located on the [employer]. This way of eating has been shown to help prevent certain conditions and improve outcomes for people who have chronic diseases, like kidney disease and heart disease. What are tips for following this plan? Lifestyle  Cook and eat meals together with your family, when possible. Drink enough fluid to keep your urine clear or pale yellow. Be physically active every day. This includes: Aerobic exercise like running or swimming. Leisure activities like gardening, walking, or housework. Get 7-8 hours of sleep each night. If recommended by your health care provider, drink red wine in moderation. This means 1 glass a day for nonpregnant women and 2 glasses a day for men. A glass of wine equals 5 oz (150 mL). Reading food labels  Check the serving size of packaged foods. For foods such as rice and pasta, the serving size refers to the amount of cooked product, not dry. Check the total fat in packaged foods. Avoid foods that have saturated fat or trans fats. Check the ingredients list for added sugars, such as corn syrup. Shopping  At the grocery store, buy most of your food from the areas near the walls of the store. This  includes: Fresh fruits and vegetables (produce). Grains, beans, nuts, and seeds. Some of these may be available in unpackaged forms or large amounts (in bulk). Fresh seafood. Poultry and eggs. Low-fat dairy products. Buy whole ingredients instead of prepackaged foods. Buy fresh fruits and vegetables in-season from local farmers markets. Buy frozen fruits and vegetables in resealable bags. If you do not have access to quality fresh seafood, buy precooked frozen shrimp or canned fish, such as tuna, salmon, or sardines. Buy small amounts of raw or cooked vegetables, salads, or olives from the deli or salad bar at your store. Stock your pantry so you always have certain foods on hand, such as olive oil, canned tuna, canned tomatoes, rice, pasta, and beans. Cooking  Cook foods with extra-virgin olive oil instead of using butter or other vegetable oils. Have meat as a side dish, and have vegetables or grains as your main dish. This means having meat in small portions or adding small amounts of meat to foods like pasta or stew. Use beans or vegetables instead of meat in common dishes like chili or lasagna. Experiment with different cooking methods. Try roasting or broiling vegetables instead of steaming or sauteing them. Add frozen vegetables to soups, stews, pasta, or rice. Add nuts or seeds for added healthy fat at each meal. You can add these to yogurt, salads, or vegetable dishes.  Marinate fish or vegetables using olive oil, lemon juice, garlic, and fresh herbs. Meal planning  Plan to eat 1 vegetarian meal one day each week. Try to work up to 2 vegetarian meals, if possible. Eat seafood 2 or more times a week. Have healthy snacks readily available, such as: Vegetable sticks with hummus. Greek yogurt. Fruit and nut trail mix. Eat balanced meals throughout the week. This includes: Fruit: 2-3 servings a day Vegetables: 4-5 servings a day Low-fat dairy: 2 servings a day Fish, poultry, or  lean meat: 1 serving a day Beans and legumes: 2 or more servings a week Nuts and seeds: 1-2 servings a day Whole grains: 6-8 servings a day Extra-virgin olive oil: 3-4 servings a day Limit red meat and sweets to only a few servings a month What are my food choices? Mediterranean diet Recommended Grains: Whole-grain pasta. Brown rice. Bulgar wheat. Polenta. Couscous. Whole-wheat bread. Dwyane Glad. Vegetables: Artichokes. Beets. Broccoli. Cabbage. Carrots. Eggplant. Green beans. Chard. Kale. Spinach. Onions. Leeks. Peas. Squash. Tomatoes. Peppers. Radishes. Fruits: Apples. Apricots. Avocado. Berries. Bananas. Cherries. Dates. Figs. Grapes. Lemons. Melon. Oranges. Peaches. Plums. Pomegranate. Meats and other protein foods: Beans. Almonds. Sunflower seeds. Pine nuts. Peanuts. Cod. Salmon. Scallops. Shrimp. Tuna. Tilapia. Clams. Oysters. Eggs. Dairy: Low-fat milk. Cheese. Greek yogurt. Beverages: Water. Red wine. Herbal tea. Fats and oils: Extra virgin olive oil. Avocado oil. Grape seed oil. Sweets and desserts: [place] yogurt with honey. Baked apples. Poached pears. Trail mix. Seasoning and other foods: Basil. Cilantro. Coriander. Cumin. Mint. Parsley. Sage. Rosemary. Tarragon. Garlic. Oregano. Thyme. Pepper. Balsalmic vinegar. Tahini. Hummus. Tomato sauce. Olives. Mushrooms. Limit these Grains: Prepackaged pasta or rice dishes. Prepackaged cereal with added sugar. Vegetables: Deep fried potatoes (french fries). Fruits: Fruit canned in syrup. Meats and other protein foods: Beef. Pork. Lamb. Poultry with skin. Hot dogs. Helene Loader. Dairy: Ice cream. Sour cream. Whole milk. Beverages: Juice. Sugar-sweetened soft drinks. Beer. Liquor and spirits. Fats and oils: Butter. Canola oil. Vegetable oil. Beef fat (tallow). Lard. Sweets and desserts: Cookies. Cakes. Pies. Candy. Seasoning and other foods: Mayonnaise. Premade sauces and marinades. The items listed may not be a complete list. Talk with your  dietitian about what dietary choices are right for you. Summary The Mediterranean diet includes both food and lifestyle choices. Eat a variety of fresh fruits and vegetables, beans, nuts, seeds, and whole grains. Limit the amount of red meat and sweets that you eat. Talk with your health care provider about whether it is safe for you to drink red wine in moderation. This means 1 glass a day for nonpregnant women and 2 glasses a day for men. A glass of wine equals 5 oz (150 mL). This information is not intended to replace advice given to you by your health care provider. Make sure you discuss any questions you have with your health care provider. Document Released: 01/15/2016 Document Revised: 02/17/2016 Document Reviewed: 01/15/2016 Elsevier Interactive Patient Education  2017 [employer].

## 2023-11-09 NOTE — Progress Notes (Signed)
 Assessment/Plan:   Dementia likely due to Alzheimer disease   Brenda Bautista is a very pleasant 78 y.o. RH female with a history of hypertension, hyperlipidemia, anxiety, depression, asthma, vitamin D  deficiency, seen today in follow up for memory loss. Patient is currently on donepezil  10 mg daily.  She was noted to have bradycardia per EKG ordered by her PCP which could be  exacerbated to donepezil .  Recommend holding medication.  In place, after a brief period, she is to start memantine 5 mg twice daily as directed, with the goal of increasing it to 10 mg twice daily if tolerated.  In addition, the patient has decreased appetite, poor sleep and situational depression due to memory issues.  We discussed trying mirtazapine 7.5 mg nightly and monitor, hopefully helping her with the symptoms.  She is able to participate on her ADLs without difficulties, mood is good.  She no longer drives.    Follow up in 6  months. Discontinue donepezil , will start memantine 5 mg twice daily, titrating it up to 10 mg twice daily if tolerated.  Side effects discussed.  (Bradycardia) Mirtazapine 7.5 mg nightly for mood, decreased appetite and sleep, side effects discussed Recommend good control of her cardiovascular risk factors Continue to control mood as per PCP     Subjective:    This patient is accompanied in the office by her son  who supplements the history.  Previous records as well as any outside records available were reviewed prior to todays visit. Patient was last seen on 08/08/2023 with [facility] 12/30    Any changes in memory since last visit? "Gotten worse, especially with dates days and nights".  "Forgets that she called and she calls 2-3 times for the same issue ".  As before, she has difficulty with new information, recent conversations and names.  LTM is fair.  She enjoys doing chores around the house, going to [place], visiting her boyfriend, walking her dog and doing crossword puzzles.   repeats oneself?  Endorsed Disoriented when walking into a room? Denies    Leaving objects?  May misplace things but not in unusual places.  She may be less organized than before.   Wandering behavior?  denies   Any personality changes since last visit?  Denies.  She has a history of depression after the death of her husband, "always talks to herself "-son says. She has been throwing stuff at her son, being more emotional. Any worsening depression?:  Denies.   Hallucinations or paranoia?  She continues to report that "people come into the house and takes stuff for me".  Occasionally she feels the presence of someone, or senses people on the porch. Seizures? denies    Any sleep changes?  Sleeps well.  Sometimes reports that she may wake up several times during the night.  Denies vivid dreams, REM behavior or sleepwalking   Sleep apnea?   Denies.   Any hygiene concerns? Denies.  Independent of bathing and dressing?  Endorsed  Does the patient needs help with medications?   Son is in charge   Who is in charge of the finances? Son  is in charge     Any changes in appetite?  "If she is alone she will not eat , she only eats when her son is around.  Does not drink enough water, instead she favors [employer].    Patient have trouble swallowing? Denies.  She has a history of GERD Does the patient cook?  Yes, over  the last year may have forgotten some common recipes. Any headaches?   denies   Any vision changes? Denies  Chronic pain?  Endorsed, she has osteoporosis, followed by PCP. Ambulates with difficulty? Denies.    Recent falls or head injuries?  On 10/26/2023 lost her balance and fell backwards injuring the left lower back, no head or neck injury, no loss of consciousness.  It was felt to be a contusion she has some back pain that was treated, she is feeling better now. Unilateral weakness, numbness or tingling? denies   Any tremors?  Denies   Any anosmia?  Denies   Any incontinence of  urine?  Endorsed  Any bowel dysfunction?   Denies      Patient lives alone with son's supervision.  Her son has placed cameras at the doors for safety.    Does the patient drive?  She never drove   Initial visit August 08, 2023 How long did patient have memory difficulties?  Son reports that her memory issues have been present for about 2 years, worse over the last 3 months.  Patient reports that she shows more difficulty remembering new information, recent conversations, names. LTM is "ok in some aspects". Enjoys doing chores around the house, going to [employer], Dance movement psychotherapist and crossword puzzles.  repeats oneself?  Endorsed, especially with appointments. Disoriented when walking into a room?  Patient denies    Leaving objects in unusual places?  She may lose her books, she is less organized than before.    Wandering behavior? denies   Any personality changes, or depression, anxiety? She has a history of depression after the death of her husband. Never had psychotherapist. She has always talked to herself.  Hallucinations or paranoia? "She reports that people come to the house take stuff". Occasionally she has felt the presence of someone Seizures? denies    Any sleep changes?  Sleeps well. Son denies, he reports that at times she tells her son that she may wake up several times a night.  Denies vivid dreams, REM behavior or sleepwalking   Sleep apnea? Denies.   Any hygiene concerns?  Denies.   Independent of bathing and dressing? Endorsed  Does the patient need help with medications? Son  is in charge   Who is in charge of the finances? Son is in charge    Any changes in appetite?   Denies, but son states that her appetite is decreased. Does not drink enough water.    Patient have trouble swallowing?  GERD.  Does the patient cook? Yes, she may have forgotten common recipes for the last 6 months. Denies any kitchen accidents Any headaches?  Denies.   Chronic pain? She has osteoporosis, followed by  PCP Ambulates with difficulty? Denies  Recent falls or head injuries? Denies.     Vision changes?  Denies any new issues.  Any strokelike symptoms? Denies.   Any tremors? Denies.  Any anosmia? Denies.   Any incontinence of urine? Denies.   Any bowel dysfunction? Denies.      Patient lives alone with son's supervision.  History of heavy alcohol intake? Denies.   History of heavy tobacco use? Denies.   Family history of dementia?  Denies  Does patient drive? Never drove  MRI of the brain February 2025, personally reviewed remarkable for mild generalized parenchymal volume loss, mild hippocampal atrophy,  mild chronic microvascular changes, no acute findings.   PREVIOUS MEDICATIONS:   CURRENT MEDICATIONS:  Outpatient Encounter Medications as of 11/09/2023  Medication Sig   [START ON 11/23/2023] memantine (NAMENDA) 5 MG tablet Take one tab at night for 2 weeks, increase to 1 tab twice a week   mirtazapine (REMERON) 7.5 MG tablet Take 1 tablet (7.5 mg total) by mouth at bedtime.   albuterol  (PROVENTIL  HFA;VENTOLIN  HFA) 108 (90 Base) MCG/ACT inhaler Inhale 2 puffs into the lungs every 6 (six) hours as needed for wheezing or shortness of breath. (Patient not taking: Reported on 10/27/2023)   amLODipine  (NORVASC ) 5 MG tablet Take 1 tablet (5 mg total) by mouth daily.   atorvastatin  (LIPITOR ) 40 MG tablet Take 1 tablet (40 mg total) by mouth daily.   FARXIGA  10 MG TABS tablet Take 1 tablet (10 mg total) by mouth daily before breakfast.   SYMBICORT  80-4.5 MCG/ACT inhaler Inhale 2 puffs into the lungs as needed.   [DISCONTINUED] donepezil  (ARICEPT ) 10 MG tablet Take half tablet (5 mg) daily for 2 weeks, then increase to the full tablet at 10 mg daily   No facility-administered encounter medications on file as of 11/09/2023.       10/27/2023    1:15 PM  MMSE - Mini Mental State Exam  Not completed: Unable to complete      08/08/2023    8:00 AM  Montreal Cognitive Assessment   Visuospatial/  Executive (0/5) 1  Naming (0/3) 2  Attention: Read list of digits (0/2) 2  Attention: Read list of letters (0/1) 1  Attention: Serial 7 subtraction starting at 100 (0/3) 0  Language: Repeat phrase (0/2) 1  Language : Fluency (0/1) 0  Abstraction (0/2) 1  Delayed Recall (0/5) 0  Orientation (0/6) 3  Total 11  Adjusted Score (based on education) 12    Objective:     PHYSICAL EXAMINATION:    VITALS:   Vitals:   11/09/23 1503  BP: (!) 147/70  Pulse: 60  Resp: 20  SpO2: 97%  Weight: 106 lb (48.1 kg)  Height: 4\' 11"  (1.499 m)    GEN:  The patient appears stated age and is in NAD. HEENT:  Normocephalic, atraumatic.   Neurological examination:  General: NAD, well-groomed, appears stated age. Orientation: The patient is alert. Oriented to person, not to place or date. Cranial nerves: There is good facial symmetry.The speech is fluent and clear. No aphasia or dysarthria. Fund of knowledge is appropriate. Recent and remote memory are impaired. Attention and concentration are reduced. Able to name objects and repeat phrases.  Hearing is intact to conversational tone.   Sensation: Sensation is intact to light touch throughout Motor: Strength is at least antigravity x4. DTR's 2/4 in UE/LE     Movement examination: Tone: There is normal tone in the UE/LE Abnormal movements:  no tremor.  No myoclonus.  No asterixis.   Coordination:  There is no decremation with RAM's. Normal finger to nose  Gait and Station: The patient has no difficulty arising out of a deep-seated chair without the use of the hands. The patient's stride length is good.  Gait is cautious and narrow.    Thank you for allowing us  the opportunity to participate in the care of this nice patient. Please do not hesitate to contact us  for any questions or concerns.   Total time spent on today's visit was 25 minutes dedicated to this patient today, preparing to see patient, examining the patient, ordering tests and/or  medications and counseling the patient, documenting clinical information in the EHR or other health record, independently interpreting results and communicating results to  the patient/family, discussing treatment and goals, answering patient's questions and coordinating care.  Cc:  Arcadio Knuckles, MD  Tex Filbert 11/09/2023 4:26 PM

## 2023-11-10 ENCOUNTER — Other Ambulatory Visit: Payer: Self-pay | Admitting: Pulmonary Disease

## 2023-11-21 ENCOUNTER — Ambulatory Visit: Admitting: Physician Assistant

## 2023-11-24 ENCOUNTER — Ambulatory Visit: Payer: Self-pay | Admitting: Internal Medicine

## 2023-11-24 NOTE — Telephone Encounter (Signed)
 FYI Only or Action Required?: FYI only for provider.  Brenda was last seen in primary care on 10/27/2023 by Elvira Hammersmith, MD. Called Nurse Triage reporting Dizziness. Symptoms began several days ago.  Symptoms are: gradually worsening.  Triage Disposition: See Physician Within 4 Hours (or PCP Triage) (overriding See Physician Within 24 Hours)  Brenda/caregiver understands and will follow disposition?: Yes                     Copied from CRM [id number]. Topic: Clinical - Red Word Triage >> Nov 24, 2023  8:45 AM Brenda Bautista wrote: Red Word that prompted transfer to Nurse Triage: Brenda Bautista,Brenda Bautista called in stated Brenda has a cold, but is now dizzy, and lightheaded Reason for Disposition  [1] MODERATE dizziness (e.g., interferes with normal activities) AND [2] has NOT been evaluated by doctor (or NP/PA) for this  (Exception: Dizziness caused by heat exposure, sudden standing, or poor fluid intake.)  Answer Assessment - Initial Assessment Questions Spoke with Brenda's Bautista, Brenda Bautista. This RN advised Brenda Bautista to take Brenda to an urgent care today based off symptoms. No appointment availability in office. Brenda Bautista agreeable.   Brenda started with a loose cough and was hoarse on Saturday Brenda no longer hoarse Now having dizziness and still has a productive cough Dizziness and lightheaded intermittent Feels like heart rate is fast Yesterday vitals: BP 129/69, O2 saturation 98%, HR 60  Protocols used: Dizziness - Lightheadedness-A-AH

## 2023-12-04 ENCOUNTER — Other Ambulatory Visit: Payer: Self-pay | Admitting: Physician Assistant

## 2023-12-05 ENCOUNTER — Other Ambulatory Visit: Payer: Self-pay | Admitting: Internal Medicine

## 2023-12-05 DIAGNOSIS — N1832 Chronic kidney disease, stage 3b: Secondary | ICD-10-CM

## 2024-03-27 ENCOUNTER — Ambulatory Visit: Payer: Self-pay | Admitting: Internal Medicine

## 2024-03-27 ENCOUNTER — Telehealth: Payer: Self-pay

## 2024-03-27 ENCOUNTER — Encounter: Payer: Self-pay | Admitting: Internal Medicine

## 2024-03-27 ENCOUNTER — Ambulatory Visit: Admitting: Internal Medicine

## 2024-03-27 VITALS — BP 142/78 | HR 62 | Temp 98.7°F | Resp 16 | Ht 59.0 in | Wt 106.6 lb

## 2024-03-27 DIAGNOSIS — R0609 Other forms of dyspnea: Secondary | ICD-10-CM | POA: Insufficient documentation

## 2024-03-27 DIAGNOSIS — I10 Essential (primary) hypertension: Secondary | ICD-10-CM

## 2024-03-27 DIAGNOSIS — I5032 Chronic diastolic (congestive) heart failure: Secondary | ICD-10-CM

## 2024-03-27 DIAGNOSIS — E785 Hyperlipidemia, unspecified: Secondary | ICD-10-CM

## 2024-03-27 DIAGNOSIS — Z23 Encounter for immunization: Secondary | ICD-10-CM | POA: Diagnosis not present

## 2024-03-27 DIAGNOSIS — F01C0 Vascular dementia, severe, without behavioral disturbance, psychotic disturbance, mood disturbance, and anxiety: Secondary | ICD-10-CM

## 2024-03-27 LAB — HEPATIC FUNCTION PANEL
ALT: 29 U/L (ref 0–35)
AST: 39 U/L — ABNORMAL HIGH (ref 0–37)
Albumin: 4.5 g/dL (ref 3.5–5.2)
Alkaline Phosphatase: 110 U/L (ref 39–117)
Bilirubin, Direct: 0.3 mg/dL (ref 0.0–0.3)
Total Bilirubin: 1.5 mg/dL — ABNORMAL HIGH (ref 0.2–1.2)
Total Protein: 7.1 g/dL (ref 6.0–8.3)

## 2024-03-27 LAB — CBC WITH DIFFERENTIAL/PLATELET
Basophils Absolute: 0.1 K/uL (ref 0.0–0.1)
Basophils Relative: 0.7 % (ref 0.0–3.0)
Eosinophils Absolute: 0.1 K/uL (ref 0.0–0.7)
Eosinophils Relative: 0.6 % (ref 0.0–5.0)
HCT: 43.7 % (ref 36.0–46.0)
Hemoglobin: 14.6 g/dL (ref 12.0–15.0)
Lymphocytes Relative: 20.2 % (ref 12.0–46.0)
Lymphs Abs: 2.5 K/uL (ref 0.7–4.0)
MCHC: 33.4 g/dL (ref 30.0–36.0)
MCV: 94 fl (ref 78.0–100.0)
Monocytes Absolute: 1 K/uL (ref 0.1–1.0)
Monocytes Relative: 8.2 % (ref 3.0–12.0)
Neutro Abs: 8.7 K/uL — ABNORMAL HIGH (ref 1.4–7.7)
Neutrophils Relative %: 70.3 % (ref 43.0–77.0)
Platelets: 321 K/uL (ref 150.0–400.0)
RBC: 4.64 Mil/uL (ref 3.87–5.11)
RDW: 13.4 % (ref 11.5–15.5)
WBC: 12.4 K/uL — ABNORMAL HIGH (ref 4.0–10.5)

## 2024-03-27 LAB — BASIC METABOLIC PANEL WITH GFR
BUN: 21 mg/dL (ref 6–23)
CO2: 30 meq/L (ref 19–32)
Calcium: 10.8 mg/dL — ABNORMAL HIGH (ref 8.4–10.5)
Chloride: 100 meq/L (ref 96–112)
Creatinine, Ser: 1.15 mg/dL (ref 0.40–1.20)
GFR: 45 mL/min — ABNORMAL LOW (ref >60.00)
Glucose, Bld: 88 mg/dL (ref 70–99)
Potassium: 3.9 meq/L (ref 3.5–5.1)
Sodium: 142 meq/L (ref 135–145)

## 2024-03-27 LAB — TROPONIN I (HIGH SENSITIVITY): High Sens Troponin I: 24 ng/L (ref 2–17)

## 2024-03-27 LAB — BRAIN NATRIURETIC PEPTIDE: Pro B Natriuretic peptide (BNP): 36 pg/mL (ref 0.0–100.0)

## 2024-03-27 LAB — TSH: TSH: 4.66 u[IU]/mL (ref 0.35–5.50)

## 2024-03-27 MED ORDER — COVID-19 MRNA VAC-TRIS(PFIZER) 30 MCG/0.3ML IM SUSY: 0.3000 mL | PREFILLED_SYRINGE | Freq: Once | INTRAMUSCULAR | 0 refills | Status: AC

## 2024-03-27 NOTE — Telephone Encounter (Signed)
 CRITICAL VALUE STICKER  CRITICAL VALUE: Troponin 24   RECEIVER (on-site recipient of call): Jazz  DATE & TIME NOTIFIED: 03/27/2024 10:26am   MESSENGER (representative from lab): Saa   MD NOTIFIED: Yes

## 2024-03-27 NOTE — Progress Notes (Unsigned)
 Subjective:  Patient ID: Brenda Bautista, female    DOB: [birth year]  Age: 78 y.o. MRN: [id number]  CC: Medical Management of Chronic Issues (6 month follow up. Patient wants to know why she's always dizzy and lightheaded. This has been going on for a while now)   HPI Brenda Bautista presents for f/up ----  Discussed the use of AI scribe software for clinical note transcription with the patient, who gave verbal consent to proceed.  History of Present Illness Brenda Bautista is an 78 year old female with early-stage dementia who presents with worsening dizziness and lightheadedness.  She experiences persistent dizziness and lightheadedness, which have increased in frequency. She feels unsteady on her feet but has not experienced any episodes of syncope. No associated nausea, vomiting, numbness, weakness, or tingling.  An MRI of the brain conducted earlier this year revealed early-stage dementia. Her short-term memory is significantly affected, while her long-term memory remains intact. She is able to perform personal care activities such as bathing and feeding herself but does not maintain a home independently and resides with her caregiver.  She experiences shortness of breath primarily during physical activities. No associated chest pain, cough, or stomach pain.  Her blood pressure is usually slightly elevated, and her blood sugar is around 120. She has a history of a heart murmur.   Outpatient Medications Prior to Visit  Medication Sig Dispense Refill   albuterol  (PROVENTIL  HFA;VENTOLIN  HFA) 108 (90 Base) MCG/ACT inhaler Inhale 2 puffs into the lungs every 6 (six) hours as needed for wheezing or shortness of breath.     amLODipine  (NORVASC ) 5 MG tablet Take 1 tablet (5 mg total) by mouth daily. 30 tablet 0   atorvastatin  (LIPITOR ) 40 MG tablet Take 1 tablet (40 mg total) by mouth daily. 90 tablet 3   FARXIGA  10 MG TABS tablet TAKE 1 TABLET BY MOUTH DAILY  BEFORE  BREAKFAST 100 tablet 2   memantine  (NAMENDA ) 5 MG tablet TAKE ONE TAB AT NIGHT FOR 2 WEEKS, INCREASE TO 1 TAB TWICE A DAY 180 tablet 4   mirtazapine  (REMERON ) 7.5 MG tablet TAKE 1 TABLET BY MOUTH AT BEDTIME. 90 tablet 4   SYMBICORT  80-4.5 MCG/ACT inhaler INHALE 2 PUFFS INTO THE LUNGS IN THE MORNING AND AT BEDTIME. 30.6 each 3   No facility-administered medications prior to visit.    ROS Review of Systems  Constitutional:  Positive for fatigue. Negative for appetite change, chills, diaphoresis and unexpected weight change.  HENT: Negative.    Eyes:  Negative for visual disturbance.  Respiratory:  Positive for shortness of breath. Negative for cough, chest tightness and wheezing.   Cardiovascular:  Negative for chest pain, palpitations and leg swelling.  Gastrointestinal:  Negative for abdominal pain, constipation, diarrhea, nausea and vomiting.  Genitourinary: Negative.  Negative for difficulty urinating.  Musculoskeletal: Negative.  Negative for gait problem.  Skin: Negative.   Neurological: Negative.  Negative for dizziness, weakness, light-headedness and headaches.  Hematological:  Negative for adenopathy. Does not bruise/bleed easily.  Psychiatric/Behavioral:  Positive for confusion and decreased concentration.     Objective:  BP (!) 142/78 (BP Location: Left Arm, Patient Position: Sitting, Cuff Size: Normal)   Pulse 62   Temp 98.7 F (37.1 C) (Oral)   Resp 16   Ht 4' 11 (1.499 m)   Wt 106 lb 9.6 oz (48.4 kg)   SpO2 95%   BMI 21.53 kg/m   BP Readings from Last 3 Encounters:  03/27/24 (!) 142/78  11/09/23 (!) 147/70  10/27/23 120/68    Wt Readings from Last 3 Encounters:  03/27/24 106 lb 9.6 oz (48.4 kg)  11/09/23 106 lb (48.1 kg)  10/27/23 108 lb (49 kg)    Physical Exam Vitals reviewed.  Constitutional:      Appearance: Normal appearance.  HENT:     Nose: Nose normal.     Mouth/Throat:     Mouth: Mucous membranes are moist.  Eyes:     General: No scleral  icterus.    Conjunctiva/sclera: Conjunctivae normal.  Cardiovascular:     Rate and Rhythm: Bradycardia present.     Heart sounds: S1 normal and S2 normal. Murmur heard.     Systolic murmur is present with a grade of 1/6.     No friction rub. No gallop.     Comments: 1/6 SEM RUSB  EKG -- SB, 59 bpm NS ST/T wave changes No Q waves Unchanged    Pulmonary:     Effort: Pulmonary effort is normal.     Breath sounds: No stridor. No wheezing, rhonchi or rales.  Abdominal:     General: Abdomen is flat.     Palpations: There is no mass.     Tenderness: There is no abdominal tenderness. There is no guarding.     Hernia: No hernia is present.  Musculoskeletal:     Cervical back: Neck supple.     Right lower leg: No edema.     Left lower leg: No edema.  Lymphadenopathy:     Cervical: No cervical adenopathy.  Skin:    General: Skin is warm and dry.  Neurological:     General: No focal deficit present.     Mental Status: She is alert.  Psychiatric:        Mood and Affect: Mood normal.        Behavior: Behavior normal.     Lab Results  Component Value Date   WBC 12.4 (H) 03/27/2024   HGB 14.6 03/27/2024   HCT 43.7 03/27/2024   PLT 321.0 03/27/2024   GLUCOSE 88 03/27/2024   CHOL 170 09/26/2023   TRIG 148.0 09/26/2023   HDL 73.60 09/26/2023   LDLCALC 67 09/26/2023   ALT 29 03/27/2024   AST 39 (H) 03/27/2024   NA 142 03/27/2024   K 3.9 03/27/2024   CL 100 03/27/2024   CREATININE 1.15 03/27/2024   BUN 21 03/27/2024   CO2 30 03/27/2024   TSH 4.66 03/27/2024   INR 1.15 12/18/2015   HGBA1C 5.8 11/22/2022    MR Brain Wo Contrast Result Date: 08/04/2023 CLINICAL DATA:  Memory loss EXAM: MRI HEAD WITHOUT CONTRAST TECHNIQUE: Multiplanar, multiecho pulse sequences of the brain and surrounding structures were obtained without intravenous contrast. COMPARISON:  None Available. FINDINGS: Brain: Vascular: Skull base flow voids are visualized. Skull and upper cervical spine: No  focal abnormality. Sinuses/Orbits: Orbits are symmetric. Mild mucosal thickening noted in the maxillary sinuses. Other: Mastoid air cells are clear. IMPRESSION: *No acute intracranial abnormality. *Mild generalized parenchymal volume loss. No disproportionate atrophy of the hippocampi or medial temporal lobes. *Nonspecific scattered foci of FLAIR hyperintensity in the subcortical and periventricular white matter. Electronically Signed   By: Franky Chard M.D.   On: 08/04/2023 09:49    Assessment & Plan:  Need for immunization against influenza -     Flu vaccine HIGH DOSE PF(Fluzone Trivalent)  Essential hypertension- Her BP is adequately well controlled. -     COVID-19 mRNA Vac-TriS(Pfizer); Inject 0.3 mLs into  the muscle once for 1 dose.  Dispense: 0.3 mL; Refill: 0 -     EKG 12-Lead -     Basic metabolic panel with GFR; Future -     CBC with Differential/Platelet; Future -     TSH; Future -     Hepatic function panel; Future  Chronic diastolic CHF (congestive heart failure) (HCC)- She has no signs of fluid excess. -     COVID-19 mRNA Vac-TriS(Pfizer); Inject 0.3 mLs into the muscle once for 1 dose.  Dispense: 0.3 mL; Refill: 0 -     Troponin I (High Sensitivity); Future -     Brain natriuretic peptide; Future  DOE (dyspnea on exertion)- EKG and labs are reassuring. -     Troponin I (High Sensitivity); Future -     Brain natriuretic peptide; Future -     EKG 12-Lead  Hyperlipidemia with target LDL less than 100 -     Hepatic function panel; Future  Severe vascular dementia without behavioral disturbance, psychotic disturbance, mood disturbance, or anxiety (HCC)- Will continue namenda .     Follow-up: Return in about 3 months (around 06/27/2024).  Debby Molt, MD

## 2024-03-27 NOTE — Patient Instructions (Signed)
 Bradycardia, Adult Bradycardia is a slower-than-normal heartbeat. A normal resting heart rate for an adult ranges from 60 to 100 beats per minute. With bradycardia, the resting heart rate is less than 60 beats per minute. Bradycardia can prevent enough oxygen  from reaching certain areas of your body when you are active. It can be serious if it keeps enough oxygen  from reaching your brain and other parts of your body. Bradycardia is not a problem for everyone. For some healthy adults, a slow resting heart rate is normal. What are the causes? This condition may be caused by: A problem with the heart, including: A problem with the heart's electrical system, such as a heart block. With a heart block, electrical signals between the chambers of the heart are partially or completely blocked, so they are not able to work as they should. A problem with the heart's natural pacemaker (sinus node). Heart disease. A heart attack. Heart damage. Lyme disease. A heart infection. A heart condition that is present at birth (congenital heart defect). Certain medicines that treat heart conditions. Certain conditions, such as hypothyroidism and obstructive sleep apnea. Problems with the balance of chemicals and other substances, like potassium, in the blood. Trauma. Radiation therapy. What increases the risk? You are more likely to develop this condition if you: Are age 30 or older. Have high blood pressure (hypertension), high cholesterol (hyperlipidemia), or diabetes. Drink heavily, use tobacco or nicotine products, or use drugs. What are the signs or symptoms? Symptoms of this condition include: Light-headedness. Feeling faint or fainting. Fatigue and weakness. Trouble with activity or exercise. Shortness of breath. Chest pain (angina). Drowsiness. Confusion. Dizziness. How is this diagnosed? This condition may be diagnosed based on: Your symptoms. Your medical history. A physical exam. During  the exam, your health care provider will listen to your heartbeat and check your pulse. To confirm the diagnosis, your health care provider may order tests, such as: Blood tests. An electrocardiogram (ECG). This test records the heart's electrical activity. The test can show how fast your heart is beating and whether the heartbeat is steady. A test in which you wear a portable device (event recorder or Holter monitor) to record your heart's electrical activity while you go about your day. An exercise test. How is this treated? Treatment for this condition depends on the cause of the condition and how severe your symptoms are. Treatment may involve: Treatment of the underlying condition. Changing your medicines or how much medicine you take. Having a small, battery-operated device called a pacemaker implanted under the skin. When bradycardia occurs, this device can be used to increase your heart rate and help your heart beat in a regular rhythm. Follow these instructions at home: Lifestyle Manage any health conditions that contribute to bradycardia as told by your health care provider. Follow a heart-healthy diet. A nutrition specialist (dietitian) can help educate you about healthy food options and changes. Follow an exercise program that is approved by your health care provider. Maintain a healthy weight. Try to reduce or manage your stress, such as with yoga or meditation. If you need help reducing stress, ask your health care provider. Do not use any products that contain nicotine or tobacco. These products include cigarettes, chewing tobacco, and vaping devices, such as e-cigarettes. If you need help quitting, ask your health care provider. Do not use illegal drugs. Alcohol  use If you drink alcohol : Limit how much you have to: 0-1 drink a day for women who are not pregnant. 0-2 drinks a day  for men. Know how much alcohol  is in a drink. In the [place] one drink equals one 12 oz bottle of  beer (355 mL), one 5 oz glass of wine (148 mL), or one 1 oz glass of hard liquor (44 mL). General instructions Take over-the-counter and prescription medicines only as told by your health care provider. Keep all follow-up visits. This is important. How is this prevented? In some cases, bradycardia may be prevented by: Treating underlying medical problems. Stopping behaviors or medicines that can trigger the condition. Contact a health care provider if: You feel light-headed or dizzy. You almost faint. You feel weak or are easily fatigued during physical activity. You experience confusion or have memory problems. Get help right away if: You faint. You have chest pains or an irregular heartbeat (palpitations). You have trouble breathing. These symptoms may represent a serious problem that is an emergency. Do not wait to see if the symptoms will go away. Get medical help right away. Call your local emergency services (911 in the [place] Do not drive yourself to the hospital. Summary Bradycardia is a slower-than-normal heartbeat. With bradycardia, the resting heart rate is less than 60 beats per minute. Treatment for this condition depends on the cause. Manage any health conditions that contribute to bradycardia as told by your health care provider. Do not use any products that contain nicotine or tobacco. These products include cigarettes, chewing tobacco, and vaping devices, such as e-cigarettes. Keep all follow-up visits. This is important. This information is not intended to replace advice given to you by your health care provider. Make sure you discuss any questions you have with your health care provider. Document Revised: 09/14/2020 Document Reviewed: 09/14/2020 Elsevier Patient Education  2024 [employer].

## 2024-04-20 ENCOUNTER — Ambulatory Visit: Payer: Self-pay

## 2024-04-20 NOTE — Telephone Encounter (Signed)
 FYI Only or Action Required?: FYI only for provider: appointment scheduled on 04/23/2024 at 7:50 AM.  Patient was last seen in primary care on 03/27/2024 by Joshua Debby CROME, MD.  Called Nurse Triage reporting Dizziness.  Symptoms began several weeks ago.  Interventions attempted: Rest, hydration, or home remedies.  Symptoms are: slightly worsening per son's report.  Triage Disposition: See PCP When Office is Open (Within 3 Days)  Patient/caregiver understands and will follow disposition?: Yes  Copied from CRM [id number]. Topic: Clinical - Red Word Triage >> Apr 20, 2024  9:16 AM Pinkey ORN wrote: Red Word that prompted transfer to Nurse Triage: Dizziness + Off-Balance Reason for Disposition  [1] MODERATE dizziness (e.g., interferes with normal activities) AND [2] has been evaluated by doctor (or NP/PA) for this  Answer Assessment - Initial Assessment Questions Son called with concerns that patient's dizziness is slightly gotten worse. States dizziness is moderate and worse when patient is going from sitting to standing. Reports patient hasn't fallen along with no other symptoms. Son is concerned and wants her to be evaluated again. Scheduled patient for 04/23/2024 at 7:50 AM.  1. DESCRIPTION: Describe your dizziness.     Son called stating patient is having increased dizziness and feeling off balanced. Feels swimmy headed 2. LIGHTHEADED: Do you feel lightheaded? (e.g., somewhat faint, woozy, weak upon standing)     yes 3. VERTIGO: Do you feel like either you or the room is spinning or tilting? (i.e., vertigo)     yes 4. SEVERITY: How bad is it?  Do you feel like you are going to faint? Can you stand and walk?     moderate 5. ONSET:  When did the dizziness begin?     Started a couple of weeks ago 6. AGGRAVATING FACTORS: Does anything make it worse? (e.g., standing, change in head position)     Going from sitting to standing.  7. HEART RATE: Can you tell me  your heart rate? How many beats in 15 seconds?  (Note: Not all patients can do this.)       NA 8. CAUSE: What do you think is causing the dizziness? (e.g., decreased fluids or food, diarrhea, emotional distress, heat exposure, new medicine, sudden standing, vomiting; unknown)     unknown 9. RECURRENT SYMPTOM: Have you had dizziness before? If Yes, ask: When was the last time? What happened that time?     yes 10. OTHER SYMPTOMS: Do you have any other symptoms? (e.g., fever, chest pain, vomiting, diarrhea, bleeding)       no  Protocols used: Dizziness - Lightheadedness-A-AH

## 2024-04-22 ENCOUNTER — Encounter: Payer: Self-pay | Admitting: Internal Medicine

## 2024-04-22 NOTE — Progress Notes (Unsigned)
    Subjective:    Patient ID: Brenda Bautista, female    DOB: 1946/04/15, 78 y.o.   MRN: [id number]      HPI Brenda Bautista is here for No chief complaint on file.  Dizziness x several weeks.  MRI brain 07/2023 reviewed.        Medications and allergies reviewed with patient and updated if appropriate.  Current Outpatient Medications on File Prior to Visit  Medication Sig Dispense Refill   albuterol  (PROVENTIL  HFA;VENTOLIN  HFA) 108 (90 Base) MCG/ACT inhaler Inhale 2 puffs into the lungs every 6 (six) hours as needed for wheezing or shortness of breath.     amLODipine  (NORVASC ) 5 MG tablet Take 1 tablet (5 mg total) by mouth daily. 30 tablet 0   atorvastatin  (LIPITOR ) 40 MG tablet Take 1 tablet (40 mg total) by mouth daily. 90 tablet 3   FARXIGA  10 MG TABS tablet TAKE 1 TABLET BY MOUTH DAILY  BEFORE BREAKFAST 100 tablet 2   memantine  (NAMENDA ) 5 MG tablet TAKE ONE TAB AT NIGHT FOR 2 WEEKS, INCREASE TO 1 TAB TWICE A DAY 180 tablet 4   mirtazapine  (REMERON ) 7.5 MG tablet TAKE 1 TABLET BY MOUTH AT BEDTIME. 90 tablet 4   SYMBICORT  80-4.5 MCG/ACT inhaler INHALE 2 PUFFS INTO THE LUNGS IN THE MORNING AND AT BEDTIME. 30.6 each 3   No current facility-administered medications on file prior to visit.    Review of Systems     Objective:  There were no vitals filed for this visit. BP Readings from Last 3 Encounters:  03/27/24 (!) 142/78  11/09/23 (!) 147/70  10/27/23 120/68   Wt Readings from Last 3 Encounters:  03/27/24 106 lb 9.6 oz (48.4 kg)  11/09/23 106 lb (48.1 kg)  10/27/23 108 lb (49 kg)   There is no height or weight on file to calculate BMI.    Physical Exam         Assessment & Plan:    See Problem List for Assessment and Plan of chronic medical problems.

## 2024-04-23 ENCOUNTER — Ambulatory Visit (INDEPENDENT_AMBULATORY_CARE_PROVIDER_SITE_OTHER): Admitting: Internal Medicine

## 2024-04-23 VITALS — BP 132/68 | HR 65 | Temp 98.1°F | Ht 59.0 in | Wt 106.0 lb

## 2024-04-23 DIAGNOSIS — F01C0 Vascular dementia, severe, without behavioral disturbance, psychotic disturbance, mood disturbance, and anxiety: Secondary | ICD-10-CM | POA: Diagnosis not present

## 2024-04-23 DIAGNOSIS — R42 Dizziness and giddiness: Secondary | ICD-10-CM | POA: Diagnosis not present

## 2024-04-23 DIAGNOSIS — I10 Essential (primary) hypertension: Secondary | ICD-10-CM | POA: Diagnosis not present

## 2024-04-23 DIAGNOSIS — R2689 Other abnormalities of gait and mobility: Secondary | ICD-10-CM | POA: Diagnosis not present

## 2024-04-23 NOTE — Patient Instructions (Addendum)
        Medications changes include :   hold memantine  for now and see if the dizziness improves.     A MRI of her brain was ordered and someone will call you to schedule an appointment.     Return if symptoms worsen or fail to improve.

## 2024-05-14 NOTE — Progress Notes (Signed)
 Assessment and Plan Assessment & Plan Dementia likely due to Alzheimer's Disease        Brenda Bautista is a very pleasant 78 y.o. RH female with a history ofhypertension, hyperlipidemia, anxiety, depression, asthma, vitamin D  deficiency seen today in follow up for memory loss. Patient is currently on memantine  10 mg bid, donepezil  currently on hold till bradycardia resolution. Recently her PCP decreased carvedilol with improvement in her pulse, thus, re-placing donepezil  in the regimen is indicated. ***.  This patient is accompanied in the office by her son*** who supplements the history.  Previous records as well as any outside records available were reviewed prior to todays visit. Patient was last seen on  11/09/23***. Memory is **. MMSE today is  /30. Patient is able to participate on ADLs, mood is***   Continue Memantine  10 mg twice daily. Side effects were discussed  Restart donepezil   at 5 mg daily, monitor for tolerance and for rate.  Recommend good control of cardiovascular risk factors.   Continue to control mood as per PCP Continue mirtazapine  7.5 mg nightly for sleep, side effects discussed  Discussed the use of AI scribe software for clinical note transcription with the patient, who gave verbal consent to proceed.  History of Present Illness     Any changes in memory since last visit? Gotten worse, especially with dates days and nights.  Forgets that she called and she calls 2-3 times for the same issue .  As before, she has difficulty with new information, recent conversations and names.  LTM is fair.  She enjoys doing chores around the house, going to [place], visiting her boyfriend, walking her dog and doing crossword puzzles.  repeats oneself?  Endorsed Disoriented when walking into a room? Denies    Leaving objects?  May misplace things but not in unusual places.  She may be less organized than before.   Wandering behavior?  denies   Any personality changes  since last visit?  Denies.  She has a history of depression after the death of her husband, always talks to herself -son says. She has been throwing stuff at her son, being more emotional. Any worsening depression?:  Denies.   Hallucinations or paranoia?  She continues to report that people come into the house and takes stuff for me.  Occasionally she feels the presence of someone, or senses people on the porch. Seizures? denies    Any sleep changes?  Sleeps well.  Sometimes reports that she may wake up several times during the night.  Denies vivid dreams, REM behavior or sleepwalking   Sleep apnea?   Denies.   Any hygiene concerns? Denies.  Independent of bathing and dressing?  Endorsed  Does the patient needs help with medications?   Son is in charge   Who is in charge of the finances? Son  is in charge     Any changes in appetite?  If she is alone she will not eat , she only eats when her son is around.  Does not drink enough water, instead she favors [employer].    Patient have trouble swallowing? Denies.  She has a history of GERD Does the patient cook?  Yes, over the last year may have forgotten some common recipes. Any headaches?   denies   Any vision changes? Denies  Chronic pain?  Endorsed, she has osteoporosis, followed by PCP. Ambulates with difficulty? Denies.    Recent falls or head injuries?  On 10/26/2023 lost  her balance and fell backwards injuring the left lower back, no head or neck injury, no loss of consciousness.  It was felt to be a contusion she has some back pain that was treated, she is feeling better now. Unilateral weakness, numbness or tingling? denies   Any tremors?  Denies   Any anosmia?  Denies   Any incontinence of urine?  Endorsed  Any bowel dysfunction?   Denies      Patient lives alone with son's supervision.  Her son has placed cameras at the doors for safety.    Does the patient drive?  She never drove    Initial visit August 08, 2023 How long did  patient have memory difficulties?  Son reports that her memory issues have been present for about 2 years, worse over the last 3 months.  Patient reports that she shows more difficulty remembering new information, recent conversations, names. LTM is ok in some aspects. Enjoys doing chores around the house, going to [employer], dance movement psychotherapist and crossword puzzles.  repeats oneself?  Endorsed, especially with appointments. Disoriented when walking into a room?  Patient denies    Leaving objects in unusual places?  She may lose her books, she is less organized than before.    Wandering behavior? denies   Any personality changes, or depression, anxiety? She has a history of depression after the death of her husband. Never had psychotherapist. She has always talked to herself.  Hallucinations or paranoia? She reports that people come to the house take stuff. Occasionally she has felt the presence of someone Seizures? denies    Any sleep changes?  Sleeps well. Son denies, he reports that at times she tells her son that she may wake up several times a night.  Denies vivid dreams, REM behavior or sleepwalking   Sleep apnea? Denies.   Any hygiene concerns?  Denies.   Independent of bathing and dressing? Endorsed  Does the patient need help with medications? Son  is in charge   Who is in charge of the finances? Son is in charge    Any changes in appetite?   Denies, but son states that her appetite is decreased. Does not drink enough water.    Patient have trouble swallowing?  GERD.  Does the patient cook? Yes, she may have forgotten common recipes for the last 6 months. Denies any kitchen accidents Any headaches?  Denies.   Chronic pain? She has osteoporosis, followed by PCP Ambulates with difficulty? Denies  Recent falls or head injuries? Denies.     Vision changes?  Denies any new issues.  Any strokelike symptoms? Denies.   Any tremors? Denies.  Any anosmia? Denies.   Any incontinence of urine? Denies.    Any bowel dysfunction? Denies.      Patient lives alone with son's supervision.  History of heavy alcohol intake? Denies.   History of heavy tobacco use? Denies.   Family history of dementia?  Denies  Does patient drive? Never drove   MRI of the brain February 2025, personally reviewed remarkable for mild generalized parenchymal volume loss, mild hippocampal atrophy,  mild chronic microvascular changes, no acute findings.          10/27/2023    1:15 PM  MMSE - Mini Mental State Exam  Not completed: Unable to complete      08/08/2023    8:00 AM  Montreal Cognitive Assessment   Visuospatial/ Executive (0/5) 1  Naming (0/3) 2  Attention: Read list of digits (0/2) 2  Attention: Read list of letters (0/1) 1  Attention: Serial 7 subtraction starting at 100 (0/3) 0  Language: Repeat phrase (0/2) 1  Language : Fluency (0/1) 0  Abstraction (0/2) 1  Delayed Recall (0/5) 0  Orientation (0/6) 3  Total 11  Adjusted Score (based on education) 12      Objective:    Neurological Exam:    VITALS:  There were no vitals filed for this visit.  GEN:  The patient appears stated age and is in NAD. HEENT:  Normocephalic, atraumatic.   Neurological examination:  General: NAD, well-groomed, appears stated age. Orientation: The patient is alert. Oriented to person, not to place and date Cranial nerves: There is good facial symmetry.The speech is fluent and clear. No aphasia or dysarthria. Fund of knowledge is appropriate. Recent and remote memory are impaired. Attention and concentration are reduced. Able to name objects and repeat phrases.  Hearing is intact to conversational tone.   Sensation: Sensation is intact to light touch throughout Motor: Strength is at least antigravity x4. DTR's 2/4 in UE/LE     Movement examination:  Tone: There is normal tone in the UE/LE Abnormal movements:  no tremor.  No myoclonus.  No asterixis.   Coordination:  There is no decremation with RAM's.  Normal finger to nose  Gait and Station: The patient has no*** difficulty arising out of a deep-seated chair without the use of the hands. The patient's stride length is good.  Gait is cautious and narrow.    Thank you for allowing us  the opportunity to participate in the care of this nice patient. Please do not hesitate to contact us  for any questions or concerns.   Total time spent on today's visit was *** minutes dedicated to this patient today, preparing to see patient, examining the patient, ordering tests and/or medications and counseling the patient, documenting clinical information in the EHR or other health record, independently interpreting results and communicating results to the patient/family, discussing treatment and goals, answering patient's questions and coordinating care.  Cc:  Joshua Debby CROME, MD  Camie Sevin 05/14/2024 1:58 PM

## 2024-05-15 ENCOUNTER — Encounter: Payer: Self-pay | Admitting: Physician Assistant

## 2024-05-15 ENCOUNTER — Ambulatory Visit: Admitting: Physician Assistant

## 2024-05-15 VITALS — BP 132/55 | HR 70 | Resp 20 | Ht 59.0 in | Wt 106.0 lb

## 2024-05-15 DIAGNOSIS — R413 Other amnesia: Secondary | ICD-10-CM

## 2024-05-23 ENCOUNTER — Other Ambulatory Visit: Payer: Self-pay | Admitting: Internal Medicine

## 2024-05-23 ENCOUNTER — Other Ambulatory Visit: Payer: Self-pay

## 2024-05-27 ENCOUNTER — Inpatient Hospital Stay: Admission: RE | Admit: 2024-05-27 | Discharge: 2024-05-27 | Attending: Internal Medicine

## 2024-05-27 DIAGNOSIS — R42 Dizziness and giddiness: Secondary | ICD-10-CM

## 2024-05-27 DIAGNOSIS — R2689 Other abnormalities of gait and mobility: Secondary | ICD-10-CM

## 2024-06-08 ENCOUNTER — Ambulatory Visit: Payer: Self-pay | Admitting: Internal Medicine

## 2024-06-27 ENCOUNTER — Ambulatory Visit: Admitting: Internal Medicine

## 2024-06-27 ENCOUNTER — Encounter: Payer: Self-pay | Admitting: Internal Medicine

## 2024-06-27 VITALS — BP 136/64 | HR 59 | Temp 98.4°F | Resp 16 | Ht 59.0 in | Wt 106.4 lb

## 2024-06-27 DIAGNOSIS — I10 Essential (primary) hypertension: Secondary | ICD-10-CM | POA: Diagnosis not present

## 2024-06-27 DIAGNOSIS — F419 Anxiety disorder, unspecified: Secondary | ICD-10-CM | POA: Insufficient documentation

## 2024-06-27 DIAGNOSIS — I700 Atherosclerosis of aorta: Secondary | ICD-10-CM | POA: Insufficient documentation

## 2024-06-27 DIAGNOSIS — Z0001 Encounter for general adult medical examination with abnormal findings: Secondary | ICD-10-CM

## 2024-06-27 DIAGNOSIS — J329 Chronic sinusitis, unspecified: Secondary | ICD-10-CM | POA: Insufficient documentation

## 2024-06-27 DIAGNOSIS — M19049 Primary osteoarthritis, unspecified hand: Secondary | ICD-10-CM | POA: Insufficient documentation

## 2024-06-27 DIAGNOSIS — I5032 Chronic diastolic (congestive) heart failure: Secondary | ICD-10-CM | POA: Diagnosis not present

## 2024-06-27 DIAGNOSIS — H269 Unspecified cataract: Secondary | ICD-10-CM | POA: Insufficient documentation

## 2024-06-27 DIAGNOSIS — F01C0 Vascular dementia, severe, without behavioral disturbance, psychotic disturbance, mood disturbance, and anxiety: Secondary | ICD-10-CM

## 2024-06-27 DIAGNOSIS — F329 Major depressive disorder, single episode, unspecified: Secondary | ICD-10-CM | POA: Insufficient documentation

## 2024-06-27 DIAGNOSIS — R7303 Prediabetes: Secondary | ICD-10-CM | POA: Insufficient documentation

## 2024-06-27 DIAGNOSIS — S63501S Unspecified sprain of right wrist, sequela: Secondary | ICD-10-CM | POA: Insufficient documentation

## 2024-06-27 DIAGNOSIS — D175 Benign lipomatous neoplasm of intra-abdominal organs: Secondary | ICD-10-CM | POA: Insufficient documentation

## 2024-06-27 DIAGNOSIS — E559 Vitamin D deficiency, unspecified: Secondary | ICD-10-CM | POA: Insufficient documentation

## 2024-06-27 NOTE — Progress Notes (Unsigned)
 "  Subjective:  Patient ID: Brenda Bautista, female    DOB: 06/17/1945  Age: 79 y.o. MRN: [id number]  CC: Hypertension, Hyperlipidemia, and Annual Exam   HPI Brenda Bautista presents for a CPX and f/up ---  Discussed the use of AI scribe software for clinical note transcription with the patient, who gave verbal consent to proceed.  History of Present Illness Brenda Bautista is an 79 year old female with dementia who presents with dizziness and lightheadedness. She is accompanied by her son.  She has been experiencing dizziness and lightheadedness for the past three to four days. These symptoms are intermittent and occur when she is with her son. No chest pain, shortness of breath, or recent falls.  She has a history of dementia and is on medication for her kidneys and blood pressure. Her son notes that she sometimes has tantrums, including slamming doors and becoming upset without apparent reason. She has expressed feelings of wishing she wasn't here but has not attempted to harm herself or others.  No recent weight loss, nausea, vomiting, diarrhea, or constipation. Her appetite remains stable as she eats what is provided to her.  She recently underwent an MRI, and her son is aware of her chronic small vessel disease.     Outpatient Medications Prior to Visit  Medication Sig Dispense Refill   albuterol  (PROVENTIL  HFA;VENTOLIN  HFA) 108 (90 Base) MCG/ACT inhaler Inhale 2 puffs into the lungs every 6 (six) hours as needed for wheezing or shortness of breath.     amLODipine  (NORVASC ) 5 MG tablet TAKE 1 TABLET (5 MG TOTAL) BY MOUTH DAILY. 90 tablet 3   atorvastatin  (LIPITOR ) 40 MG tablet Take 1 tablet (40 mg total) by mouth daily. 90 tablet 3   FARXIGA  10 MG TABS tablet TAKE 1 TABLET BY MOUTH DAILY  BEFORE BREAKFAST 100 tablet 2   memantine  (NAMENDA ) 5 MG tablet TAKE ONE TAB AT NIGHT FOR 2 WEEKS, INCREASE TO 1 TAB TWICE A DAY 180 tablet 4   mirtazapine  (REMERON ) 7.5 MG  tablet TAKE 1 TABLET BY MOUTH AT BEDTIME. 90 tablet 4   SYMBICORT  80-4.5 MCG/ACT inhaler INHALE 2 PUFFS INTO THE LUNGS IN THE MORNING AND AT BEDTIME. 30.6 each 3   No facility-administered medications prior to visit.    ROS Review of Systems  Constitutional:  Negative for chills, diaphoresis, fatigue and fever.  HENT: Negative.    Respiratory: Negative.  Negative for cough, chest tightness, shortness of breath and wheezing.   Cardiovascular:  Negative for chest pain, palpitations and leg swelling.  Gastrointestinal:  Negative for abdominal pain, constipation, diarrhea, nausea and vomiting.  Genitourinary: Negative.  Negative for difficulty urinating and dysuria.  Musculoskeletal: Negative.  Negative for arthralgias, joint swelling and myalgias.  Skin: Negative.   Neurological:  Positive for dizziness and light-headedness. Negative for syncope, speech difficulty and weakness.  Hematological:  Negative for adenopathy. Does not bruise/bleed easily.  Psychiatric/Behavioral:  Positive for agitation, behavioral problems, confusion and decreased concentration. Negative for sleep disturbance. The patient is not nervous/anxious.     Objective:  BP 136/64 (BP Location: Left Arm, Patient Position: Sitting, Cuff Size: Normal)   Pulse (!) 59   Temp 98.4 F (36.9 C) (Oral)   Resp 16   Ht 4' 11 (1.499 m)   Wt 106 lb 6.4 oz (48.3 kg)   SpO2 96%   BMI 21.49 kg/m   BP Readings from Last 3 Encounters:  06/27/24 136/64  05/15/24 (!) 132/55  04/23/24 132/68  Wt Readings from Last 3 Encounters:  06/27/24 106 lb 6.4 oz (48.3 kg)  05/15/24 106 lb (48.1 kg)  04/23/24 106 lb (48.1 kg)    Physical Exam Vitals reviewed.  Constitutional:      Appearance: Normal appearance.  HENT:     Mouth/Throat:     Mouth: Mucous membranes are moist.  Eyes:     General: No scleral icterus.    Conjunctiva/sclera: Conjunctivae normal.  Cardiovascular:     Rate and Rhythm: Normal rate and regular rhythm.      Heart sounds: No murmur heard.    No friction rub. No gallop.  Pulmonary:     Effort: Pulmonary effort is normal.     Breath sounds: No stridor. No wheezing, rhonchi or rales.  Abdominal:     General: Abdomen is flat.     Palpations: There is no mass.     Tenderness: There is no abdominal tenderness. There is no guarding.     Hernia: No hernia is present.  Musculoskeletal:        General: Normal range of motion.     Cervical back: Neck supple.     Right lower leg: No edema.     Left lower leg: No edema.  Lymphadenopathy:     Cervical: No cervical adenopathy.  Skin:    General: Skin is warm and dry.     Findings: No rash.  Neurological:     Mental Status: She is alert. Mental status is at baseline.  Psychiatric:        Attention and Perception: She is inattentive.        Mood and Affect: Mood and affect normal. Mood is not anxious.        Speech: Speech is delayed and tangential.        Behavior: Behavior is slowed.        Thought Content: Thought content normal. Thought content is not paranoid or delusional. Thought content does not include homicidal or suicidal ideation.        Cognition and Memory: Cognition is impaired. Memory is impaired. She exhibits impaired recent memory and impaired remote memory.     Lab Results  Component Value Date   WBC 12.4 (H) 03/27/2024   HGB 14.6 03/27/2024   HCT 43.7 03/27/2024   PLT 321.0 03/27/2024   GLUCOSE 88 03/27/2024   CHOL 170 09/26/2023   TRIG 148.0 09/26/2023   HDL 73.60 09/26/2023   LDLCALC 67 09/26/2023   ALT 29 03/27/2024   AST 39 (H) 03/27/2024   NA 142 03/27/2024   K 3.9 03/27/2024   CL 100 03/27/2024   CREATININE 1.15 03/27/2024   BUN 21 03/27/2024   CO2 30 03/27/2024   TSH 4.66 03/27/2024   INR 1.15 12/18/2015   HGBA1C 5.8 11/22/2022    MR Brain Wo Contrast Result Date: 06/07/2024 EXAM: MRI BRAIN WITHOUT CONTRAST 05/27/2024 11:40:26 AM TECHNIQUE: Multiplanar multisequence MRI of the head/brain was  performed without the administration of intravenous contrast. COMPARISON: MR Head Without then IV contrast 07/22/2023. CLINICAL HISTORY: Central vertigo, poor balance x few months. FINDINGS: BRAIN AND VENTRICLES: No acute infarct. No acute intracranial hemorrhage. No mass. No midline shift. No hydrocephalus. Multifocal hyperintense T2-weighted signal within the cerebral white matter, most commonly due to chronic small vessel disease. Unchanged chronic microhemorrhage in the left periventricular white matter. The sella is unremarkable. Normal flow voids. ORBITS: No acute abnormality. SINUSES AND MASTOIDS: No acute abnormality. BONES AND SOFT TISSUES: Normal marrow signal. No acute  soft tissue abnormality. IMPRESSION: 1. No acute intracranial abnormality. Electronically signed by: Franky Stanford MD 06/07/2024 08:26 PM EST RP Workstation: [id number]    Assessment & Plan:  Severe vascular dementia without behavioral disturbance, psychotic disturbance, mood disturbance, or anxiety (HCC) -     AMB Referral VBCI Care Management  Encounter for general adult medical examination with abnormal findings- Exam completed, labs reviewed, vaccines reviewed , no cancer screenings indicated, pt ed material was given.   Chronic diastolic CHF (congestive heart failure) (HCC)- Normal volume status.  Essential hypertension- BP is well controlled.     Follow-up: Return in about 6 months (around 12/25/2024).  Debby Molt, MD "

## 2024-06-27 NOTE — Patient Instructions (Signed)

## 2024-06-28 ENCOUNTER — Telehealth: Payer: Self-pay | Admitting: Internal Medicine

## 2024-06-28 NOTE — Progress Notes (Signed)
 Complex Care Management Note  Care Guide Note 06/28/2024 Name: Brenda Bautista MRN: [id number] DOB: 02-12-1946  Brenda Bautista is a 79 y.o. year old female who sees Joshua Debby CROME, MD for primary care. I reached out to Comer GORMAN Like by phone today to offer complex care management services.  [name] was given information about Complex Care Management services today including:   The Complex Care Management services include support from the care team which includes your Nurse Care Manager, Clinical Social Worker, or Pharmacist.  The Complex Care Management team is here to help remove barriers to the health concerns and goals most important to you. Complex Care Management services are voluntary, and the patient may decline or stop services at any time by request to their care team member.   Complex Care Management Consent Status: Patient agreed to services and verbal consent obtained.   Follow up plan:  Telephone appointment with complex care management team member scheduled for:  07/04/24  Encounter Outcome:  Patient Scheduled

## 2024-07-04 ENCOUNTER — Other Ambulatory Visit: Payer: Self-pay

## 2024-07-04 NOTE — Patient Instructions (Signed)
 Visit Information  LCSW is attaching dementia resources for caregiver which should have been included in previous wrap up.  Olam Ally, MSW, LCSW [place]  [facility], [facility] Licensed Clinical Social Worker Direct Dial: [phone]

## 2024-07-04 NOTE — Patient Instructions (Signed)
 Visit Information  Thank you for taking time to visit with me today. Please don't hesitate to contact me if I can be of assistance to you before our next scheduled appointment.  Our next appointment is by telephone on 07/18/2024 at 1:00 PM Please call the care guide team at [phone] if you need to cancel or reschedule your appointment.   Following is a copy of your care plan:   Goals Addressed             This Visit's Progress    VBCI Social Work Care Plan       Problems:   Care Coordination needs related to Dementia: Dementia with anxiety  CSW Clinical Goal(s):   Over the next 90 days the Caregiver will work with Child Psychotherapist to address concerns related to dementia as evidenced by caregiver possibly being linked to a support group and receiving dementia resources.  Over the next two weeks, caregiver will review educational material on on dementia and decide if support group a viable option.  Interventions:  Dementia Care:   Current level of care: Home with other family or significant other(s): family member: Patient lives with either son or daughter.  Evaluation of patient safety in current living environment and review of Dementia resources and support . ADL's Assessed needs, level of care concerns, how currently meeting needs and barriers to care Motivational Interviewing employed Problem Solving/Task Center strategies reviewed Solution focused intervention provided Mental Health interventions: Active listening / Reflection utilized Caregiver stress acknowledged :LCSW inquired if caregiver would be open to a support group. LCSW strategized viable options with care giver concerns with patient safety when she is not him or his sister. Depression screen reviewed Discussed caregiver resources and support:  Emotional Support Provided Motivational Interviewing employed PHQ2/PHQ9 completed- LCSW reviewed questions with caregiver again on today's call and discussed  results. GAD 7 reviewed again with caregiver and discussed results. Caregiver reports that patient is not seeking treatment for mental health concerns and considers them to be manageable.  Patient Goals/Self-Care Activities:  Coordinate with LCSW  to assist with dementia resources and caregiver resources..  Plan:   Telephone follow up appointment with care management team member scheduled for:  07/18/2024 at 1:00 PM        Please call 911 if you are experiencing a Mental Health or Behavioral Health Crisis or need someone to talk to.  Patient's son verbalized understanding of Care plan and visit instructions communicated this visit Olam Ally, MSW, LCSW [place]  [facility] Licensed Clinical Social Worker Direct Dial: [phone]

## 2024-07-04 NOTE — Patient Outreach (Signed)
 Complex Care Management   Visit Note  07/04/2024  Name:  Brenda Bautista MRN: [id number] DOB: [birth year]  Situation: Referral received for Complex Care Management related to Dementia I obtained verbal consent from Caregiver.  Visit completed with Caregiver  on the phone. Caregiver is seeking dementia and caregiver resources.  Background:   Past Medical History:  Diagnosis Date   Allergic rhinitis    Anxiety    Arthritis    Asthma    Cataract    Depression    Hyperlipemia    Hypertension    Sinusitis    Vitamin D  deficiency     Assessment: Patient Reported Symptoms:  Cognitive Cognitive Status: Struggling with memory recall, Confused or disoriented, Requires Assistance Decision Making, Normal speech and language skills, Alert and oriented to person, place, and time Cognitive/Intellectual Conditions Management [RPT]: None reported or documented in medical history or problem list   Health Maintenance Behaviors: Annual physical exam  Neurological Neurological Review of Symptoms: Dizziness Neurological Management Strategies: Adequate rest Neurological Comment: Patient had a MRI and dizziness may be related to Dementia  HEENT HEENT Symptoms Reported: No symptoms reported      Cardiovascular Cardiovascular Symptoms Reported: No symptoms reported Does patient have uncontrolled Hypertension?: No    Respiratory Respiratory Symptoms Reported: Shortness of breath Other Respiratory Symptoms: Patient uses an enhaler Respiratory Management Strategies: Asthma action plan  Endocrine Endocrine Symptoms Reported: No symptoms reported Is patient diabetic?: No    Gastrointestinal Gastrointestinal Symptoms Reported: Change in appetite Additional Gastrointestinal Details: Caregiver reports that meals must be prepared      Genitourinary Genitourinary Symptoms Reported: No symptoms reported    Integumentary Integumentary Symptoms Reported: No symptoms reported    Musculoskeletal  Musculoskelatal Symptoms Reviewed: No symptoms reported   Falls in the past year?: Yes Number of falls in past year: 1 or less Was there an injury with Fall?: No Fall Risk Category Calculator: 1 Patient Fall Risk Level: Low Fall Risk Patient at Risk for Falls Due to: History of fall(s), 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 Major Change/Loss/Stressor/Fears (CP): Medical condition, self Quality of Family Relationships: involved, supportive Do you feel physically threatened by others?: No    07/04/2024    PHQ2-9 Depression Screening   Little interest or pleasure in doing things Not at all  Feeling down, depressed, or hopeless Several days  PHQ-2 - Total Score 1  Trouble falling or staying asleep, or sleeping too much More than half the days  Feeling tired or having little energy Several days  Poor appetite or overeating  Several days  Feeling bad about yourself - or that you are a failure or have let yourself or your family down Not at all  Trouble concentrating on things, such as reading the newspaper or watching television Not at all  Moving or speaking so slowly that other people could have noticed.  Or the opposite - being so fidgety or restless that you have been moving around a lot more than usual Several days  Thoughts that you would be better off dead, or hurting yourself in some way Not at  all  PHQ2-9 Total Score 6  If you checked off any problems, how difficult have these problems made it for  you to do your work, take care of things at home, or get along with other people Not difficult at all  Depression Interventions/Treatment      There were no vitals filed for this visit. Pain Scale: 0-10 Pain Score: 0-No pain  Medications Reviewed Today     Reviewed by Sherren Olam BRAVO, LCSW (Social Worker) on 07/04/24 at 1312  Med List Status: <None>   Medication Order Taking? Sig Documenting Provider Last Dose Status Informant  albuterol  (PROVENTIL  HFA;VENTOLIN  HFA) 108 (90 Base) MCG/ACT inhaler [id number] Yes Inhale 2 puffs into the lungs every 6 (six) hours as needed for wheezing or shortness of breath. [provider]  Active Self  amLODipine  (NORVASC ) 5 MG tablet [id number] Yes TAKE 1 TABLET (5 MG TOTAL) BY MOUTH DAILY. Joshua Debby CROME, MD  Active   atorvastatin  (LIPITOR ) 40 MG tablet [id number] Yes Take 1 tablet (40 mg total) by mouth daily. Norleen Lynwood ORN, MD  Active   FARXIGA  10 MG TABS tablet [id number] Yes TAKE 1 TABLET BY MOUTH DAILY  BEFORE OFILIA Joshua Debby CROME, MD  Active   memantine  (NAMENDA ) 5 MG tablet [id number] Yes TAKE ONE TAB AT NIGHT FOR 2 WEEKS, INCREASE TO 1 TAB TWICE A DAY Wertman, Sara E, PA-C  Active   mirtazapine  (REMERON ) 7.5 MG tablet [id number] Yes TAKE 1 TABLET BY MOUTH AT BEDTIME. Wertman, Sara E, PA-C  Active   SYMBICORT  80-4.5 MCG/ACT inhaler [id number] Yes INHALE 2 PUFFS INTO THE LUNGS IN THE MORNING AND AT BEDTIME. Mannam, Praveen, MD  Active             Recommendation:   Continue Current Plan of Care Caregiver will review dementia resources  Follow Up Plan:   Telephone follow-up 07/18/2024 at 1:00 PM  Olam Sherren, MSW, LCSW [place]  [facility], [facility] Licensed Clinical Social Worker Direct Dial: [phone]

## 2024-07-18 ENCOUNTER — Telehealth

## 2024-11-01 ENCOUNTER — Ambulatory Visit

## 2024-11-14 ENCOUNTER — Ambulatory Visit: Admitting: Physician Assistant

## 2024-12-25 ENCOUNTER — Ambulatory Visit: Admitting: Internal Medicine
